# Patient Record
Sex: Male | Born: 1935 | Race: Black or African American | Hispanic: No | Smoking: Former smoker
Health system: Southern US, Community
[De-identification: ages and names within clinical notes are randomized; demographics above are authoritative.]

## PROBLEM LIST (undated history)

## (undated) DIAGNOSIS — S065X9A Traumatic subdural hemorrhage with loss of consciousness of unspecified duration, initial encounter: Secondary | ICD-10-CM

## (undated) DIAGNOSIS — S065XAA Traumatic subdural hemorrhage with loss of consciousness status unknown, initial encounter: Secondary | ICD-10-CM

## (undated) DIAGNOSIS — F32A Depression, unspecified: Secondary | ICD-10-CM

## (undated) DIAGNOSIS — M1990 Unspecified osteoarthritis, unspecified site: Secondary | ICD-10-CM

## (undated) DIAGNOSIS — I4891 Unspecified atrial fibrillation: Secondary | ICD-10-CM

## (undated) DIAGNOSIS — J069 Acute upper respiratory infection, unspecified: Secondary | ICD-10-CM

## (undated) DIAGNOSIS — K5790 Diverticulosis of intestine, part unspecified, without perforation or abscess without bleeding: Secondary | ICD-10-CM

## (undated) DIAGNOSIS — I10 Essential (primary) hypertension: Secondary | ICD-10-CM

## (undated) DIAGNOSIS — H269 Unspecified cataract: Secondary | ICD-10-CM

## (undated) DIAGNOSIS — I714 Abdominal aortic aneurysm, without rupture, unspecified: Secondary | ICD-10-CM

## (undated) DIAGNOSIS — I428 Other cardiomyopathies: Secondary | ICD-10-CM

## (undated) DIAGNOSIS — F329 Major depressive disorder, single episode, unspecified: Secondary | ICD-10-CM

## (undated) DIAGNOSIS — K219 Gastro-esophageal reflux disease without esophagitis: Secondary | ICD-10-CM

## (undated) DIAGNOSIS — I509 Heart failure, unspecified: Secondary | ICD-10-CM

## (undated) DIAGNOSIS — K5900 Constipation, unspecified: Secondary | ICD-10-CM

## (undated) DIAGNOSIS — R001 Bradycardia, unspecified: Secondary | ICD-10-CM

## (undated) HISTORY — PX: TONSILLECTOMY: SUR1361

## (undated) HISTORY — PX: OTHER SURGICAL HISTORY: SHX169

## (undated) HISTORY — PX: BRAIN SURGERY: SHX531

## (undated) HISTORY — PX: EYE SURGERY: SHX253

## (undated) HISTORY — PX: HERNIA REPAIR: SHX51

---

## 2000-07-09 ENCOUNTER — Encounter: Admission: RE | Admit: 2000-07-09 | Discharge: 2000-07-09 | Payer: Self-pay | Admitting: Cardiology

## 2000-07-09 ENCOUNTER — Encounter: Payer: Self-pay | Admitting: Cardiology

## 2001-01-23 ENCOUNTER — Ambulatory Visit (HOSPITAL_COMMUNITY): Admission: RE | Admit: 2001-01-23 | Discharge: 2001-01-23 | Payer: Self-pay | Admitting: Cardiology

## 2001-01-23 ENCOUNTER — Encounter: Payer: Self-pay | Admitting: Cardiology

## 2001-04-07 ENCOUNTER — Encounter (INDEPENDENT_AMBULATORY_CARE_PROVIDER_SITE_OTHER): Payer: Self-pay | Admitting: Specialist

## 2001-04-07 ENCOUNTER — Other Ambulatory Visit: Admission: RE | Admit: 2001-04-07 | Discharge: 2001-04-07 | Payer: Self-pay | Admitting: Urology

## 2001-07-16 ENCOUNTER — Ambulatory Visit (HOSPITAL_COMMUNITY): Admission: RE | Admit: 2001-07-16 | Discharge: 2001-07-16 | Payer: Self-pay | Admitting: *Deleted

## 2001-07-21 ENCOUNTER — Encounter: Payer: Self-pay | Admitting: *Deleted

## 2001-07-21 ENCOUNTER — Ambulatory Visit (HOSPITAL_COMMUNITY): Admission: RE | Admit: 2001-07-21 | Discharge: 2001-07-21 | Payer: Self-pay | Admitting: *Deleted

## 2001-08-31 ENCOUNTER — Emergency Department: Admission: EM | Admit: 2001-08-31 | Discharge: 2001-08-31 | Payer: Self-pay | Admitting: *Deleted

## 2002-10-11 ENCOUNTER — Encounter: Payer: Self-pay | Admitting: Cardiology

## 2002-10-11 ENCOUNTER — Encounter: Admission: RE | Admit: 2002-10-11 | Discharge: 2002-10-11 | Payer: Self-pay | Admitting: Cardiology

## 2003-01-20 ENCOUNTER — Inpatient Hospital Stay (HOSPITAL_COMMUNITY): Admission: EM | Admit: 2003-01-20 | Discharge: 2003-01-26 | Payer: Self-pay | Admitting: *Deleted

## 2003-01-20 ENCOUNTER — Encounter: Payer: Self-pay | Admitting: Cardiology

## 2003-01-21 ENCOUNTER — Encounter: Payer: Self-pay | Admitting: Cardiology

## 2003-01-22 ENCOUNTER — Encounter: Payer: Self-pay | Admitting: Cardiology

## 2003-01-24 ENCOUNTER — Encounter: Payer: Self-pay | Admitting: Cardiology

## 2003-03-03 ENCOUNTER — Encounter: Payer: Self-pay | Admitting: Emergency Medicine

## 2003-03-03 ENCOUNTER — Emergency Department (HOSPITAL_COMMUNITY): Admission: EM | Admit: 2003-03-03 | Discharge: 2003-03-03 | Payer: Self-pay | Admitting: Emergency Medicine

## 2003-04-02 ENCOUNTER — Encounter: Payer: Self-pay | Admitting: Emergency Medicine

## 2003-04-02 ENCOUNTER — Emergency Department (HOSPITAL_COMMUNITY): Admission: EM | Admit: 2003-04-02 | Discharge: 2003-04-02 | Payer: Self-pay | Admitting: Emergency Medicine

## 2003-06-23 ENCOUNTER — Emergency Department (HOSPITAL_COMMUNITY): Admission: EM | Admit: 2003-06-23 | Discharge: 2003-06-23 | Payer: Self-pay | Admitting: Emergency Medicine

## 2003-06-23 ENCOUNTER — Encounter: Payer: Self-pay | Admitting: Emergency Medicine

## 2003-09-17 ENCOUNTER — Encounter: Payer: Self-pay | Admitting: Emergency Medicine

## 2003-09-17 ENCOUNTER — Encounter: Payer: Self-pay | Admitting: Cardiology

## 2003-09-17 ENCOUNTER — Inpatient Hospital Stay (HOSPITAL_COMMUNITY): Admission: EM | Admit: 2003-09-17 | Discharge: 2003-09-22 | Payer: Self-pay | Admitting: Emergency Medicine

## 2003-10-30 ENCOUNTER — Emergency Department (HOSPITAL_COMMUNITY): Admission: AD | Admit: 2003-10-30 | Discharge: 2003-10-30 | Payer: Self-pay | Admitting: Emergency Medicine

## 2003-12-06 ENCOUNTER — Encounter: Admission: RE | Admit: 2003-12-06 | Discharge: 2003-12-06 | Payer: Self-pay | Admitting: Cardiology

## 2004-01-04 ENCOUNTER — Encounter: Admission: RE | Admit: 2004-01-04 | Discharge: 2004-01-04 | Payer: Self-pay | Admitting: Cardiology

## 2004-01-18 ENCOUNTER — Ambulatory Visit (HOSPITAL_COMMUNITY): Admission: RE | Admit: 2004-01-18 | Discharge: 2004-01-18 | Payer: Self-pay | Admitting: Cardiology

## 2004-01-21 ENCOUNTER — Encounter: Admission: RE | Admit: 2004-01-21 | Discharge: 2004-01-21 | Payer: Self-pay | Admitting: Cardiology

## 2004-01-21 ENCOUNTER — Ambulatory Visit (HOSPITAL_COMMUNITY): Admission: RE | Admit: 2004-01-21 | Discharge: 2004-01-21 | Payer: Self-pay | Admitting: Internal Medicine

## 2004-02-07 ENCOUNTER — Encounter: Admission: RE | Admit: 2004-02-07 | Discharge: 2004-02-07 | Payer: Self-pay | Admitting: Cardiology

## 2004-02-07 ENCOUNTER — Ambulatory Visit (HOSPITAL_COMMUNITY): Admission: RE | Admit: 2004-02-07 | Discharge: 2004-02-07 | Payer: Self-pay | Admitting: Cardiology

## 2004-07-02 ENCOUNTER — Emergency Department (HOSPITAL_COMMUNITY): Admission: EM | Admit: 2004-07-02 | Discharge: 2004-07-02 | Payer: Self-pay | Admitting: Emergency Medicine

## 2004-10-03 ENCOUNTER — Encounter: Admission: RE | Admit: 2004-10-03 | Discharge: 2004-10-03 | Payer: Self-pay | Admitting: Cardiology

## 2004-10-17 ENCOUNTER — Encounter: Admission: RE | Admit: 2004-10-17 | Discharge: 2004-10-17 | Payer: Self-pay | Admitting: Cardiology

## 2005-01-31 ENCOUNTER — Emergency Department (HOSPITAL_COMMUNITY): Admission: EM | Admit: 2005-01-31 | Discharge: 2005-01-31 | Payer: Self-pay | Admitting: Emergency Medicine

## 2005-02-13 ENCOUNTER — Encounter: Admission: RE | Admit: 2005-02-13 | Discharge: 2005-02-13 | Payer: Self-pay | Admitting: Cardiology

## 2005-04-24 ENCOUNTER — Emergency Department (HOSPITAL_COMMUNITY): Admission: EM | Admit: 2005-04-24 | Discharge: 2005-04-24 | Payer: Self-pay | Admitting: Emergency Medicine

## 2005-05-08 ENCOUNTER — Encounter: Admission: RE | Admit: 2005-05-08 | Discharge: 2005-05-08 | Payer: Self-pay | Admitting: Cardiology

## 2005-05-24 ENCOUNTER — Encounter: Admission: RE | Admit: 2005-05-24 | Discharge: 2005-05-24 | Payer: Self-pay | Admitting: Cardiology

## 2005-07-13 ENCOUNTER — Inpatient Hospital Stay (HOSPITAL_COMMUNITY): Admission: EM | Admit: 2005-07-13 | Discharge: 2005-07-17 | Payer: Self-pay | Admitting: Emergency Medicine

## 2005-08-30 ENCOUNTER — Emergency Department (HOSPITAL_COMMUNITY): Admission: EM | Admit: 2005-08-30 | Discharge: 2005-08-31 | Payer: Self-pay | Admitting: Emergency Medicine

## 2005-09-01 ENCOUNTER — Emergency Department (HOSPITAL_COMMUNITY): Admission: EM | Admit: 2005-09-01 | Discharge: 2005-09-02 | Payer: Self-pay | Admitting: Emergency Medicine

## 2005-09-03 ENCOUNTER — Inpatient Hospital Stay (HOSPITAL_COMMUNITY): Admission: EM | Admit: 2005-09-03 | Discharge: 2005-09-17 | Payer: Self-pay | Admitting: Emergency Medicine

## 2005-09-03 ENCOUNTER — Ambulatory Visit: Payer: Self-pay | Admitting: Physical Medicine & Rehabilitation

## 2005-09-03 ENCOUNTER — Ambulatory Visit (HOSPITAL_COMMUNITY): Admission: RE | Admit: 2005-09-03 | Discharge: 2005-09-03 | Payer: Self-pay | Admitting: Emergency Medicine

## 2005-09-04 ENCOUNTER — Encounter (INDEPENDENT_AMBULATORY_CARE_PROVIDER_SITE_OTHER): Payer: Self-pay | Admitting: *Deleted

## 2005-09-17 ENCOUNTER — Inpatient Hospital Stay (HOSPITAL_COMMUNITY)
Admission: RE | Admit: 2005-09-17 | Discharge: 2005-09-28 | Payer: Self-pay | Admitting: Physical Medicine & Rehabilitation

## 2005-11-21 ENCOUNTER — Ambulatory Visit (HOSPITAL_COMMUNITY): Admission: RE | Admit: 2005-11-21 | Discharge: 2005-11-21 | Payer: Self-pay | Admitting: Neurosurgery

## 2006-02-22 ENCOUNTER — Ambulatory Visit (HOSPITAL_COMMUNITY): Admission: RE | Admit: 2006-02-22 | Discharge: 2006-02-22 | Payer: Self-pay | Admitting: Neurosurgery

## 2006-03-06 ENCOUNTER — Inpatient Hospital Stay (HOSPITAL_COMMUNITY): Admission: RE | Admit: 2006-03-06 | Discharge: 2006-03-08 | Payer: Self-pay | Admitting: Neurosurgery

## 2006-04-04 ENCOUNTER — Encounter: Admission: RE | Admit: 2006-04-04 | Discharge: 2006-04-04 | Payer: Self-pay | Admitting: Cardiology

## 2006-04-18 ENCOUNTER — Encounter: Admission: RE | Admit: 2006-04-18 | Discharge: 2006-04-18 | Payer: Self-pay | Admitting: Cardiology

## 2006-10-15 ENCOUNTER — Encounter: Admission: RE | Admit: 2006-10-15 | Discharge: 2006-10-15 | Payer: Self-pay | Admitting: Cardiology

## 2007-02-07 ENCOUNTER — Encounter: Admission: RE | Admit: 2007-02-07 | Discharge: 2007-02-07 | Payer: Self-pay | Admitting: Cardiology

## 2007-05-12 ENCOUNTER — Encounter: Admission: RE | Admit: 2007-05-12 | Discharge: 2007-05-12 | Payer: Self-pay | Admitting: Cardiology

## 2007-07-18 ENCOUNTER — Encounter: Admission: RE | Admit: 2007-07-18 | Discharge: 2007-07-18 | Payer: Self-pay | Admitting: Cardiology

## 2007-09-24 ENCOUNTER — Encounter: Admission: RE | Admit: 2007-09-24 | Discharge: 2007-09-24 | Payer: Self-pay | Admitting: Cardiology

## 2007-11-11 ENCOUNTER — Encounter: Admission: RE | Admit: 2007-11-11 | Discharge: 2007-11-11 | Payer: Self-pay | Admitting: Cardiology

## 2007-12-29 ENCOUNTER — Encounter: Admission: RE | Admit: 2007-12-29 | Discharge: 2007-12-29 | Payer: Self-pay | Admitting: Cardiology

## 2008-03-25 ENCOUNTER — Encounter: Admission: RE | Admit: 2008-03-25 | Discharge: 2008-03-25 | Payer: Self-pay | Admitting: Cardiology

## 2008-06-22 ENCOUNTER — Encounter: Admission: RE | Admit: 2008-06-22 | Discharge: 2008-06-22 | Payer: Self-pay | Admitting: Cardiology

## 2008-08-01 ENCOUNTER — Emergency Department (HOSPITAL_COMMUNITY): Admission: EM | Admit: 2008-08-01 | Discharge: 2008-08-01 | Payer: Self-pay | Admitting: Emergency Medicine

## 2008-08-08 ENCOUNTER — Emergency Department (HOSPITAL_COMMUNITY): Admission: EM | Admit: 2008-08-08 | Discharge: 2008-08-08 | Payer: Self-pay | Admitting: Emergency Medicine

## 2008-10-14 ENCOUNTER — Encounter: Admission: RE | Admit: 2008-10-14 | Discharge: 2008-10-14 | Payer: Self-pay | Admitting: Cardiology

## 2009-01-07 ENCOUNTER — Encounter: Admission: RE | Admit: 2009-01-07 | Discharge: 2009-01-07 | Payer: Self-pay | Admitting: Cardiology

## 2009-03-05 ENCOUNTER — Emergency Department (HOSPITAL_COMMUNITY): Admission: EM | Admit: 2009-03-05 | Discharge: 2009-03-05 | Payer: Self-pay | Admitting: Emergency Medicine

## 2009-04-14 ENCOUNTER — Encounter: Admission: RE | Admit: 2009-04-14 | Discharge: 2009-04-14 | Payer: Self-pay | Admitting: Cardiology

## 2009-06-30 ENCOUNTER — Encounter: Admission: RE | Admit: 2009-06-30 | Discharge: 2009-06-30 | Payer: Self-pay | Admitting: Cardiology

## 2009-07-08 ENCOUNTER — Emergency Department (HOSPITAL_COMMUNITY): Admission: EM | Admit: 2009-07-08 | Discharge: 2009-07-08 | Payer: Self-pay | Admitting: Emergency Medicine

## 2009-09-04 ENCOUNTER — Emergency Department (HOSPITAL_COMMUNITY): Admission: EM | Admit: 2009-09-04 | Discharge: 2009-09-04 | Payer: Self-pay | Admitting: Emergency Medicine

## 2009-09-29 ENCOUNTER — Encounter: Admission: RE | Admit: 2009-09-29 | Discharge: 2009-09-29 | Payer: Self-pay | Admitting: Cardiology

## 2009-10-15 ENCOUNTER — Emergency Department (HOSPITAL_COMMUNITY): Admission: EM | Admit: 2009-10-15 | Discharge: 2009-10-15 | Payer: Self-pay | Admitting: Emergency Medicine

## 2009-12-25 ENCOUNTER — Emergency Department (HOSPITAL_COMMUNITY): Admission: EM | Admit: 2009-12-25 | Discharge: 2009-12-25 | Payer: Self-pay | Admitting: Emergency Medicine

## 2009-12-29 ENCOUNTER — Encounter: Admission: RE | Admit: 2009-12-29 | Discharge: 2009-12-29 | Payer: Self-pay | Admitting: Cardiology

## 2010-03-09 ENCOUNTER — Encounter: Admission: RE | Admit: 2010-03-09 | Discharge: 2010-03-09 | Payer: Self-pay | Admitting: Cardiology

## 2010-06-22 ENCOUNTER — Encounter: Admission: RE | Admit: 2010-06-22 | Discharge: 2010-06-22 | Payer: Self-pay | Admitting: Cardiology

## 2010-08-14 ENCOUNTER — Emergency Department (HOSPITAL_COMMUNITY): Admission: EM | Admit: 2010-08-14 | Discharge: 2010-08-14 | Payer: Self-pay | Admitting: Emergency Medicine

## 2010-09-06 ENCOUNTER — Encounter: Admission: RE | Admit: 2010-09-06 | Discharge: 2010-09-06 | Payer: Self-pay | Admitting: Cardiology

## 2010-11-16 ENCOUNTER — Emergency Department (HOSPITAL_COMMUNITY): Admission: EM | Admit: 2010-11-16 | Discharge: 2010-11-16 | Payer: Self-pay | Admitting: Emergency Medicine

## 2010-12-14 ENCOUNTER — Encounter
Admission: RE | Admit: 2010-12-14 | Discharge: 2010-12-14 | Payer: Self-pay | Source: Home / Self Care | Attending: Cardiology | Admitting: Cardiology

## 2011-02-17 ENCOUNTER — Emergency Department (HOSPITAL_COMMUNITY)
Admission: EM | Admit: 2011-02-17 | Discharge: 2011-02-17 | Disposition: A | Discharge status: Home / Self Care | Payer: Medicare Other | Attending: Emergency Medicine | Admitting: Emergency Medicine

## 2011-02-17 ENCOUNTER — Emergency Department (HOSPITAL_COMMUNITY): Payer: Medicare Other

## 2011-02-17 DIAGNOSIS — Z7982 Long term (current) use of aspirin: Secondary | ICD-10-CM | POA: Insufficient documentation

## 2011-02-17 DIAGNOSIS — R109 Unspecified abdominal pain: Secondary | ICD-10-CM | POA: Insufficient documentation

## 2011-02-17 DIAGNOSIS — I509 Heart failure, unspecified: Secondary | ICD-10-CM | POA: Insufficient documentation

## 2011-02-17 DIAGNOSIS — I10 Essential (primary) hypertension: Secondary | ICD-10-CM | POA: Insufficient documentation

## 2011-02-17 DIAGNOSIS — R110 Nausea: Secondary | ICD-10-CM | POA: Insufficient documentation

## 2011-02-17 DIAGNOSIS — E785 Hyperlipidemia, unspecified: Secondary | ICD-10-CM | POA: Insufficient documentation

## 2011-02-17 DIAGNOSIS — I4891 Unspecified atrial fibrillation: Secondary | ICD-10-CM | POA: Insufficient documentation

## 2011-02-17 DIAGNOSIS — K219 Gastro-esophageal reflux disease without esophagitis: Secondary | ICD-10-CM | POA: Insufficient documentation

## 2011-02-17 DIAGNOSIS — K5900 Constipation, unspecified: Secondary | ICD-10-CM | POA: Insufficient documentation

## 2011-02-17 DIAGNOSIS — Z79899 Other long term (current) drug therapy: Secondary | ICD-10-CM | POA: Insufficient documentation

## 2011-02-17 DIAGNOSIS — M549 Dorsalgia, unspecified: Secondary | ICD-10-CM | POA: Insufficient documentation

## 2011-02-17 DIAGNOSIS — I2510 Atherosclerotic heart disease of native coronary artery without angina pectoris: Secondary | ICD-10-CM | POA: Insufficient documentation

## 2011-02-17 DIAGNOSIS — E7800 Pure hypercholesterolemia, unspecified: Secondary | ICD-10-CM | POA: Insufficient documentation

## 2011-02-17 DIAGNOSIS — G8929 Other chronic pain: Secondary | ICD-10-CM | POA: Insufficient documentation

## 2011-02-17 LAB — COMPREHENSIVE METABOLIC PANEL
AST: 36 U/L (ref 0–37)
Albumin: 3.4 g/dL — ABNORMAL LOW (ref 3.5–5.2)
Alkaline Phosphatase: 56 U/L (ref 39–117)
Calcium: 8.8 mg/dL (ref 8.4–10.5)
Chloride: 109 mEq/L (ref 96–112)
Creatinine, Ser: 1.05 mg/dL (ref 0.4–1.5)
Sodium: 141 mEq/L (ref 135–145)
Total Protein: 6.3 g/dL (ref 6.0–8.3)

## 2011-02-17 LAB — DIFFERENTIAL
Basophils Absolute: 0 10*3/uL (ref 0.0–0.1)
Basophils Relative: 1 % (ref 0–1)
Eosinophils Absolute: 0.2 10*3/uL (ref 0.0–0.7)
Lymphocytes Relative: 53 % — ABNORMAL HIGH (ref 12–46)
Lymphs Abs: 1.5 10*3/uL (ref 0.7–4.0)
Neutro Abs: 0.8 10*3/uL — ABNORMAL LOW (ref 1.7–7.7)

## 2011-02-17 LAB — CBC
HCT: 39 % (ref 39.0–52.0)
MCH: 25.6 pg — ABNORMAL LOW (ref 26.0–34.0)
RDW: 16.8 % — ABNORMAL HIGH (ref 11.5–15.5)
WBC: 3 10*3/uL — ABNORMAL LOW (ref 4.0–10.5)

## 2011-02-17 LAB — POCT CARDIAC MARKERS
CKMB, poc: <1 ng/mL — ABNORMAL LOW (ref 1.0–8.0)
Troponin i, poc: <0.05 ng/mL (ref 0.00–0.09)

## 2011-02-17 LAB — LIPASE, BLOOD: Lipase: 23 U/L (ref 11–59)

## 2011-02-19 LAB — PATHOLOGIST SMEAR REVIEW

## 2011-03-06 LAB — URINALYSIS, ROUTINE W REFLEX MICROSCOPIC
Ketones, ur: NEGATIVE mg/dL
Specific Gravity, Urine: 1.012 (ref 1.005–1.030)

## 2011-03-06 LAB — COMPREHENSIVE METABOLIC PANEL
CO2: 23 mEq/L (ref 19–32)
Calcium: 9 mg/dL (ref 8.4–10.5)
Glucose, Bld: 97 mg/dL (ref 70–99)
Total Bilirubin: 1.3 mg/dL — ABNORMAL HIGH (ref 0.3–1.2)
Total Protein: 7.3 g/dL (ref 6.0–8.3)

## 2011-03-06 LAB — CBC
HCT: 40.9 % (ref 39.0–52.0)
MCH: 25.7 pg — ABNORMAL LOW (ref 26.0–34.0)
MCHC: 34 g/dL (ref 30.0–36.0)
MCV: 75.6 fL — ABNORMAL LOW (ref 78.0–100.0)
RBC: 5.41 MIL/uL (ref 4.22–5.81)
RDW: 17 % — ABNORMAL HIGH (ref 11.5–15.5)
WBC: 5.1 10*3/uL (ref 4.0–10.5)

## 2011-03-06 LAB — POCT CARDIAC MARKERS: Troponin i, poc: <0.05 ng/mL (ref 0.00–0.09)

## 2011-03-06 LAB — LIPASE, BLOOD: Lipase: 31 U/L (ref 11–59)

## 2011-03-07 ENCOUNTER — Other Ambulatory Visit: Payer: Self-pay | Admitting: Cardiology

## 2011-03-07 DIAGNOSIS — M549 Dorsalgia, unspecified: Secondary | ICD-10-CM

## 2011-03-08 ENCOUNTER — Ambulatory Visit
Admission: RE | Admit: 2011-03-08 | Discharge: 2011-03-08 | Disposition: A | Discharge status: Home / Self Care | Payer: Medicare Other | Source: Ambulatory Visit | Attending: Cardiology | Admitting: Cardiology

## 2011-03-08 DIAGNOSIS — M549 Dorsalgia, unspecified: Secondary | ICD-10-CM

## 2011-03-08 LAB — CBC
HCT: 39.6 % (ref 39.0–52.0)
Hemoglobin: 13.8 g/dL (ref 13.0–17.0)
MCH: 25.8 pg — ABNORMAL LOW (ref 26.0–34.0)
MCHC: 34.8 g/dL (ref 30.0–36.0)
MCV: 74.2 fL — ABNORMAL LOW (ref 78.0–100.0)

## 2011-03-08 LAB — DIFFERENTIAL
Basophils Relative: 1 % (ref 0–1)
Eosinophils Absolute: 0.1 10*3/uL (ref 0.0–0.7)
Eosinophils Relative: 4 % (ref 0–5)
Lymphs Abs: 1.9 10*3/uL (ref 0.7–4.0)
Monocytes Absolute: 0.4 10*3/uL (ref 0.1–1.0)
Monocytes Relative: 12 % (ref 3–12)
Neutrophils Relative %: 28 % — ABNORMAL LOW (ref 43–77)

## 2011-03-08 LAB — POCT CARDIAC MARKERS
CKMB, poc: 1.5 ng/mL (ref 1.0–8.0)
Troponin i, poc: <0.05 ng/mL (ref 0.00–0.09)

## 2011-03-08 LAB — POCT I-STAT, CHEM 8
BUN: 8 mg/dL (ref 6–23)
Calcium, Ion: 1.07 mmol/L — ABNORMAL LOW (ref 1.12–1.32)
Chloride: 110 mEq/L (ref 96–112)
Creatinine, Ser: 1.2 mg/dL (ref 0.4–1.5)
Glucose, Bld: 90 mg/dL (ref 70–99)
Potassium: 3.1 mEq/L — ABNORMAL LOW (ref 3.5–5.1)

## 2011-03-29 LAB — CBC
Hemoglobin: 14 g/dL (ref 13.0–17.0)
MCHC: 32.7 g/dL (ref 30.0–36.0)
RBC: 5.22 MIL/uL (ref 4.22–5.81)
RDW: 18.4 % — ABNORMAL HIGH (ref 11.5–15.5)

## 2011-03-29 LAB — COMPREHENSIVE METABOLIC PANEL
ALT: 19 U/L (ref 0–53)
AST: 26 U/L (ref 0–37)
CO2: 24 mEq/L (ref 19–32)
Calcium: 8.3 mg/dL — ABNORMAL LOW (ref 8.4–10.5)
GFR calc Af Amer: >60 mL/min (ref >60)
GFR calc non Af Amer: >60 mL/min (ref >60)
Glucose, Bld: 93 mg/dL (ref 70–99)
Potassium: 3.5 mEq/L (ref 3.5–5.1)
Sodium: 139 mEq/L (ref 135–145)
Total Bilirubin: 1.1 mg/dL (ref 0.3–1.2)
Total Protein: 6.4 g/dL (ref 6.0–8.3)

## 2011-03-29 LAB — HEMOCCULT GUIAC POC 1CARD (OFFICE): Fecal Occult Bld: POSITIVE

## 2011-03-29 LAB — DIFFERENTIAL
Basophils Absolute: 0 10*3/uL (ref 0.0–0.1)
Lymphocytes Relative: 28 % (ref 12–46)
Monocytes Absolute: 0.4 10*3/uL (ref 0.1–1.0)
Neutro Abs: 2.4 10*3/uL (ref 1.7–7.7)

## 2011-03-30 LAB — POCT CARDIAC MARKERS
Myoglobin, poc: 209 ng/mL (ref 12–200)
Myoglobin, poc: 219 ng/mL (ref 12–200)
Troponin i, poc: <0.05 ng/mL (ref 0.00–0.09)
Troponin i, poc: <0.05 ng/mL (ref 0.00–0.09)

## 2011-03-30 LAB — DIFFERENTIAL
Basophils Absolute: 0 K/uL (ref 0.0–0.1)
Basophils Relative: 0 % (ref 0–1)
Eosinophils Absolute: 0.1 10*3/uL (ref 0.0–0.7)
Eosinophils Relative: 3 % (ref 0–5)
Lymphocytes Relative: 41 % (ref 12–46)
Lymphs Abs: 1.5 K/uL (ref 0.7–4.0)
Monocytes Absolute: 0.3 K/uL (ref 0.1–1.0)
Monocytes Relative: 8 % (ref 3–12)
Neutro Abs: 1.8 K/uL (ref 1.7–7.7)
Neutrophils Relative %: 48 % (ref 43–77)

## 2011-03-30 LAB — COMPREHENSIVE METABOLIC PANEL
ALT: 16 U/L (ref 0–53)
AST: 28 U/L (ref 0–37)
CO2: 23 mEq/L (ref 19–32)
Chloride: 109 mEq/L (ref 96–112)
GFR calc Af Amer: >60 mL/min (ref >60)
GFR calc non Af Amer: >60 mL/min (ref >60)
Potassium: 3.4 mEq/L — ABNORMAL LOW (ref 3.5–5.1)
Sodium: 141 mEq/L (ref 135–145)

## 2011-03-30 LAB — COMPREHENSIVE METABOLIC PANEL WITH GFR
Albumin: 3.8 g/dL (ref 3.5–5.2)
Alkaline Phosphatase: 53 U/L (ref 39–117)
BUN: 10 mg/dL (ref 6–23)
Calcium: 8.8 mg/dL (ref 8.4–10.5)
Creatinine, Ser: 1.08 mg/dL (ref 0.4–1.5)
Glucose, Bld: 91 mg/dL (ref 70–99)
Total Bilirubin: 1 mg/dL (ref 0.3–1.2)
Total Protein: 6.9 g/dL (ref 6.0–8.3)

## 2011-03-30 LAB — CBC
HCT: 44.3 % (ref 39.0–52.0)
Hemoglobin: 14.2 g/dL (ref 13.0–17.0)
MCHC: 32 g/dL (ref 30.0–36.0)
MCV: 81.3 fL (ref 78.0–100.0)
Platelets: 219 K/uL (ref 150–400)
RBC: 5.44 MIL/uL (ref 4.22–5.81)
RDW: 18.5 % — ABNORMAL HIGH (ref 11.5–15.5)
WBC: 3.8 K/uL — ABNORMAL LOW (ref 4.0–10.5)

## 2011-03-30 LAB — LIPASE, BLOOD: Lipase: 18 U/L (ref 11–59)

## 2011-03-30 LAB — BRAIN NATRIURETIC PEPTIDE: Pro B Natriuretic peptide (BNP): 192 pg/mL — ABNORMAL HIGH (ref 0.0–100.0)

## 2011-03-30 LAB — PROTIME-INR
INR: 1.2 (ref 0.00–1.49)
Prothrombin Time: 14.6 seconds (ref 11.6–15.2)

## 2011-03-30 LAB — APTT: aPTT: 31 s (ref 24–37)

## 2011-04-01 LAB — COMPREHENSIVE METABOLIC PANEL
AST: 26 U/L (ref 0–37)
Albumin: 3.5 g/dL (ref 3.5–5.2)
Calcium: 8.6 mg/dL (ref 8.4–10.5)
Chloride: 108 mEq/L (ref 96–112)
Creatinine, Ser: 0.98 mg/dL (ref 0.4–1.5)
GFR calc Af Amer: >60 mL/min (ref >60)
Total Bilirubin: 1 mg/dL (ref 0.3–1.2)
Total Protein: 6.4 g/dL (ref 6.0–8.3)

## 2011-04-01 LAB — DIFFERENTIAL
Basophils Relative: 2 % — ABNORMAL HIGH (ref 0–1)
Eosinophils Relative: 3 % (ref 0–5)
Monocytes Absolute: 0.3 10*3/uL (ref 0.1–1.0)
Monocytes Relative: 8 % (ref 3–12)
Neutro Abs: 1.7 10*3/uL (ref 1.7–7.7)

## 2011-04-01 LAB — CBC
MCV: 79.9 fL (ref 78.0–100.0)
Platelets: 220 10*3/uL (ref 150–400)
RDW: 18 % — ABNORMAL HIGH (ref 11.5–15.5)
WBC: 3.8 10*3/uL — ABNORMAL LOW (ref 4.0–10.5)

## 2011-04-01 LAB — POCT CARDIAC MARKERS: Myoglobin, poc: 123 ng/mL (ref 12–200)

## 2011-04-05 LAB — COMPREHENSIVE METABOLIC PANEL
AST: 27 U/L (ref 0–37)
Albumin: 3.3 g/dL — ABNORMAL LOW (ref 3.5–5.2)
Calcium: 8.8 mg/dL (ref 8.4–10.5)
Creatinine, Ser: 1.09 mg/dL (ref 0.4–1.5)
GFR calc Af Amer: >60 mL/min (ref >60)

## 2011-04-05 LAB — URINALYSIS, ROUTINE W REFLEX MICROSCOPIC
Hgb urine dipstick: NEGATIVE
Nitrite: NEGATIVE
Specific Gravity, Urine: 1.024 (ref 1.005–1.030)
pH: 6 (ref 5.0–8.0)

## 2011-04-05 LAB — DIFFERENTIAL
Eosinophils Relative: 8 % — ABNORMAL HIGH (ref 0–5)
Lymphocytes Relative: 46 % (ref 12–46)
Lymphs Abs: 1.4 10*3/uL (ref 0.7–4.0)
Monocytes Absolute: 0.3 10*3/uL (ref 0.1–1.0)
Monocytes Relative: 10 % (ref 3–12)

## 2011-04-05 LAB — CBC
HCT: 41.8 % (ref 39.0–52.0)
Platelets: 195 10*3/uL (ref 150–400)
WBC: 2.9 10*3/uL — ABNORMAL LOW (ref 4.0–10.5)

## 2011-04-05 LAB — POCT CARDIAC MARKERS

## 2011-04-05 LAB — CK TOTAL AND CKMB (NOT AT ARMC)
Relative Index: 2.3 (ref 0.0–2.5)
Total CK: 147 U/L (ref 7–232)

## 2011-04-05 LAB — URINE MICROSCOPIC-ADD ON

## 2011-04-05 LAB — BRAIN NATRIURETIC PEPTIDE: Pro B Natriuretic peptide (BNP): 268 pg/mL — ABNORMAL HIGH (ref 0.0–100.0)

## 2011-05-01 ENCOUNTER — Emergency Department (HOSPITAL_COMMUNITY)
Admission: EM | Admit: 2011-05-01 | Discharge: 2011-05-01 | Disposition: A | Discharge status: Home / Self Care | Payer: Medicare Other | Attending: Emergency Medicine | Admitting: Emergency Medicine

## 2011-05-01 ENCOUNTER — Other Ambulatory Visit: Payer: Self-pay | Admitting: Cardiology

## 2011-05-01 ENCOUNTER — Emergency Department (HOSPITAL_COMMUNITY): Payer: Medicare Other

## 2011-05-01 DIAGNOSIS — I509 Heart failure, unspecified: Secondary | ICD-10-CM | POA: Insufficient documentation

## 2011-05-01 DIAGNOSIS — M549 Dorsalgia, unspecified: Secondary | ICD-10-CM

## 2011-05-01 DIAGNOSIS — K3189 Other diseases of stomach and duodenum: Secondary | ICD-10-CM | POA: Insufficient documentation

## 2011-05-01 DIAGNOSIS — R079 Chest pain, unspecified: Secondary | ICD-10-CM | POA: Insufficient documentation

## 2011-05-01 DIAGNOSIS — I4891 Unspecified atrial fibrillation: Secondary | ICD-10-CM | POA: Insufficient documentation

## 2011-05-01 DIAGNOSIS — G8929 Other chronic pain: Secondary | ICD-10-CM | POA: Insufficient documentation

## 2011-05-01 DIAGNOSIS — I2510 Atherosclerotic heart disease of native coronary artery without angina pectoris: Secondary | ICD-10-CM | POA: Insufficient documentation

## 2011-05-01 DIAGNOSIS — E785 Hyperlipidemia, unspecified: Secondary | ICD-10-CM | POA: Insufficient documentation

## 2011-05-01 DIAGNOSIS — I10 Essential (primary) hypertension: Secondary | ICD-10-CM | POA: Insufficient documentation

## 2011-05-01 DIAGNOSIS — E7800 Pure hypercholesterolemia, unspecified: Secondary | ICD-10-CM | POA: Insufficient documentation

## 2011-05-01 DIAGNOSIS — K219 Gastro-esophageal reflux disease without esophagitis: Secondary | ICD-10-CM | POA: Insufficient documentation

## 2011-05-01 DIAGNOSIS — Z79899 Other long term (current) drug therapy: Secondary | ICD-10-CM | POA: Insufficient documentation

## 2011-05-01 DIAGNOSIS — Z7982 Long term (current) use of aspirin: Secondary | ICD-10-CM | POA: Insufficient documentation

## 2011-05-01 LAB — URINALYSIS, ROUTINE W REFLEX MICROSCOPIC
Glucose, UA: NEGATIVE mg/dL
Nitrite: NEGATIVE
Specific Gravity, Urine: 1.009 (ref 1.005–1.030)
pH: 7.5 (ref 5.0–8.0)

## 2011-05-01 LAB — CBC
HCT: 37 % — ABNORMAL LOW (ref 39.0–52.0)
Hemoglobin: 12.6 g/dL — ABNORMAL LOW (ref 13.0–17.0)
MCH: 25.1 pg — ABNORMAL LOW (ref 26.0–34.0)
MCHC: 34.1 g/dL (ref 30.0–36.0)
MCV: 73.9 fL — ABNORMAL LOW (ref 78.0–100.0)

## 2011-05-01 LAB — DIFFERENTIAL
Basophils Relative: 1 % (ref 0–1)
Lymphs Abs: 1.2 10*3/uL (ref 0.7–4.0)
Monocytes Absolute: 0.4 10*3/uL (ref 0.1–1.0)
Monocytes Relative: 16 % — ABNORMAL HIGH (ref 3–12)
Neutro Abs: 1.1 10*3/uL — ABNORMAL LOW (ref 1.7–7.7)

## 2011-05-01 LAB — POCT I-STAT, CHEM 8
BUN: 5 mg/dL — ABNORMAL LOW (ref 6–23)
Creatinine, Ser: 1.1 mg/dL (ref 0.4–1.5)
Sodium: 144 mEq/L (ref 135–145)
TCO2: 25 mmol/L (ref 0–100)

## 2011-05-01 LAB — POCT CARDIAC MARKERS
CKMB, poc: 1.3 ng/mL (ref 1.0–8.0)
Troponin i, poc: <0.05 ng/mL (ref 0.00–0.09)

## 2011-05-02 LAB — URINE CULTURE

## 2011-05-04 ENCOUNTER — Ambulatory Visit
Admission: RE | Admit: 2011-05-04 | Discharge: 2011-05-04 | Disposition: A | Discharge status: Home / Self Care | Payer: Medicare Other | Source: Ambulatory Visit | Attending: Cardiology | Admitting: Cardiology

## 2011-05-04 DIAGNOSIS — M549 Dorsalgia, unspecified: Secondary | ICD-10-CM

## 2011-05-11 NOTE — Discharge Summary (Signed)
[name] [name], [name] [name] NO.:  [id number]   MEDICAL RECORD NO.:  [id number]          PATIENT TYPE:  IPS   LOCATION:  4034                         FACILITY:  [facility]   PHYSICIAN:  Ranelle Oyster, M.D.DATE OF BIRTH:  [birth year]   DATE OF ADMISSION:  09/17/2005  DATE OF DISCHARGE:  09/28/2005                                 DISCHARGE SUMMARY   DATE OF TRANSFER:  Discharged September 28, 2005, to extended care facility.   DISCHARGE DIAGNOSES:  1.  Right subdural hematoma.  2.  Pain management.  3.  Seizure prophylaxis.  4.  Hypertension.  5.  Atrial fibrillation.  6.  Hyperlipidemia.  7.  Congestive heart failure.  8.  Depression.   PROCEDURES:  Status post craniotomy evacuation of subdural hematoma  September 03, 2005, for Dr. Newell Coral.   HISTORY OF PRESENT ILLNESS:  An 77 year old right handed black male, recent  fall 5 days prior.  Presented September 11 with left sided weakness.  MRI  with large right hemispheric subdural hematoma with right to left shift.  Underwent right frontal temporal parietal craniotomy with evacuation of  subdural hematoma September 11 with Dr. Newell Coral.  Noted patient to be on  chronic Coumadin prior to admission for atrial fibrillation which was held  secondary to subdural hematoma.  Placed on Dilantin for seizure prophylaxis.  Follow up cranial CT scan September 18 with no signs of hydrocephalus.  Patient with right shoulder pain.  X-rays negative.  No chest pain.  No  nausea or vomiting.  He was admitted for comprehensive rehab program.   PAST MEDICAL HISTORY:  See discharge diagnoses.  No alcohol.  Remote smoker.   ALLERGIES:  None.   SOCIAL HISTORY:  Lives alone.  Daughter in area works.  No other local  family.  An 75 year old sister from [place] to provide supervision x2 weeks  after discharge.  They live in a one level home with two steps to entry.   MEDICATIONS PRIOR TO ADMISSION:  1.  Vicodin as needed.  2.  Lasix 20  mg daily.  3.  Zelnorm 6 mg b.i.d.  4.  Coumadin 5 mg daily.  5.  Protonix 40 mg daily.  6.  Imdur 40 mg t.i.d.  7.  Diovan 80 mg daily.  8.  Potassium chloride 20 mEq daily.  9.  Lipitor 10 mg daily.  10. BiDil 20/37.5 mg t.i.d.   HOSPITAL COURSE:  Patient with progressive gains while on rehab services  with therapies initiated on a b.i.d. basis.  The following issues are  followed during the patient's rehab course.  Pertaining to Mr. Alvira' right  subdural hematoma with craniotomy evacuation of subdural hematoma September 03, 2005, surgical site healing nicely.  Staples have been removed.  No  signs of infection.  Follow up cranial CT scan October 3 showed still with  some mild mass effect from the right frontal parietal subacute chronic  subdural hematoma.  Overall, improved.  He remained on Dilantin for seizure  prophylaxis, but no seizure activity noted.  Latest dilantin level of 11 on  September 10, 2005.  He was moving all extremities except some mild left  sided weakness.  He was attending therapies.  Strength and endurance had  greatly improved.  He was supervision for his activities of daily living,  ambulating household distances with some cues.  Memory still remained poor.  He was 75% accurate for time, place.  He needed some cues to answer complex  yes or no questions.  He was able to follow simple two step commands.  He was placed on trials of Lexapro on September 24, 2005, and monitored.  His  blood pressures remained stable with diastolic's 59-69 as he continued on  his Avapro, BiDil, Coreg and Lasix.  He did have a small skin abrasion to  his lower abdominal region that was receiving Bacitracin ointment daily as  needed.  Routine toileting schedules were provided.  Noted history of atrial  fibrillation.  Coumadin had been on hold secondary to subdural hematoma.  His cardiac rate remained monitored and controlled.  He would follow with  Dr. Newell Coral on anticipation on  when and if Coumadin could be resumed.  The  patient was discharged in stable condition.   LABORATORY DATA:  Latest labs showed a hemoglobin of 11.5, hematocrit 35.4,  platelets 364,000.  Sodium 134, potassium 3.9, BUN 7, creatinine 0.9.   DISCHARGE MEDICATIONS:  1.  Zelnorm 6 mg p.o. b.i.d.  2.  Protonix 40 mg p.o. daily.  3.  BiDil 20/37.5 mg one tablet t.i.d.  4.  Diovan 80 mg p.o. daily.  5.  Potassium chloride 20 mEq p.o. daily.  6.  Lipitor 10 mg daily.  7.  Coreg 3.125 mg p.o. b.i.d.  8.  Dilantin 200 mg p.o. b.i.d.  9.  Lasix 20 mg p.o. daily.  10. Desyrel 50 mg q.h.s. for sleep which patient may refuse.  11. Lexapro 5 mg p.o. q.h.s.  12. Tylenol 650 mg p.o. q.4 h p.r.n. pain.  13. Vicodin 5/500 1-2 tablets q.4 h p.r.n. pain.   DISCHARGE INSTRUCTIONS:  1.  Activity:  As tolerated with supervision for safety.  2.  Diet:  Regular.   SPECIAL INSTRUCTIONS:  Continue therapies to promote overall mobility and  well being.  The patient should follow up with Dr. Newell Coral, Neurosurgery,  [phone], call for appointment; Dr. Faith Rogue, [phone], as advised.      Mariam Dollar, P.A.      Ranelle Oyster, M.D.  Electronically Signed    DA/MEDQ  D:  09/27/2005  T:  09/27/2005  Job:  [id number]   cc:   Ranelle Oyster, M.D.  Fax: [phone]   Osvaldo Shipper. Spruill, M.D.  Fax: [phone]   Hewitt Shorts, M.D.  Fax: [phone]

## 2011-05-11 NOTE — Cardiovascular Report (Signed)
NAME:  [name] [name], [name] [name] NO.:  [id number]   MEDICAL RECORD NO.:  [id number]                   PATIENT TYPE:  INP   LOCATION:  3707                                 FACILITY:  [facility]   PHYSICIAN:  Mohan N. Sharyn Lull, M.D.              DATE OF BIRTH:  25-Mar-1923   DATE OF PROCEDURE:  01/25/2003  DATE OF DISCHARGE:                              CARDIAC CATHETERIZATION   PROCEDURE:  Left cardiac catheterization with selective left and right  coronary angiography, left ventricular angiography via right groin using  Judkins technique.   INDICATIONS FOR PROCEDURE:  The patient is a 77 year old black male with a  past medical history significant for hypertension.  Was admitted on January 20, 2003, because of burning, mid epigastric chest pain without any  associated symptoms.  The patient was seen in the emergency room and was  subsequently noted to have elevated troponin-I.  The patient subsequently  underwent Persantine Cardiolite on January 29 which showed no evidence of  vessel ischemia with normal hypokinesia with EF of 34%.  The patient also  had an episode of atrial flutter with variable block and sinus bradycardia  with 2.8-second pause.  Due to chest pain and slightly elevated troponin-I  and markedly reduced LV systolic function and multiple risk factors, the  patient was discussed regarding left catheterization, possible PCI, its  risks, i.e., death, MI, stroke, need for emergency CABG, risk of restenosis,  local vascular complications, etc., and consented for PCI.   DESCRIPTION OF PROCEDURE:  After obtaining the informed consent, the patient  was brought to the catheterization lab and was placed on the fluoroscopy  table.  The right groin was prepped and draped in the usual fashion.  Xylocaine, 2%, was used for local anesthesia in the right groin.  With the  help of a thin-walled needle, a 6-French arterial sheath was placed.  The  sheath was  aspirated and flushed.  Next, a 6-French left Judkins catheter  was advanced over the wire under fluoroscopic guidance up to the ascending  aorta.  The wire was pulled out.  The catheter was aspirated and connected  to the manifold.  The catheter was further advanced and engaged into the  left coronary ostium.  Multiple views of the left system were taken.  Next,  the catheter was disengaged and was pulled out over the wire and was  replaced with a 6-French right Judkins catheter which was advanced over the  wire under fluoroscopic guidance up to the ascending aorta.  The wire was  pulled out.  The catheter was aspirated and connected to the manifold.  The  catheter was further advanced and engaged into the right coronary ostium.  Multiple views of the right system were taken.  Next, the catheter was  disengaged and was pulled out over the wire, and was replaced with a 6-  French pigtail catheter which was  advanced over the wire under fluoroscopic  guidance up to the ascending aorta.  The catheter was further advanced above  the aortic valve into the LV.  LV pressures were recorded.  Next, left  ventriculography was done in 30-degree RAO position.  Post angiographic  pressures were recorded from LV, and then pullback pressures were recorded  from the aorta.  There was no gradient across the aortic valve.  Next, the  pigtail catheter was pulled out over the wire.  Sheaths were aspirated and  flushed.   FINDINGS:  1. Left ventricle was moderately enlarged.  There was global hypokinesia     with akinesia of the inferior wall.  Ejection fraction of 25-30%.  2. The left main was patent.  3. The left anterior descending artery was patent although the flow in the     left anterior descending artery, as compared to left circumflex, was     diminished, barely enough to make the 2+ range.  Diagonal #1 and diagonal     #2 are patent.  4. The left circumflex has 20-30% mid stenosis.  The obtuse  marginal #1 was     patent.  The obtuse marginal #2 was very, very small which was diffusely     diseased.  The obtuse marginal #3 was patent.  5. The right coronary artery was patent proximally and in the mid portion.     The posterior descending artery had 80% focal distal stenosis supplying     only a small area of myocardium.  The patient has codominant coronary     system.   Arteriotomy was closed with Perclose without any complications.  The patient  tolerated the procedure well.  There were no complications.  The patient was  transferred to the recovery room in stable condition.   PLAN:  Restart Lovenox and Coumadin tonight if stable.  The patient will be  treated medically at this point and may need EP evaluation of this patient  if he remains in atrial flutter for possible flutter ablation in the future.                                               Eduardo Osier. Sharyn Lull, M.D.    MNH/MEDQ  D:  01/25/2003  T:  01/25/2003  Job:  [id number]   cc:   Cardiac Catheterization Lab   Osvaldo Shipper. Spruill, M.D.  [address] [address]  [place]  [place] [address]  Fax: [phone]

## 2011-05-11 NOTE — H&P (Signed)
NAME:  SID, GREENER NO.:  [id number]   MEDICAL RECORD NO.:  [id number]          PATIENT TYPE:  INP   LOCATION:  3109                         FACILITY:  [facility]   PHYSICIAN:  Hewitt Shorts, M.D.DATE OF BIRTH:  [birth year]   DATE OF ADMISSION:  09/03/2005  DATE OF DISCHARGE:                                HISTORY & PHYSICAL   HISTORY OF PRESENT ILLNESS:  Patient is an 77 year old right-handed black  male who fell five days ago and subsequently developed weakness on his left  side.   Patient and his daughter, Steward Drone, who accompanied him explained that he  initially had soreness on his right knee and in his back.  He went to the  emergency room the day following his fall and was evaluated with EKG and  laboratories.  They were in the emergency room for 12 hours and he was told  that he had arthritis in his knee and back.   The patient and his daughter returned three days following his fall because  he was sluggishly moving the left side, he was weak on the left side.  He  had to come into the emergency room in a wheelchair.  He had disorientation,  incontinence, and imbalance.  Patient was evaluated by the emergency room  physician and spent six hours in the emergency room and they arranged for an  MRI of the brain to be done today as an outpatient.   Patient went for the MRI scan here at [facility] earlier today and  because of findings on the study was transferred to the emergency room.  The  study showed a large right hemispheric subdural hematoma with significant  right-to-left shift.   Patient and his daughter denied any headaches, diplopia, blurred vision,  nausea, vomiting, or seizures.  The primary difficulty is of significant  weakness on the left side.  His daughter feels that he may have impaired  vision on the left side since he does not seem to see her when she sits on  the left side of the bed and does seem to see her when she sits on  the right  side of the bed.  He does complain of some aching in his right shoulder.   Patient was evaluated by Dr. Deretha Emory, the emergency room physician, and  neurosurgery consultation was requested.   PAST MEDICAL HISTORY:  Notable for history of hypertension, congestive heart  failure, hypercholesterolemia, gastroesophageal reflux disease.  His primary  physician is Dr. Donia Guiles.   MEDICATIONS:  Apparently, at the time of admission include Lipitor, Lasix,  Coumadin, Protonix, potassium chloride, Dyazide, and a pain pill.   There is apparently no history of myocardial infarction, cancer, stroke,  diabetes, or lung disease.  No previous surgeries.  No known allergies.  Medications are as listed above.   FAMILY HISTORY:  His mother died at age 42.  His father died age 62.  He is  really unsure of any of their significant medical conditions.   SOCIAL HISTORY:  Patient has never married.  He quit smoking about 10 years  ago.  He does not drink now and he never drank heavily.  He lives alone.  He  has two daughters, Steward Drone who is here today with him is one of them.   REVIEW OF SYSTEMS:  Notable for those described in his History of Present  Illness, Past Medical History, but is otherwise unremarkable.   PHYSICAL EXAMINATION:  GENERAL:  Well-developed, well-nourished black male  in no acute distress.  VITAL SIGNS:  Temperature 97.1, pulse 54, blood pressure 111/65, respiratory  rate 16.  LUNGS:  Clear to auscultation.  He has symmetrical respiratory excursion.  HEART:  Regular rate and rhythm with S1, S2.  There is no murmur.  ABDOMEN:  Soft, nondistended.  Bowel sounds are present.  EXTREMITIES:  No clubbing, cyanosis, edema.  NEUROLOGIC:  Mental status:  Patient is awake, alert.  He is oriented to his  name, [facility], it is the ninth month, and with prompting to  2006.  He follows commands.  His speech is fluent and he has good  comprehension.  Cranial nerves  show pupils are equal, round, and reactive to  light.  Extraocular movements are intact.  He has significant left facial  weakness.  Hearing is present.  Palatal movement is symmetrical.  Shoulder  shrug is symmetrical and tongue is midline.  Motor examination shows a  significant left hemiparesis in both the upper and lower extremities.  Sensation is grossly intact to pin prick throughout.  Reflexes are  diminished and symmetric.  Toes are downgoing.  Gait and stance were not  tested.   DIAGNOSTIC STUDIES:  MRI of the brain shows a large right hemispheric  subdural hematoma with significant right-to-left shift.   ADMISSION LABORATORIES:  Unremarkable CBC and chemistries, but prothrombin  time is 16.3 with an INR of 1.3.  PTT is 29.   IMPRESSION:  Right hemispheric subdural hematoma following a fall five days  ago in a patient on Coumadin.  The hematoma was associated with significant  left hemiparesis which has been present for the past three days in which he  presented to the emergency room two days ago.   PLAN:  The patient will be admitted for further care.  His anticoagulation  will be reversed with vitamin K and fresh frozen plasma.  Patient will be  taken to surgery for craniotomy for evacuation of the hematoma.  I had an  opportunity to show the patient's MRI scan to the daughter.  I discussed the  nature of his condition and recommendation for surgery with the patient and  his daughter.  We discussed the nature of the surgical procedure, typical  length of surgery, hospital stay for recuperation, limitations  postoperatively, and risks of surgery including risks of infection,  bleeding, possible need for transfusion, the risk of brain dysfunction with  paralysis, coma, and  death, the risks requiring further surgery, and anesthetic risks of  myocardial infarction, stroke, pneumonia, and death.  Understanding all of this and understanding the alternatives to surgery and  associated risks,  they wish for [place] to proceed with surgery and patient will be taken for such.      Hewitt Shorts, M.D.  Electronically Signed     RWN/MEDQ  D:  09/03/2005  T:  09/04/2005  Job:  [id number]   cc:   Osvaldo Shipper. Spruill, M.D.  [address] [address]  [place]  [place] [address]  Fax: [phone]

## 2011-05-11 NOTE — Discharge Summary (Signed)
[name] [name], BANET NO.:  [id number]   MEDICAL RECORD NO.:  [id number]          PATIENT TYPE:  IPS   LOCATION:  4034                         FACILITY:  [facility]   PHYSICIAN:  Hewitt Shorts, M.D.DATE OF BIRTH:  03-30-1923   DATE OF ADMISSION:  09/17/2005  DATE OF DISCHARGE:  09/17/2005                                 DISCHARGE SUMMARY   HISTORY OF PRESENT ILLNESS:  The patient is an 77 year old man who had  fallen five days prior to admission.  He had had a number of emergency room  visits subsequently each time being sent out of the emergency room by the  emergency room physician.  Eventually, an MRI was obtained on the day of  admission which revealed a large right hemispheric right subdural hematoma  with significant right-to-left shift and the patient was admitted for  treatment.  He denied any headaches, diplopia, blurred vision, nausea or  vomiting or seizures.  He had weakness on the left side and possibly some  impaired vision on the left side.  General examination was unremarkable.  Neurologic examination showed he was awake, alert and oriented.  He had  significant left hemiparesis.   HOSPITAL COURSE:  The admission laboratories were notable for a mildly  prolonged prothrombin time although his INR was normal.  The patient was  given fresh frozen plasma and subsequently taken to surgery for a right  frontotemporal parietal craniectomy and evacuation of subdural hematoma.  The hematoma was found to be a chronic hematoma.  The patient was seen in  medical consultation by his primary care physician Dr. Donia [name] who  has assisted with his medication management.  At the time of surgery [name] drains were placed in the subdural space.  There continued to be  moderate drainage from them.  He remained awake and alert, oriented, left  hemiparetic.  Physical therapy and occupational therapy were consulted.  Eventually the frontal drain came out  and subsequently the parietal drain  was removed.  Followup CT scan showed some postoperative acute subdural  hematoma but overall the mass effect was more markedly reduced and the shift  was markedly reduced and the patient remained neurologically stable and in  fact improving with decreasing hemiparesis.  The patient has been able to  continue to make good neurologic progress and the physical therapy and  occupational therapy consultants recommended inpatient rehabilitation and  Dr. Lamar Benes from the rehabilitation service was consulted and saw the  patient and recommended inpatient rehabilitation.  The patient is being  transferred today to the rehabilitation service.  The patient's wound has  healed well.  The staples were removed. There remained some sutures in the  parietal drain site.  The patient may have had a seizure during the  postoperative period and was therefore started on Dilantin, he is currently  taking 200 mg q.12h.   The patient will need to followup once he has been discharged from the  rehabilitation center not only with me but also with his primary physician  Dr. Shana [name] for ongoing medical management.  Followup CT  scan will be  obtained to monitor the subdural hematoma.   DISCHARGE DIAGNOSES:  1.  Chronic subdural hematoma.  2.  Left hemiparesis.      Hewitt Shorts, M.D.  Electronically Signed     RWN/MEDQ  D:  09/17/2005  T:  09/18/2005  Job:  [id number]   cc:   Osvaldo Shipper. Spruill, M.D.  Fax: [phone]   [facility]

## 2011-05-11 NOTE — Discharge Summary (Signed)
NAME:  [name] [name], [name] [name] NO.:  [id number]   MEDICAL RECORD NO.:  [id number]                   PATIENT TYPE:  INP   LOCATION:  4710                                 FACILITY:  [facility]   PHYSICIAN:  Osvaldo Shipper. Spruill, M.D.             DATE OF BIRTH:  [birth year]   DATE OF ADMISSION:  09/17/2003  DATE OF DISCHARGE:  09/22/2003                                 DISCHARGE SUMMARY   DISCHARGE DIAGNOSES:  1. Subendocardial infarction.  2. Hypertension.  3. Cardiomyopathy.  4. Atrial fibrillation.  5. Coronary atherosclerosis of native coronary vessels.  6. Cardiac dysrhythmia.   HISTORY OF PRESENT ILLNESS:  This is a 77 year old male who was seen  initially in the emergency department with complaint of burning in the  chest.  The patient gave a three to four day history of this burning.  It  was primarily substernal in nature.  It did seem to get worse when he was  eating certain foods or when laying in a flat position.  The patient was  noted on the cardiac monitor to be in atrial fibrillation which is not new  for him.  There was noted some ST wave changes, but these were unchanged  from January 2004.  The patient was given antacids in the emergency  department.  These did not change his burning sensation significantly, and  it was the opinion that the patient could possibly be experiencing acute  coronary syndrome.  He was also noted to have an elevated CK of 262, a MB of  7.7, with an index elevated at 2.9.  The patient subsequently admitted for  evaluation of this burning-type sensation.   HOSPITAL COURSE:  The patient was admitted to the medical services, placed  on telemetry, was seen by pharmacy for heparin protocol.  The enzymes  continued to be elevated, and the patient was diagnosed with a small non-Q-  wave MI.  On September 20, 2003, the patient was transferred to telemetry  from the acute care unit area.  He was placed on Coumadin.  His  medications  were adjusted and he was treated successfully for this, and plans were made  on September 22, 2003, to follow the patient as an outpatient.  He is to  have weekly INR's to evaluate his Coumadin.  He is to notify the physician  immediately if any changes, problems, or concerns.   DISCHARGE MEDICATIONS:  1. Nitroglycerin patch 0.4, apply to the chest daily.  2. Lasix 20 mg by mouth each morning.  3. Coumadin 5 mg daily.  4. Lipitor 10 mg daily.  5. Diovan 80 mg by mouth daily.  6. Protonix 40 mg daily.  7. Potassium 20 mEq one daily.   The patient is to have weekly blood draws for INR, and is to be seen in the  office in one week, sooner if any changes, problems, or concerns.  Ivery Quale, P.A.                       Osvaldo Shipper. Spruill, M.D.    HB/MEDQ  D:  11/04/2003  T:  11/05/2003  Job:  [id number]

## 2011-05-11 NOTE — H&P (Signed)
[name] [name], [name] [name] NO.:  [id number]   MEDICAL RECORD NO.:  [id number]          PATIENT TYPE:  IPS   LOCATION:  4034                         FACILITY:  [facility]   PHYSICIAN:  Erick Colace, M.D.DATE OF BIRTH:  [birth year]   DATE OF ADMISSION:  09/17/2005  DATE OF DISCHARGE:                                HISTORY & PHYSICAL   An 77 year old male fell five days prior to admission, presented on  September 03, 2005, with left sided weakness.  MRI with large right  hemispheric STH with right to left shift, underwent right frontotemporal  parietal craniotomy with evacuation of STH, on September 03, 2005, by Dr.  Newell Coral.  The patient on chronic Coumadin prior to admission for Afib, held  secondary to subdural.  Placed on Dilantin for seizure prophylaxis.  Followup cranial CT, September 10, 2005, showed no hydrocephalus.  Shoulder pain following the fall.  X-ray negative but noted to have  supraspinatus tenderness disease.   REVIEW OF SYSTEMS:  Negative for chest pain, negative for nausea or  vomiting.  Positive for reflux, positive for joint swelling as well as back  pain which the patient states is chronic.   PAST HISTORY:  1.  Afib.  2.  Hypertension.  3.  CHF.  4.  DJD.  5.  GERD.  6.  Hyperlipidemia.   FAMILY HISTORY:  Noncontributory.   SOCIAL HISTORY:  He lives alone.  Daughter in the area works.  No other  local family.  He has an 60 year old sister from [place] to provide  supervision x2 weeks after discharge.  One level home, two steps to enter.   FUNCTIONAL HISTORY:  Independent and driving.   CURRENT FUNCTIONAL STATUS:  Min assist transfers __________  min assist gait  200 feet rolling-walker.   HOME MEDICATIONS:  Vicodin, Lasix, Zelnorm, Coumadin, Protonix, Isosorbide  dinitrate, BiDil, tramadol, Diovan, KCl, Lipitor.   CURRENT MEDICATIONS:  Remains on Vicodin, Lasix, off Coumadin secondary to  above, remains on Zelnorm, Protonix.   Zocor has been substituted for  Lipitor.  Avapro has been substituted for Diovan and the patient has been  started on Coreg in addition to above as well as Dilantin.   PHYSICAL EXAMINATION:  VITAL SIGNS:  Blood pressure 123/86, pulse 78,  respirations 18, temp 98.  GENERAL:  An elderly male who has a right craniotomy scar which is nontender  to palpation, no drainage or erythema.  HEENT:  His eyes are anicteric, not injected.  ENT normal.  NECK:  Supple without adenopathy.  LUNGS:  Respiratory effort is good.  Lungs are clear.  HEART:  Irregularly irregular with bradycardia.  ABDOMEN:  Positive bowel sounds.  Soft, nontender.  EXTREMITIES:  No clubbing, cyanosis, or edema.  His strength is graded as 4-  in the bilateral deltoid, 4 bilateral biceps triceps, grip, hip flexor,  quad, PA, and gastroc.  NEUROLOGIC:  Cranial nerves II-XII are normal.  His mood and affect a bit  delayed in terms of his responses.  Orientation is x3 but he uses a  calendar.   IMPRESSION:  1.  Right  subdural hematoma with craniotomy evacuation subdural, September 03, 2005, with decreased self care and mobility skills, decreased      balance.  2.  Seizure prophylaxis - Dilantin 200 every 12 hours.  We will check a      level.  3.  Hypertension.  Continue current medications.  4.  Atrial fibrillation.  Hold Coumadin likely for about six weeks until      cleared by neurosurgery.  5.  Hyperlipidemia.  Continue Zocor.  6.  Congestive heart failure.  Strict input and output.  7.  Constipation.  Colace 100 twice a day.  We will give him a Dulcolax      suppository today, given that he has not had a bowel movement for      several days.      Erick Colace, M.D.  Electronically Signed     AEK/MEDQ  D:  09/17/2005  T:  09/18/2005  Job:  [id number]   cc:   Hewitt Shorts, M.D.  Fax: [phone]   Osvaldo Shipper. Spruill, M.D.  Fax: [phone]

## 2011-05-11 NOTE — Op Note (Signed)
NAME:  [name] [name], [name] [name] NO.:  [id number]   MEDICAL RECORD NO.:  [id number]          PATIENT TYPE:  INP   LOCATION:                               FACILITY:  [facility]   PHYSICIAN:  Hewitt Shorts, M.D.DATE OF BIRTH:  01-Mar-1923   DATE OF PROCEDURE:  09/03/2005  DATE OF DISCHARGE:                                 OPERATIVE REPORT   PREOP DIAGNOSIS:  Right hemispheric subdural hematoma.   POSTOP DIAGNOSIS:  Chronic right hemispheric subdural hematoma.   PROCEDURE:  Right frontal temporal parietal craniectomy and evacuation of  subdural hematoma.   SURGEON:  Hewitt Shorts, M.D.   ASSISTANT:  Stefani Dama, M.D.   ANESTHESIA:  General endotracheal.   INDICATIONS:  The patient is an 77 year old man to presented with left-sided  weakness who was found on MRI scan to have a large right hemispheric  subdural hematoma.  Decision made for him to receive craniotomy and  evacuation of hematoma.   PROCEDURE:  The patient brought to the operating room and placed under  general endotracheal anesthesia.  The patient is placed in a 3-point  Mayfield head holder; and the patient was turned to a left-side-down lateral  decubitus position. The scalp was shaved and prepped with DuraPrep and  draped in sterile fashion. A horizontal incision made over the frontal  parietal boss.  The line of incision was infiltrated with local anesthetic.  Weighted clips were applied to the scalp edges to maintain hemostasis. The  temporalis fascia and muscle were incised and elevated from the skull and  self-retaining retractors were placed.  Four bur holes were made with the X-  Max drill.   The dura was dissected from the overlying skull and then using the  craniotome attachment the bone flap was turned and elevated.  The dura had a  somewhat darkened color.  It was then opened and a thick parietal membrane  was encountered.  The parietal membrane was then incised and tacked up to  the  edges of the dural opening with hemoclips.  The subdural hematoma was a  dark brownish color, consistent with a chronic subdural hematoma.  No acute  component was found. We thoroughly irrigated the subdural space until the  irrigant ran clear; and then the visceral membrane was elevated off of the  brain surface by making a small nick in the visceral membrane and then  placing a micro irrigator and gently elevating the visceral membrane. This  was then removed. Specimens of the parietal and visceral membrane were sent  to pathology for permanent examination.  The brain pulsated well, but did  not fill the hematoma cavity.  Again the intracranial cavity was irrigated  until clear with saline solution. The dura was tacked up around the margins  of the craniectomy with 4-0 Nurolon sutures; and then we placed two, flat,  Jackson-Pratt drains; one placed anteriorly, and the other placed  posteriorly. Both were brought out through their separate stab incisions.  Then the dura was reapproximated to the edge of the dural opening with  interrupted 4-0 Nurolon sutures.  Strips of Surgicel were placed along the  edges of the craniectomy.  The subdural space was filled with saline; and  then the temporalis fascia muscles were approximated with interrupted undyed  2-0 Vicryl sutures. The galea was then closed with interrupted inverted 2-0  Vicryl sutures; and the skin closed surgical staples. The drains were  secured in place with 3-0 nylon sutures.   The wound was dressed with Adaptic and sterile gauze and wrapped with a  Kerlix and a Kling.  The procedure was tolerated well.  The estimated blood  loss was 50 cc. Sponge and instrument counts were correct. Following surgery  the patient was brought back to supine position, removed from the 3-point  Mayfield head holder to be reversed of anesthetic and extubated; and  transferred to the recovery room for further care.      Hewitt Shorts,  M.D.  Electronically Signed     RWN/MEDQ  D:  09/03/2005  T:  09/04/2005  Job:  [id number]

## 2011-05-11 NOTE — Op Note (Signed)
[name] [name], PABST NO.:  [id number]   MEDICAL RECORD NO.:  [id number]          PATIENT TYPE:  INP   LOCATION:  3037                         FACILITY:  [facility]   PHYSICIAN:  Hewitt Shorts, M.D.DATE OF BIRTH:  [birth year]   DATE OF PROCEDURE:  03/06/2006  DATE OF DISCHARGE:                                 OPERATIVE REPORT   PREOPERATIVE DIAGNOSIS:  Right frontoparietal cranial defect.   POSTOPERATIVE DIAGNOSIS:  Right frontoparietal cranial defect.   PROCEDURE:  Right frontoparietal cranioplasty with titanium mesh.   SURGEON:  Hewitt Shorts, M.D.   ASSISTANT:  Payton Doughty, M.D., and Nell Range, A.N.P.S.   ANESTHESIA:  General endotracheal.   INDICATION FOR PROCEDURE:  The patient is an 77 year old man who presented  with a chronic subdural hematoma six months or so ago.  He underwent a  craniectomy and evacuation of the hematoma and returns now for cranioplasty.   PROCEDURE:  The patient was brought to the operating room and placed under  general endotracheal anesthesia.  The patient had a roll placed beneath the  right shoulder and a horseshoe head rest was placed to support the head.  The scalp was shaved and then prepped with Betadine soap and solution and  draped in a sterile fashion.  The previous horizontal right frontoparietal  incision was reopened.  Dissection was carried down to the skull margin and  then the scalp was carefully dissected from the underlying dura and the  margins of the craniectomy were carefully dissected using loupe  magnification.  Self-retaining retractors were placed and we clearly defined  the margins of the cranial defect.  We then used a bigger template to  approximate the cranial defect to plan a titanium mesh plate.  We used a  Lorenz controllable mesh and it was cut to size and then secured to the  margins of the cranial defect with 1.5 x 4 mm self-drilling screws around  the margins.  The dura was then tacked  up to the mesh with about 10 4-0  Nurolon sutures.  We then placed a medium-sized subgaleal Hemovac drain that  was brought out through a separate stab incision and secured to the scalp  with a 3-0 nylon suture and then the scalp flap was closed with interrupted,  inverted 2-0 undyed Vicryl sutures closing the galea and surgical clips  closing the skin edges.  The wound was dressed with Adaptic and sterile  gauze.  The head was wrapped in two Kerlix and two Kling.  The patient  tolerated the procedure well.  The estimated blood loss was 100 mL.  Sponge  and needle count were correct.  Following surgery the patient was to be  reversed from the anesthetic, extubated and transferred to the for further  care.      Hewitt Shorts, M.D.  Electronically Signed    RWN/MEDQ  D:  03/06/2006  T:  03/07/2006  Job:  [id number]

## 2011-05-11 NOTE — Discharge Summary (Signed)
NAMEISIAHA, GREENUP NO.:  [id number]   MEDICAL RECORD NO.:  [id number]          PATIENT TYPE:  INP   LOCATION:  3736                         FACILITY:  [facility]   PHYSICIAN:  Osvaldo Shipper. Spruill, M.D.DATE OF BIRTH:  Jan 30, 1923   DATE OF ADMISSION:  07/13/2005  DATE OF DISCHARGE:  07/17/2005                                 DISCHARGE SUMMARY   DISCHARGE DIAGNOSES:  1.  Coronary atherosclerosis of native coronary vessels.  2.  Intermediate coronary syndrome.  3.  Primary cardiomyopathy.  4.  Atrial fibrillation.  5.  Congestive heart failure.  6.  Hypertension.  7.  Hyperlipidemia.   Mr. Kollman is an 77 year old patient who presented initially to the emergency  department of the [facility] with complaint of chest pain.  He  stated that this pain presented as a burning sensation on the evening of  admission.  He described it as being off and on.  There was no nausea,  vomiting or diaphoresis associated with this particular problem.   The patient was evaluated in the emergency department.  He was given baby  aspirin and started on IV nitroglycerin with some relief of this burning  sensation.  He was subsequently admitted to rule out myocardial infarction,  to evaluate his coronary artery disease and dilated cardiomyopathy.  The  patient has a history of atrial fibrillation; however, on July 22 he  converted to sinus bradycardia of 40 __________ to 50 beats per minute heart  rate.   On July 22 the patient had a Cardiolite stress study.   The Cardiolite study showed an ejection fraction of 38%, no stress-induced  ischemia.  The patient complained of some problems with constipation.  This  was addressed.  The chest pain had improved on July 23 and the IV  nitroglycerin was discontinued.  The serial electrocardiograms revealed  nonspecific T-wave abnormality and PACs.   The patient was seen by pharmacy for warfarin protocol for the atrial  fibrillation.   This was managed.   On July 24, the patient was improved.  He was ambulatory in the hall and on  July 25, it was the opinion that he had received maximum benefit from this  hospitalization and could be discharged home.   Medications at discharge include:  1.  Diovan 80 mg daily.  2.  Lipitor 10 mg daily.  3.  Protonix 40 mg daily.  4.  Coumadin 5 mg daily.  5.  Potassium 20 mEq daily.  6.  Lasix 20 mg daily.  7.  Bidil one three times a day.   The patient is to have home health evaluation of his congestive heart  failure.  He is also set up for evaluation of his PT and INR.      Ivery Quale, P.A.      Osvaldo Shipper. Spruill, M.D.  Electronically Signed    HB/MEDQ  D:  09/11/2005  T:  09/11/2005  Job:  [id number]

## 2011-05-11 NOTE — Discharge Summary (Signed)
NAME:  [name] [name], [name] [name] NO.:  [id number]   MEDICAL RECORD NO.:  [id number]                   PATIENT TYPE:  INP   LOCATION:  3707                                 FACILITY:  [facility]   PHYSICIAN:  Osvaldo Shipper. Spruill, M.D.             DATE OF BIRTH:  [birth year]   DATE OF ADMISSION:  01/20/2003  DATE OF DISCHARGE:  01/26/2003                                 DISCHARGE SUMMARY   DISCHARGE DIAGNOSES:  1. Subendocardial infarction.  2. Atrial flutter.  3. Cardiomyopathy.  4. Hypertension.  5. Hyperpotassemia.  6. Sinoatrial node dysfunction.  7. Atherosclerosis of coronary arteries.   CONSULTANT:  Mohan N. Sharyn Lull, M.D.   PROCEDURE:  Cardiac catheterization on 01/25/2003.   HISTORY OF PRESENT ILLNESS:  This patient is a 77 year old patient who  presented initially to the emergency department with complaint of abdominal  pain.  He reported that the pain had started on the evening prior to his  admission. He felt that it was related to something that he had eaten.  Upon  evaluation of his electrocardiogram during the workup there were  abnormalities noted.  There was noted left anterior fascicular block and  premature atrial complexes appreciated.  The patient was further evaluated  and the total CK was elevated at 493 with an MB of 7.5 and the troponin at  0.05.  The patient was subsequently admitted for evaluation of the same.   HOSPITAL COURSE:  The patient was admitted to the medical service.  He was  placed on telemetry.  IV fluids were started.  The patient was seen on  pharmacy protocol for Lovenox.  The patient had serial EKGs and eventually  had a Cardiolite study which revealed decreased myocardial activity in the  distal anterior and apical walls.  There was noted some hypokinesis  involving the anterior apical and lateral walls.  The ejection fraction was  noted to be 34%.  The patient was noted on 01/22/03 to have a 2.75 second  pause.  The  patient was asymptomatic and asleep during this time.   The patient underwent an esophagogram which showed no obstruction and no  hiatal hernia,  The patient was noted on serial EKG to have atrial flutter  and patient was treated appropriately for this and plans were made for  catheterization with possible PTCA and stenting, at this point.   The cardiac catheterization revealed a moderately left ventricle with global  hypokinesis of the atrial and inferior wall.  The left main was patent.  The  LAD was patent.  The circumflex showed a 20-30% stenosis.  The OM2 was very  very small and showed diffuse disease.  The OM3 was patent.  The right  coronary artery was patent.  The PDA had an 80% focal distal stenosis  supplying only a small area of myocardium.   The patient continued to show a steady uphill course and  on 01/27/2003 it  was the opinion of the patient that he had received maximum benefit from  this hospitalization and he could be discharged home.   MEDICATIONS AT DISCHARGE:  1. K-Dur 20 mg 1 p.o. daily.  2. Cordarone 200 mg every 12 hours.  3. Diovan 160 mg b.i.d.  4. Coumadin 5 mg daily.  5. Aspirin 81 mg daily.  6. Lasix 20 mg daily.   DISCHARGE INSTRUCTIONS:  1. The patient is set up for outpatient follow up of his Coumadin levels.  2. He is to be seen in the office in 1 week for follow up.  3. He is to notify the physician immediately for any changes, problems or     complications.     Ivery Quale, P.A.                       Osvaldo Shipper. Spruill, M.D.    Suella Grove  D:  02/16/2003  T:  02/17/2003  Job:  [id number]

## 2011-07-30 ENCOUNTER — Other Ambulatory Visit: Payer: Self-pay | Admitting: Cardiology

## 2011-07-30 DIAGNOSIS — M549 Dorsalgia, unspecified: Secondary | ICD-10-CM

## 2011-08-08 ENCOUNTER — Ambulatory Visit
Admission: RE | Admit: 2011-08-08 | Discharge: 2011-08-08 | Disposition: A | Discharge status: Home / Self Care | Payer: Medicare Other | Source: Ambulatory Visit | Attending: Cardiology | Admitting: Cardiology

## 2011-08-08 DIAGNOSIS — M549 Dorsalgia, unspecified: Secondary | ICD-10-CM

## 2011-08-08 MED ORDER — METHYLPREDNISOLONE ACETATE 40 MG/ML INJ SUSP (RADIOLOG
120.0000 mg | Freq: Once | INTRAMUSCULAR | Status: AC
  Administered 2011-08-08: 120 mg via EPIDURAL

## 2011-08-08 MED ORDER — IOHEXOL 180 MG/ML  SOLN
1.0000 mL | Freq: Once | INTRAMUSCULAR | Status: AC | PRN
  Administered 2011-08-08: 1 mL via EPIDURAL

## 2011-09-21 LAB — DIFFERENTIAL
Basophils Absolute: 0.1
Basophils Relative: 2 — ABNORMAL HIGH
Eosinophils Absolute: 0.2
Eosinophils Relative: 6 — ABNORMAL HIGH
Lymphocytes Relative: 50 — ABNORMAL HIGH
Lymphs Abs: 1.3
Monocytes Absolute: 0.3
Monocytes Relative: 11
Neutro Abs: 0.9 — ABNORMAL LOW
Neutrophils Relative %: 31 — ABNORMAL LOW

## 2011-09-21 LAB — BASIC METABOLIC PANEL WITH GFR
Calcium: 8.9
GFR calc Af Amer: >60
GFR calc non Af Amer: >60
Potassium: 3.4 — ABNORMAL LOW
Sodium: 142

## 2011-09-21 LAB — CBC
HCT: 40.3
Hemoglobin: 12.9 — ABNORMAL LOW
MCHC: 32
MCV: 81.5
Platelets: 151
RBC: 4.94
RDW: 17.7 — ABNORMAL HIGH
WBC: 2.8 — ABNORMAL LOW

## 2011-09-21 LAB — BASIC METABOLIC PANEL
BUN: 10
CO2: 24
Chloride: 110
Creatinine, Ser: 0.9
Glucose, Bld: 82

## 2011-09-21 LAB — TROPONIN I: Troponin I: 0.04

## 2011-09-21 LAB — B-NATRIURETIC PEPTIDE (CONVERTED LAB): Pro B Natriuretic peptide (BNP): 179 — ABNORMAL HIGH

## 2011-10-05 ENCOUNTER — Emergency Department (HOSPITAL_COMMUNITY): Payer: Medicare Other

## 2011-10-05 ENCOUNTER — Emergency Department (HOSPITAL_COMMUNITY)
Admission: EM | Admit: 2011-10-05 | Discharge: 2011-10-05 | Disposition: A | Discharge status: Home / Self Care | Payer: Medicare Other | Attending: Emergency Medicine | Admitting: Emergency Medicine

## 2011-10-05 DIAGNOSIS — I2510 Atherosclerotic heart disease of native coronary artery without angina pectoris: Secondary | ICD-10-CM | POA: Insufficient documentation

## 2011-10-05 DIAGNOSIS — E7800 Pure hypercholesterolemia, unspecified: Secondary | ICD-10-CM | POA: Insufficient documentation

## 2011-10-05 DIAGNOSIS — I4891 Unspecified atrial fibrillation: Secondary | ICD-10-CM | POA: Insufficient documentation

## 2011-10-05 DIAGNOSIS — K219 Gastro-esophageal reflux disease without esophagitis: Secondary | ICD-10-CM | POA: Insufficient documentation

## 2011-10-05 DIAGNOSIS — I10 Essential (primary) hypertension: Secondary | ICD-10-CM | POA: Insufficient documentation

## 2011-10-05 DIAGNOSIS — I509 Heart failure, unspecified: Secondary | ICD-10-CM | POA: Insufficient documentation

## 2011-10-05 DIAGNOSIS — R1013 Epigastric pain: Secondary | ICD-10-CM | POA: Insufficient documentation

## 2011-10-05 DIAGNOSIS — R072 Precordial pain: Secondary | ICD-10-CM | POA: Insufficient documentation

## 2011-10-05 DIAGNOSIS — E785 Hyperlipidemia, unspecified: Secondary | ICD-10-CM | POA: Insufficient documentation

## 2011-10-05 DIAGNOSIS — R509 Fever, unspecified: Secondary | ICD-10-CM | POA: Insufficient documentation

## 2011-10-05 LAB — CBC
MCH: 25.6 pg — ABNORMAL LOW (ref 26.0–34.0)
MCV: 73.4 fL — ABNORMAL LOW (ref 78.0–100.0)
Platelets: 244 10*3/uL (ref 150–400)
RDW: 17.1 % — ABNORMAL HIGH (ref 11.5–15.5)
WBC: 5 10*3/uL (ref 4.0–10.5)

## 2011-10-05 LAB — BASIC METABOLIC PANEL
BUN: 6 mg/dL (ref 6–23)
Calcium: 9.6 mg/dL (ref 8.4–10.5)
Creatinine, Ser: 0.89 mg/dL (ref 0.50–1.35)
GFR calc non Af Amer: 75 mL/min — ABNORMAL LOW (ref >90)
Glucose, Bld: 101 mg/dL — ABNORMAL HIGH (ref 70–99)

## 2011-10-05 LAB — DIFFERENTIAL
Basophils Relative: 1 % (ref 0–1)
Eosinophils Absolute: 0.2 10*3/uL (ref 0.0–0.7)
Monocytes Absolute: 0.6 10*3/uL (ref 0.1–1.0)
Neutrophils Relative %: 28 % — ABNORMAL LOW (ref 43–77)

## 2011-10-08 ENCOUNTER — Other Ambulatory Visit: Payer: Self-pay | Admitting: Cardiology

## 2011-10-08 DIAGNOSIS — M545 Low back pain, unspecified: Secondary | ICD-10-CM

## 2011-10-09 ENCOUNTER — Ambulatory Visit
Admission: RE | Admit: 2011-10-09 | Discharge: 2011-10-09 | Disposition: A | Discharge status: Home / Self Care | Payer: Medicare Other | Source: Ambulatory Visit | Attending: Cardiology | Admitting: Cardiology

## 2011-10-09 DIAGNOSIS — M545 Low back pain: Secondary | ICD-10-CM

## 2011-10-09 MED ORDER — IOHEXOL 180 MG/ML  SOLN
1.0000 mL | Freq: Once | INTRAMUSCULAR | Status: AC | PRN
  Administered 2011-10-09: 1 mL via EPIDURAL

## 2011-10-09 MED ORDER — METHYLPREDNISOLONE ACETATE 40 MG/ML INJ SUSP (RADIOLOG
120.0000 mg | Freq: Once | INTRAMUSCULAR | Status: AC
  Administered 2011-10-09: 120 mg via EPIDURAL

## 2011-11-19 ENCOUNTER — Emergency Department (HOSPITAL_COMMUNITY)
Admission: EM | Admit: 2011-11-19 | Discharge: 2011-11-19 | Disposition: A | Discharge status: Home / Self Care | Payer: Medicare Other | Attending: Emergency Medicine | Admitting: Emergency Medicine

## 2011-11-19 ENCOUNTER — Encounter: Payer: Self-pay | Admitting: Emergency Medicine

## 2011-11-19 ENCOUNTER — Emergency Department (HOSPITAL_COMMUNITY): Payer: Medicare Other

## 2011-11-19 ENCOUNTER — Other Ambulatory Visit: Payer: Self-pay

## 2011-11-19 DIAGNOSIS — R079 Chest pain, unspecified: Secondary | ICD-10-CM | POA: Insufficient documentation

## 2011-11-19 DIAGNOSIS — Z79899 Other long term (current) drug therapy: Secondary | ICD-10-CM | POA: Insufficient documentation

## 2011-11-19 DIAGNOSIS — I4891 Unspecified atrial fibrillation: Secondary | ICD-10-CM | POA: Insufficient documentation

## 2011-11-19 DIAGNOSIS — K219 Gastro-esophageal reflux disease without esophagitis: Secondary | ICD-10-CM | POA: Insufficient documentation

## 2011-11-19 HISTORY — DX: Heart failure, unspecified: I50.9

## 2011-11-19 HISTORY — DX: Unspecified osteoarthritis, unspecified site: M19.90

## 2011-11-19 HISTORY — DX: Essential (primary) hypertension: I10

## 2011-11-19 LAB — POCT I-STAT TROPONIN I: Troponin i, poc: 0.01 ng/mL (ref 0.00–0.08)

## 2011-11-19 LAB — CBC
MCH: 25.7 pg — ABNORMAL LOW (ref 26.0–34.0)
MCHC: 34.2 g/dL (ref 30.0–36.0)
Platelets: 212 10*3/uL (ref 150–400)

## 2011-11-19 LAB — COMPREHENSIVE METABOLIC PANEL
ALT: 9 U/L (ref 0–53)
AST: 21 U/L (ref 0–37)
Calcium: 8.7 mg/dL (ref 8.4–10.5)
GFR calc Af Amer: 88 mL/min — ABNORMAL LOW (ref >90)
Sodium: 143 mEq/L (ref 135–145)
Total Protein: 6.8 g/dL (ref 6.0–8.3)

## 2011-11-19 LAB — PROTIME-INR: INR: 1.18 (ref 0.00–1.49)

## 2011-11-19 MED ORDER — GI COCKTAIL ~~LOC~~
30.0000 mL | Freq: Once | ORAL | Status: AC
  Administered 2011-11-19: 30 mL via ORAL
  Filled 2011-11-19: qty 30

## 2011-11-19 MED ORDER — OMEPRAZOLE 20 MG PO CPDR: 20.0000 mg | DELAYED_RELEASE_CAPSULE | Freq: Every day | ORAL | Status: DC

## 2011-11-19 MED ORDER — NITROGLYCERIN 2 % TD OINT
1.0000 [in_us] | TOPICAL_OINTMENT | Freq: Four times a day (QID) | TRANSDERMAL | Status: DC
  Administered 2011-11-19: 1 [in_us] via TOPICAL
  Filled 2011-11-19: qty 1

## 2011-11-19 MED ORDER — ASPIRIN 81 MG PO CHEW
324.0000 mg | CHEWABLE_TABLET | Freq: Once | ORAL | Status: AC
  Administered 2011-11-19: 324 mg via ORAL
  Filled 2011-11-19: qty 4

## 2011-11-19 MED ORDER — SODIUM CHLORIDE 0.9 % IV SOLN
20.0000 mL | INTRAVENOUS | Status: DC
  Administered 2011-11-19: 1000 mL via INTRAVENOUS

## 2011-11-19 MED ORDER — SUCRALFATE 1 G PO TABS: 1.0000 g | ORAL_TABLET | Freq: Four times a day (QID) | ORAL | Status: DC

## 2011-11-19 NOTE — ED Provider Notes (Signed)
History     CSN: [id number] Arrival date & time: 11/19/2011  7:27 AM   First MD Initiated Contact with Patient 11/19/11 [phone]      Chief Complaint  Patient presents with  . Chest Pain    (Consider location/radiation/quality/duration/timing/severity/associated sxs/prior treatment) Patient is a 77 y.o. male presenting with chest pain. The history is provided by the patient.  Chest Pain Chest pain occurs intermittently. The chest pain is resolved. The severity of the pain is moderate. The quality of the pain is described as burning. The pain does not radiate. Chest pain is worsened by eating. Pertinent negatives for primary symptoms include no fever, no shortness of breath, no cough, no wheezing, no nausea and no vomiting.  Pertinent negatives for past medical history include no CAD.   Pt has been having chest discomfort for 2 or 3 days.  Pt states he has history of acid reflux and he has been taking his medications without relief.  It has been getting a little worse.  It has been coming and going at times.  It lasts for a few hours.  When he eats he gets a bad taste in his mouth.       No past medical history on file.  No past surgical history on file.  No family history on file.  History  Substance Use Topics  . Smoking status: Not on file  . Smokeless tobacco: Not on file  . Alcohol Use: Not on file      Review of Systems  Constitutional: Negative for fever.  Respiratory: Negative for cough, shortness of breath and wheezing.   Cardiovascular: Positive for chest pain.  Gastrointestinal: Negative for nausea and vomiting.  All other systems reviewed and are negative.    Allergies  Review of patient's allergies indicates no known allergies.  Home Medications   Current Outpatient Rx  Name Route Sig Dispense Refill  . ATORVASTATIN CALCIUM 10 MG PO TABS Oral Take 10 mg by mouth daily.      Marland Kitchen FAMOTIDINE 20 MG PO TABS Oral Take 20 mg by mouth daily.      . FUROSEMIDE  40 MG PO TABS Oral Take 40 mg by mouth daily.      Marland Kitchen HYDRALAZINE HCL 25 MG PO TABS Oral Take 25 mg by mouth 2 (two) times daily.      Marland Kitchen HYDROCODONE-ACETAMINOPHEN 5-500 MG PO TABS Oral Take 1 tablet by mouth every 4 (four) hours as needed. For back pain     . ISOSORBIDE DINITRATE 20 MG PO TABS Oral Take 20 mg by mouth 2 (two) times daily.      Marland Kitchen POTASSIUM CHLORIDE CRYS CR 20 MEQ PO TBCR Oral Take 20 mEq by mouth daily.      Marland Kitchen RAMIPRIL 2.5 MG PO CAPS Oral Take 2.5 mg by mouth daily.      Marland Kitchen TAMSULOSIN HCL 0.4 MG PO CAPS Oral Take 0.4 mg by mouth daily.        BP 120/58  Pulse 49  Temp(Src) 98.6 F (37 C) (Oral)  Resp 14  Ht 6\' 1"  (1.854 m)  Wt 172 lb (78.019 kg)  BMI 22.69 kg/m2  SpO2 98%  Physical Exam  Nursing note and vitals reviewed. Constitutional: He appears well-developed and well-nourished. No distress.  HENT:  Head: Normocephalic and atraumatic.  Right Ear: External ear normal.  Left Ear: External ear normal.  Eyes: Conjunctivae are normal. Right eye exhibits no discharge. Left eye exhibits no discharge. No scleral icterus.  Neck: Neck supple. No tracheal deviation present.  Cardiovascular: Normal rate and intact distal pulses.        Irregular  rhythm  Pulmonary/Chest: Effort normal and breath sounds normal. No stridor. No respiratory distress. He has no wheezes. He has no rales.  Abdominal: Soft. Bowel sounds are normal. He exhibits no distension. There is no tenderness. There is no rebound and no guarding.  Musculoskeletal: He exhibits no edema and no tenderness.  Neurological: He is alert. He has normal strength. No sensory deficit. Cranial nerve deficit:  no gross defecits noted. He exhibits normal muscle tone. He displays no seizure activity. Coordination normal.  Skin: Skin is warm and dry. No rash noted.  Psychiatric: He has a normal mood and affect.    ED Course  Procedures (including critical care time)  Date: 11/19/2011  Rate: 78  Rhythm: atrial  fibrillation  QRS Axis: left  Intervals: Atrial fibrillation  ST/T Wave abnormalities: normal  Conduction Disutrbances:Atrial fibrillation  Narrative Interpretation: Left axis deviation is new compared to the  last  Old EKG Reviewed: changes noted   Labs Reviewed  CBC - Abnormal; Notable for the following:    WBC 3.2 (*)    Hemoglobin 12.8 (*)    HCT 37.4 (*)    MCV 74.9 (*)    MCH 25.7 (*)    RDW 17.4 (*)    All other components within normal limits  COMPREHENSIVE METABOLIC PANEL - Abnormal; Notable for the following:    Potassium 3.2 (*)    Albumin 3.2 (*)    GFR calc non Af Amer 76 (*)    GFR calc Af Amer 88 (*)    All other components within normal limits  PROTIME-INR  APTT  POCT I-STAT TROPONIN I  I-STAT TROPONIN I   Dg Chest Portable 1 View  11/19/2011  *RADIOLOGY REPORT*  Clinical Data: Chest pain.  PORTABLE CHEST - 1 VIEW  Comparison: 10/05/2011  Findings: Single view of the chest demonstrates mild enlargement of the cardiac silhouette.  There are atherosclerotic calcifications involving the aortic arch.  There is no focal airspace disease or edema.  Heart and mediastinum are likely accentuated by the portable technique.  IMPRESSION: No acute chest findings.  Original Report Authenticated By: Richarda Overlie, M.D.    Diagnosis: GERD   MDM  Patient's symptoms are suggestive of GERD. His cardiac enzymes are normal and his EKG does not appear changed. Patient is on oral medications for his GERD.  I will change him from the H2-blocker 2 Prilosec. I will also add on Carafate and have him followup with his doctor this week.        Celene Kras, MD 11/19/11 [phone]

## 2011-11-19 NOTE — ED Notes (Signed)
Pt states he has had burning in epigastric area X 3 days intermittently, worsening last pm to 2/10, "not too bad, just a little bit." denies pain now. Denies associated n/v/SOB, diaphoresis/radiation. States he has been here for similar pain several; times, treated with PO" cup of medication which looked like ice cream and numbed my throat and it helped a lot."

## 2011-11-20 ENCOUNTER — Other Ambulatory Visit: Payer: Self-pay | Admitting: Internal Medicine

## 2011-11-20 DIAGNOSIS — M545 Low back pain, unspecified: Secondary | ICD-10-CM

## 2011-11-21 ENCOUNTER — Ambulatory Visit
Admission: RE | Admit: 2011-11-21 | Discharge: 2011-11-21 | Disposition: A | Discharge status: Home / Self Care | Payer: Medicare Other | Source: Ambulatory Visit | Attending: Internal Medicine | Admitting: Internal Medicine

## 2011-11-21 DIAGNOSIS — M545 Low back pain: Secondary | ICD-10-CM

## 2011-11-21 MED ORDER — IOHEXOL 180 MG/ML  SOLN
1.0000 mL | Freq: Once | INTRAMUSCULAR | Status: AC | PRN
  Administered 2011-11-21: 1 mL via EPIDURAL

## 2011-11-21 MED ORDER — METHYLPREDNISOLONE ACETATE 40 MG/ML INJ SUSP (RADIOLOG
120.0000 mg | Freq: Once | INTRAMUSCULAR | Status: AC
  Administered 2011-11-21: 120 mg via EPIDURAL

## 2011-11-25 ENCOUNTER — Other Ambulatory Visit: Payer: Self-pay | Admitting: Cardiology

## 2011-11-25 ENCOUNTER — Encounter (HOSPITAL_COMMUNITY): Payer: Self-pay | Admitting: Emergency Medicine

## 2012-01-14 ENCOUNTER — Other Ambulatory Visit: Payer: Self-pay

## 2012-01-14 ENCOUNTER — Other Ambulatory Visit: Payer: Self-pay | Admitting: Cardiology

## 2012-01-14 ENCOUNTER — Encounter (HOSPITAL_COMMUNITY): Payer: Self-pay

## 2012-01-14 ENCOUNTER — Emergency Department (HOSPITAL_COMMUNITY): Payer: Medicare Other

## 2012-01-14 ENCOUNTER — Emergency Department (HOSPITAL_COMMUNITY)
Admission: EM | Admit: 2012-01-14 | Discharge: 2012-01-14 | Disposition: A | Discharge status: Home / Self Care | Payer: Medicare Other | Attending: Emergency Medicine | Admitting: Emergency Medicine

## 2012-01-14 DIAGNOSIS — I509 Heart failure, unspecified: Secondary | ICD-10-CM | POA: Insufficient documentation

## 2012-01-14 DIAGNOSIS — R072 Precordial pain: Secondary | ICD-10-CM | POA: Insufficient documentation

## 2012-01-14 DIAGNOSIS — Z79899 Other long term (current) drug therapy: Secondary | ICD-10-CM | POA: Insufficient documentation

## 2012-01-14 DIAGNOSIS — K219 Gastro-esophageal reflux disease without esophagitis: Secondary | ICD-10-CM | POA: Insufficient documentation

## 2012-01-14 DIAGNOSIS — R1013 Epigastric pain: Secondary | ICD-10-CM | POA: Insufficient documentation

## 2012-01-14 DIAGNOSIS — I10 Essential (primary) hypertension: Secondary | ICD-10-CM | POA: Insufficient documentation

## 2012-01-14 DIAGNOSIS — M129 Arthropathy, unspecified: Secondary | ICD-10-CM | POA: Insufficient documentation

## 2012-01-14 DIAGNOSIS — R12 Heartburn: Secondary | ICD-10-CM | POA: Insufficient documentation

## 2012-01-14 HISTORY — DX: Gastro-esophageal reflux disease without esophagitis: K21.9

## 2012-01-14 LAB — CBC
HCT: 39.5 % (ref 39.0–52.0)
Hemoglobin: 13.6 g/dL (ref 13.0–17.0)
MCHC: 34.4 g/dL (ref 30.0–36.0)
RDW: 16.4 % — ABNORMAL HIGH (ref 11.5–15.5)
WBC: 3.3 10*3/uL — ABNORMAL LOW (ref 4.0–10.5)

## 2012-01-14 LAB — URINALYSIS, ROUTINE W REFLEX MICROSCOPIC
Bilirubin Urine: NEGATIVE
Glucose, UA: NEGATIVE mg/dL
Hgb urine dipstick: NEGATIVE
Ketones, ur: NEGATIVE mg/dL
Protein, ur: NEGATIVE mg/dL

## 2012-01-14 LAB — URINE MICROSCOPIC-ADD ON

## 2012-01-14 LAB — COMPREHENSIVE METABOLIC PANEL
BUN: 8 mg/dL (ref 6–23)
CO2: 25 mEq/L (ref 19–32)
Chloride: 106 mEq/L (ref 96–112)
Creatinine, Ser: 0.77 mg/dL (ref 0.50–1.35)
GFR calc non Af Amer: 80 mL/min — ABNORMAL LOW (ref >90)
Glucose, Bld: 78 mg/dL (ref 70–99)
Total Bilirubin: 0.5 mg/dL (ref 0.3–1.2)

## 2012-01-14 LAB — DIFFERENTIAL
Lymphocytes Relative: 42 % (ref 12–46)
Monocytes Absolute: 0.3 10*3/uL (ref 0.1–1.0)
Neutro Abs: 1.3 10*3/uL — ABNORMAL LOW (ref 1.7–7.7)
Neutrophils Relative %: 40 % — ABNORMAL LOW (ref 43–77)

## 2012-01-14 MED ORDER — ALUM & MAG HYDROXIDE-SIMETH 200-200-20 MG/5ML PO SUSP: ORAL | Status: DC

## 2012-01-14 MED ORDER — GI COCKTAIL ~~LOC~~
30.0000 mL | Freq: Once | ORAL | Status: AC
  Administered 2012-01-14: 30 mL via ORAL
  Filled 2012-01-14: qty 30

## 2012-01-14 NOTE — ED Provider Notes (Signed)
History     CSN: [id number]  Arrival date & time 01/14/12  6578   First MD Initiated Contact with Patient 01/14/12 [phone]      Chief Complaint  Patient presents with  . Abdominal Pain    (Consider location/radiation/quality/duration/timing/severity/associated sxs/prior treatment) HPI Comments: 78 year old man who complains of left upper quadrant and substernal burning pain. He has known gastroesophageal reflux, for which he takes omeprazole. He also has a history of coronary artery disease and congestive heart failure.  Patient is a 78 y.o. male presenting with abdominal pain. The history is provided by the patient and medical records. No language interpreter was used.  Abdominal Pain The primary symptoms of the illness include abdominal pain. The primary symptoms of the illness do not include fever. Episode onset: Onset of this pain several days ago. The onset of the illness was gradual. The problem has not changed since onset. Associated with: He has a  prior history of GERD. The patient has not had a change in bowel habit. Additional symptoms associated with the illness include heartburn. Symptoms associated with the illness do not include chills or constipation. Significant associated medical issues include GERD.    Past Medical History  Diagnosis Date  . Hypertension   . Arthritis   . CHF (congestive heart failure)     History reviewed. No pertinent past surgical history.  History reviewed. No pertinent family history.  History  Substance Use Topics  . Smoking status: Former Smoker -- 0.2 packs/day for 15 years    Quit date: 12/24/1994  . Smokeless tobacco: Not on file  . Alcohol Use: No      Review of Systems  Constitutional: Negative for fever and chills.  HENT: Negative.   Eyes: Negative.   Respiratory: Negative.   Cardiovascular: Negative.   Gastrointestinal: Positive for heartburn and abdominal pain. Negative for constipation.  Genitourinary: Negative.     Musculoskeletal: Negative.   Neurological: Negative.   Psychiatric/Behavioral: Negative.     Allergies  Review of patient's allergies indicates no known allergies.  Home Medications   Current Outpatient Rx  Name Route Sig Dispense Refill  . GAVISCON PO Oral Take 15 mLs by mouth daily.    . ATORVASTATIN CALCIUM 10 MG PO TABS Oral Take 10 mg by mouth daily.      . FUROSEMIDE 40 MG PO TABS Oral Take 40 mg by mouth daily.      Marland Kitchen HYDRALAZINE HCL 25 MG PO TABS Oral Take 25 mg by mouth 2 (two) times daily.     Marland Kitchen HYDROCODONE-ACETAMINOPHEN 5-500 MG PO TABS Oral Take 1 tablet by mouth every 4 (four) hours as needed. For back pain     . IBUPROFEN 200 MG PO TABS Oral Take 200 mg by mouth daily.    . ISOSORBIDE DINITRATE 20 MG PO TABS Oral Take 20 mg by mouth 2 (two) times daily.      Marland Kitchen NAPROXEN SODIUM 220 MG PO TABS Oral Take 220 mg by mouth daily.    Marland Kitchen OMEPRAZOLE 20 MG PO CPDR Oral Take 1 capsule (20 mg total) by mouth daily. 30 capsule 0  . POTASSIUM CHLORIDE CRYS ER 20 MEQ PO TBCR Oral Take 20 mEq by mouth daily.      Marland Kitchen RAMIPRIL 2.5 MG PO CAPS Oral Take 2.5 mg by mouth daily.      Marland Kitchen TAMSULOSIN HCL 0.4 MG PO CAPS Oral Take 0.4 mg by mouth daily.        BP 122/72  Temp(Src)  98 F (36.7 C) (Oral)  Resp 16  SpO2 99%  Physical Exam  Constitutional: He is oriented to person, place, and time. He appears well-developed and well-nourished. No distress.  HENT:  Head: Normocephalic and atraumatic.  Right Ear: External ear normal.  Left Ear: External ear normal.  Mouth/Throat: Oropharynx is clear and moist.  Eyes: Conjunctivae and EOM are normal. Pupils are equal, round, and reactive to light.  Neck: Normal range of motion. Neck supple. No JVD present.  Cardiovascular: Normal rate, regular rhythm and normal heart sounds.   Pulmonary/Chest: Effort normal and breath sounds normal. No respiratory distress.  Abdominal: Soft. Bowel sounds are normal.       He localizes pain to the epigastric and  left upper quadrant region of his abdomen.  Musculoskeletal: Normal range of motion.  Neurological: He is alert and oriented to person, place, and time. No cranial nerve deficit.       No gross sensory or motor deficit   Skin: Skin is warm and dry.  Psychiatric: He has a normal mood and affect. His behavior is normal.    ED Course  Procedures (including critical care time)      7:44 AM  Date: 01/14/2012  Rate: 90  Rhythm: atrial fibrillation  QRS Axis: left  Intervals: normal QRS:  Poor R. wave progression in the precordial leads suggests old anterior myocardial infarction.  ST/T Wave abnormalities: normal  Conduction Disutrbances:nonspecific intraventricular conduction delay  Narrative Interpretation: Abnormal EKG  Old EKG Reviewed: unchanged  8:10 AM Pt seen --> physical exam performed.  Lab workup ordered.  GI cocktail ordered.  11:08 AM Results for orders placed during the hospital encounter of 01/14/12  CBC      Component Value Range   WBC 3.3 (*) 4.0 - 10.5 (K/uL)   RBC 5.26  4.22 - 5.81 (MIL/uL)   Hemoglobin 13.6  13.0 - 17.0 (g/dL)   HCT 14.7  82.9 - 56.2 (%)   MCV 75.1 (*) 78.0 - 100.0 (fL)   MCH 25.9 (*) 26.0 - 34.0 (pg)   MCHC 34.4  30.0 - 36.0 (g/dL)   RDW 13.0 (*) 86.5 - 15.5 (%)   Platelets 267  150 - 400 (K/uL)  DIFFERENTIAL      Component Value Range   Neutrophils Relative 40 (*) 43 - 77 (%)   Neutro Abs 1.3 (*) 1.7 - 7.7 (K/uL)   Lymphocytes Relative 42  12 - 46 (%)   Lymphs Abs 1.4  0.7 - 4.0 (K/uL)   Monocytes Relative 10  3 - 12 (%)   Monocytes Absolute 0.3  0.1 - 1.0 (K/uL)   Eosinophils Relative 7 (*) 0 - 5 (%)   Eosinophils Absolute 0.2  0.0 - 0.7 (K/uL)   Basophils Relative 2 (*) 0 - 1 (%)   Basophils Absolute 0.1  0.0 - 0.1 (K/uL)  URINALYSIS, ROUTINE W REFLEX MICROSCOPIC      Component Value Range   Color, Urine YELLOW  YELLOW    APPearance CLOUDY (*) CLEAR    Specific Gravity, Urine 1.015  1.005 - 1.030    pH 5.0  5.0 - 8.0     Glucose, UA NEGATIVE  NEGATIVE (mg/dL)   Hgb urine dipstick NEGATIVE  NEGATIVE    Bilirubin Urine NEGATIVE  NEGATIVE    Ketones, ur NEGATIVE  NEGATIVE (mg/dL)   Protein, ur NEGATIVE  NEGATIVE (mg/dL)   Urobilinogen, UA 0.2  0.0 - 1.0 (mg/dL)   Nitrite NEGATIVE  NEGATIVE  Leukocytes, UA SMALL (*) NEGATIVE   LIPASE, BLOOD      Component Value Range   Lipase 23  11 - 59 (U/L)  COMPREHENSIVE METABOLIC PANEL      Component Value Range   Sodium 139  135 - 145 (mEq/L)   Potassium 3.2 (*) 3.5 - 5.1 (mEq/L)   Chloride 106  96 - 112 (mEq/L)   CO2 25  19 - 32 (mEq/L)   Glucose, Bld 78  70 - 99 (mg/dL)   BUN 8  6 - 23 (mg/dL)   Creatinine, Ser 0.10  0.50 - 1.35 (mg/dL)   Calcium 9.0  8.4 - 27.2 (mg/dL)   Total Protein 6.9  6.0 - 8.3 (g/dL)   Albumin 3.0 (*) 3.5 - 5.2 (g/dL)   AST 22  0 - 37 (U/L)   ALT 10  0 - 53 (U/L)   Alkaline Phosphatase 63  39 - 117 (U/L)   Total Bilirubin 0.5  0.3 - 1.2 (mg/dL)   GFR calc non Af Amer 80 (*) >90 (mL/min)   GFR calc Af Amer >90  >90 (mL/min)  URINE MICROSCOPIC-ADD ON      Component Value Range   Squamous Epithelial / LPF RARE  RARE    WBC, UA 0-2  <3 (WBC/hpf)   RBC / HPF 0-2  <3 (RBC/hpf)   Bacteria, UA RARE  RARE    Dg Abd Acute W/chest  01/14/2012  *RADIOLOGY REPORT*  Clinical Data: Epigastric and substernal pain.  Left upper abdominal pain.  ACUTE ABDOMEN SERIES (ABDOMEN 2 VIEW & CHEST 1 VIEW)  Comparison: 02/17/2011  Findings: The lungs are clear without focal infiltrate, edema, pneumothorax or pleural effusion. The cardiopericardial silhouette is enlarged. Imaged bony structures of the thorax are intact.  Upright film shows no evidence for intraperitoneal free air. There is no evidence for gaseous bowel dilation to suggest obstruction. Degenerative changes are noted in the lumbar spine and both hips.  IMPRESSION: Stable chest x-ray without acute cardiopulmonary findings.  No evidence for intraperitoneal free air or bowel obstruction.  Original  Report Authenticated By: ERIC A. MANSELL, M.D.    Lab workup is negative.  Symptoms seem like his prior episodes of GERD.  Will have pt have orthostatic vital signs. 1:34 PM Pt not particularly orthostatic.  Will release, to continue to take omeprazole, add antacids pc and hs.  F/U with Dr. Sharyn Lull in the office.   1. GERD (gastroesophageal reflux disease)             Carleene Cooper III, MD 01/14/12 [phone]

## 2012-01-14 NOTE — ED Notes (Signed)
Attempted to give pt medication, but pt did not have armband. Called registration and they stated that they would bring armband for pt.

## 2012-01-14 NOTE — ED Notes (Signed)
Pt states he has been having acid reflux on the left side of his abdomen and he has been feeling weak in legs.

## 2012-01-21 ENCOUNTER — Other Ambulatory Visit: Payer: Self-pay | Admitting: Ophthalmology

## 2012-01-21 NOTE — H&P (Signed)
  Pre-operative History and Physical for Ophthalmic Surgery  [name] [name] 01/21/2012                  Chief Complaint: Decreased Vision  Diagnosis: Cataract  No Known Allergies   Prior to Admission medications   Medication Sig Start Date End Date Taking? Authorizing Provider  alum & mag hydroxide-simeth (MYLANTA) 200-200-20 MG/5ML suspension Give pt two teaspoons of Mylanta after meals and at bedtime. 01/14/12   Alan Davidson III, MD  Alum Hydroxide-Mag Carbonate (GAVISCON PO) Take 15 mLs by mouth daily.    Historical Provider, MD  atorvastatin (LIPITOR) 10 MG tablet Take 10 mg by mouth daily.      Historical Provider, MD  furosemide (LASIX) 40 MG tablet Take 40 mg by mouth daily.      Historical Provider, MD  hydrALAZINE (APRESOLINE) 25 MG tablet Take 25 mg by mouth 2 (two) times daily.     Historical Provider, MD  HYDROcodone-acetaminophen (VICODIN) 5-500 MG per tablet Take 1 tablet by mouth every 4 (four) hours as needed. For back pain     Historical Provider, MD  ibuprofen (ADVIL,MOTRIN) 200 MG tablet Take 200 mg by mouth daily.    Historical Provider, MD  isosorbide dinitrate (ISORDIL) 20 MG tablet Take 20 mg by mouth 2 (two) times daily.      Historical Provider, MD  naproxen sodium (ANAPROX) 220 MG tablet Take 220 mg by mouth daily.    Historical Provider, MD  omeprazole (PRILOSEC) 20 MG capsule Take 1 capsule (20 mg total) by mouth daily. 11/19/11 11/18/12  Jon R Knapp, MD  potassium chloride SA (K-DUR,KLOR-CON) 20 MEQ tablet Take 20 mEq by mouth daily.      Historical Provider, MD  ramipril (ALTACE) 2.5 MG capsule Take 2.5 mg by mouth daily.      Historical Provider, MD  Tamsulosin HCl (FLOMAX) 0.4 MG CAPS Take 0.4 mg by mouth daily.      Historical Provider, MD    Planned Procedure:    Phacoemulsification, Posterior Chamber Intra-ocular Lens OD  There were no vitals filed for this visit.  Pulse: 70 irreg, irreg         Temp: NE       Resp:  18      ROS:  non-contributory   Past Medical History  Diagnosis Date  . Hypertension   . Arthritis   . CHF (congestive heart failure)   . GERD (gastroesophageal reflux disease)     Past Surgical History  Procedure Date  . Evacuation of subdural hematoma      History   Social History  . Marital Status: Widowed    Spouse Name: N/A    Number of Children: N/A  . Years of Education: N/A   Occupational History  . Not on file.   Social History Main Topics  . Smoking status: Former Smoker -- 0.2 packs/day for 15 years    Quit date: 12/24/1994  . Smokeless tobacco: Not on file  . Alcohol Use: No  . Drug Use: No  . Sexually Active:    Other Topics Concern  . Not on file   Social History Narrative  . No narrative on file     The following examination is for anesthesia clearance for minimally invasive Ophthalmic surgery. It is primarily to document heart and lung findings and is not intended to elucidate unknown general medical conditions inclusive of abdominal masses, lung lesions, etc.   General Constitution:  within normal limits     Alertness/Orientation:  Person, time place     yes   HEENT:  Eye Findings: Cataract OD                   right eye  Neck: supple without masses  Chest/Lungs: clear to auscultation  Cardiac: Normal S1 and S2 without Murmur, S3 or S4  Neuro: non-focal   Impression: Visually Significant Cataract OD  Planned Procedure:  Phacoemulsification, Posterior Chamber Intraocular Lens OD     [name] Macmillan, MD        

## 2012-01-23 ENCOUNTER — Encounter (HOSPITAL_COMMUNITY): Payer: Self-pay | Admitting: Pharmacy Technician

## 2012-01-28 ENCOUNTER — Encounter (HOSPITAL_COMMUNITY): Payer: Self-pay

## 2012-01-28 ENCOUNTER — Encounter (HOSPITAL_COMMUNITY)
Admission: RE | Admit: 2012-01-28 | Discharge: 2012-01-28 | Disposition: A | Discharge status: Home / Self Care | Payer: Medicare Other | Source: Ambulatory Visit | Attending: Ophthalmology | Admitting: Ophthalmology

## 2012-01-28 HISTORY — DX: Acute upper respiratory infection, unspecified: J06.9

## 2012-01-28 LAB — CBC
HCT: 40.2 % (ref 39.0–52.0)
Hemoglobin: 13.2 g/dL (ref 13.0–17.0)
RDW: 16.7 % — ABNORMAL HIGH (ref 11.5–15.5)
WBC: 4 10*3/uL (ref 4.0–10.5)

## 2012-01-28 LAB — BASIC METABOLIC PANEL
Chloride: 108 mEq/L (ref 96–112)
Creatinine, Ser: 0.86 mg/dL (ref 0.50–1.35)
GFR calc Af Amer: 88 mL/min — ABNORMAL LOW (ref >90)
GFR calc non Af Amer: 76 mL/min — ABNORMAL LOW (ref >90)
Potassium: 3.3 mEq/L — ABNORMAL LOW (ref 3.5–5.1)

## 2012-01-28 NOTE — Progress Notes (Signed)
Call AGAIN to Dr. Annitta Jersey office, requested last visit note & ekg & stress test.   Faxed request to their ofice as well.

## 2012-01-28 NOTE — Pre-Procedure Instructions (Signed)
20 Ladd Catino  01/28/2012   Your procedure is scheduled on:  01/30/2012  Report to [facility] at 7:50 AM.  Call this number if you have problems the morning of surgery: [phone]   Remember:   Do not eat food:After Midnight.  May have clear liquids: up to 4 Hours before arrival.  Clear liquids include soda, tea, black coffee, apple or grape juice, broth.  Take these medicines the morning of surgery with A SIP OF WATER:                                                                                     apresoline,isordil, prilosec   Do not wear jewelry, make-up or nail polish.  Do not wear lotions, powders, or perfumes. You may wear deodorant.  Do not shave 48 hours prior to surgery.  Do not bring valuables to the hospital.  Contacts, dentures or bridgework may not be worn into surgery.  Leave suitcase in the car. After surgery it may be brought to your room.  For patients admitted to the hospital, checkout time is 11:00 AM the day of discharge.   Patients discharged the day of surgery will not be allowed to drive home.  Name and phone number of your driver:  With daughter   Special Instructions: CHG Shower Use Special Wash: 1/2 bottle night before surgery and 1/2 bottle morning of surgery.   Please read over the following fact sheets that you were given: Pain Booklet, Coughing and Deep Breathing and Surgical Site Infection Prevention

## 2012-01-28 NOTE — Progress Notes (Signed)
Call to Dr. Lady Saucier, requested last OV note, ekg, & stress test.

## 2012-01-30 ENCOUNTER — Ambulatory Visit (HOSPITAL_COMMUNITY): Payer: Medicare Other | Admitting: Anesthesiology

## 2012-01-30 ENCOUNTER — Encounter (HOSPITAL_COMMUNITY)
Admission: RE | Disposition: A | Discharge status: Home / Self Care | Payer: Self-pay | Source: Ambulatory Visit | Attending: Ophthalmology

## 2012-01-30 ENCOUNTER — Encounter (HOSPITAL_COMMUNITY): Payer: Self-pay | Admitting: *Deleted

## 2012-01-30 ENCOUNTER — Encounter (HOSPITAL_COMMUNITY): Payer: Self-pay | Admitting: Anesthesiology

## 2012-01-30 ENCOUNTER — Ambulatory Visit (HOSPITAL_COMMUNITY)
Admission: RE | Admit: 2012-01-30 | Discharge: 2012-01-30 | Disposition: A | Discharge status: Home / Self Care | Payer: Medicare Other | Source: Ambulatory Visit | Attending: Ophthalmology | Admitting: Ophthalmology

## 2012-01-30 DIAGNOSIS — Z01812 Encounter for preprocedural laboratory examination: Secondary | ICD-10-CM | POA: Insufficient documentation

## 2012-01-30 DIAGNOSIS — K219 Gastro-esophageal reflux disease without esophagitis: Secondary | ICD-10-CM | POA: Insufficient documentation

## 2012-01-30 DIAGNOSIS — I10 Essential (primary) hypertension: Secondary | ICD-10-CM | POA: Insufficient documentation

## 2012-01-30 DIAGNOSIS — Z87891 Personal history of nicotine dependence: Secondary | ICD-10-CM | POA: Insufficient documentation

## 2012-01-30 DIAGNOSIS — K08109 Complete loss of teeth, unspecified cause, unspecified class: Secondary | ICD-10-CM | POA: Insufficient documentation

## 2012-01-30 DIAGNOSIS — H269 Unspecified cataract: Secondary | ICD-10-CM | POA: Insufficient documentation

## 2012-01-30 DIAGNOSIS — I509 Heart failure, unspecified: Secondary | ICD-10-CM | POA: Insufficient documentation

## 2012-01-30 HISTORY — PX: CATARACT EXTRACTION W/PHACO: SHX586

## 2012-01-30 SURGERY — PHACOEMULSIFICATION, CATARACT, WITH IOL INSERTION
Anesthesia: Monitor Anesthesia Care | Laterality: Right | Wound class: Clean

## 2012-01-30 MED ORDER — GATIFLOXACIN 0.5 % OP SOLN
OPHTHALMIC | Status: AC
  Filled 2012-01-30: qty 2.5

## 2012-01-30 MED ORDER — DROPERIDOL 2.5 MG/ML IJ SOLN: 0.6250 mg | INTRAMUSCULAR | Status: DC | PRN

## 2012-01-30 MED ORDER — HYDROMORPHONE HCL PF 1 MG/ML IJ SOLN: 0.2500 mg | INTRAMUSCULAR | Status: DC | PRN

## 2012-01-30 MED ORDER — PREDNISOLONE ACETATE 1 % OP SUSP
OPHTHALMIC | Status: AC
  Administered 2012-01-30: 1 [drp] via OPHTHALMIC
  Filled 2012-01-30: qty 5

## 2012-01-30 MED ORDER — PROPOFOL 10 MG/ML IV EMUL
INTRAVENOUS | Status: DC | PRN
  Administered 2012-01-30: 10 mg via INTRAVENOUS
  Administered 2012-01-30: 40 mg via INTRAVENOUS

## 2012-01-30 MED ORDER — PROPOFOL 10 MG/ML IV EMUL
INTRAVENOUS | Status: DC | PRN
  Administered 2012-01-30: 75 ug/kg/min via INTRAVENOUS

## 2012-01-30 MED ORDER — EPINEPHRINE HCL 1 MG/ML IJ SOLN
INTRAMUSCULAR | Status: DC | PRN
  Administered 2012-01-30: .3 mL

## 2012-01-30 MED ORDER — BACITRACIN-POLYMYXIN B 500-10000 UNIT/GM OP OINT
TOPICAL_OINTMENT | OPHTHALMIC | Status: DC | PRN
  Administered 2012-01-30: 1 via OPHTHALMIC

## 2012-01-30 MED ORDER — DEXAMETHASONE SODIUM PHOSPHATE 10 MG/ML IJ SOLN
INTRAMUSCULAR | Status: DC | PRN
  Administered 2012-01-30: 10 mg

## 2012-01-30 MED ORDER — TETRACAINE HCL 0.5 % OP SOLN
2.0000 [drp] | OPHTHALMIC | Status: AC
  Administered 2012-01-30: 2 [drp] via OPHTHALMIC

## 2012-01-30 MED ORDER — TETRACAINE HCL 0.5 % OP SOLN
OPHTHALMIC | Status: AC
  Administered 2012-01-30: 2 [drp] via OPHTHALMIC
  Filled 2012-01-30: qty 2

## 2012-01-30 MED ORDER — NA CHONDROIT SULF-NA HYALURON 40-30 MG/ML IO SOLN
INTRAOCULAR | Status: DC | PRN
  Administered 2012-01-30: 0.5 mL via INTRAOCULAR

## 2012-01-30 MED ORDER — PHENYLEPHRINE HCL 2.5 % OP SOLN
1.0000 [drp] | OPHTHALMIC | Status: AC | PRN
  Administered 2012-01-30 (×3): 1 [drp] via OPHTHALMIC

## 2012-01-30 MED ORDER — PREDNISOLONE ACETATE 1 % OP SUSP
OPHTHALMIC | Status: AC
  Filled 2012-01-30: qty 5

## 2012-01-30 MED ORDER — BUPIVACAINE HCL 0.75 % IJ SOLN
INTRAMUSCULAR | Status: DC | PRN
  Administered 2012-01-30: 5 mL

## 2012-01-30 MED ORDER — LIDOCAINE HCL (PF) 2 % IJ SOLN
INTRAMUSCULAR | Status: DC | PRN
  Administered 2012-01-30: 5 mL

## 2012-01-30 MED ORDER — SODIUM CHLORIDE 0.9 % IV SOLN: INTRAVENOUS | Status: DC

## 2012-01-30 MED ORDER — GATIFLOXACIN 0.5 % OP SOLN
OPHTHALMIC | Status: AC
  Administered 2012-01-30: 1 [drp] via OPHTHALMIC
  Filled 2012-01-30: qty 2.5

## 2012-01-30 MED ORDER — ONDANSETRON HCL 4 MG/2ML IJ SOLN
INTRAMUSCULAR | Status: DC | PRN
  Administered 2012-01-30: 4 mg via INTRAVENOUS

## 2012-01-30 MED ORDER — PROMETHAZINE HCL 25 MG/ML IJ SOLN: 12.5000 mg | INTRAMUSCULAR | Status: DC | PRN

## 2012-01-30 MED ORDER — PHENYLEPHRINE HCL 2.5 % OP SOLN
OPHTHALMIC | Status: AC
  Administered 2012-01-30: 1 [drp] via OPHTHALMIC
  Filled 2012-01-30: qty 3

## 2012-01-30 MED ORDER — TRIAMCINOLONE ACETONIDE 40 MG/ML IJ SUSP
INTRAMUSCULAR | Status: DC | PRN
  Administered 2012-01-30: 40 mg

## 2012-01-30 MED ORDER — GATIFLOXACIN 0.5 % OP SOLN
1.0000 [drp] | OPHTHALMIC | Status: AC | PRN
  Administered 2012-01-30 (×3): 1 [drp] via OPHTHALMIC

## 2012-01-30 MED ORDER — SODIUM CHLORIDE 0.9 % IV SOLN
10.0000 mg | INTRAVENOUS | Status: DC | PRN
  Administered 2012-01-30: 1 ug/min via INTRAVENOUS

## 2012-01-30 MED ORDER — BALANCED SALT IO SOLN
INTRAOCULAR | Status: DC | PRN
  Administered 2012-01-30: 15 mL via INTRAOCULAR

## 2012-01-30 MED ORDER — PHENYLEPHRINE HCL 10 MG/ML IJ SOLN
INTRAMUSCULAR | Status: DC | PRN
  Administered 2012-01-30 (×3): 80 ug via INTRAVENOUS

## 2012-01-30 MED ORDER — CEFAZOLIN SUBCONJUNCTIVAL INJECTION 100 MG/0.5 ML
200.0000 mg | INJECTION | SUBCONJUNCTIVAL | Status: AC
  Administered 2012-01-30: 200 mg via SUBCONJUNCTIVAL
  Filled 2012-01-30: qty 1

## 2012-01-30 MED ORDER — SODIUM CHLORIDE 0.9 % IV SOLN
INTRAVENOUS | Status: DC
  Administered 2012-01-30: 10:00:00 via INTRAVENOUS

## 2012-01-30 MED ORDER — TRAMADOL HCL 50 MG PO TABS: 50.0000 mg | ORAL_TABLET | ORAL | Status: DC | PRN

## 2012-01-30 MED ORDER — SODIUM CHLORIDE 0.9 % IV SOLN
INTRAVENOUS | Status: DC | PRN
  Administered 2012-01-30 (×2): via INTRAVENOUS

## 2012-01-30 MED ORDER — HYPROMELLOSE (GONIOSCOPIC) 2.5 % OP SOLN
OPHTHALMIC | Status: DC | PRN
  Administered 2012-01-30: 1 [drp] via OPHTHALMIC

## 2012-01-30 MED ORDER — PREDNISOLONE ACETATE 1 % OP SUSP
1.0000 [drp] | OPHTHALMIC | Status: AC
  Administered 2012-01-30: 1 [drp] via OPHTHALMIC

## 2012-01-30 MED ORDER — PROVISC 10 MG/ML IO SOLN
INTRAOCULAR | Status: DC | PRN
  Administered 2012-01-30: .85 mL via INTRAOCULAR

## 2012-01-30 MED ORDER — TETRACAINE HCL 0.5 % OP SOLN
OPHTHALMIC | Status: DC | PRN
  Administered 2012-01-30: 2 [drp] via OPHTHALMIC

## 2012-01-30 SURGICAL SUPPLY — 61 items
APL SRG 3 HI ABS STRL LF PLS (MISCELLANEOUS) ×1
APPLICATOR COTTON TIP 6IN STRL (MISCELLANEOUS) ×2 IMPLANT
APPLICATOR DR MATTHEWS STRL (MISCELLANEOUS) ×2 IMPLANT
BLADE EYE MINI 60D BEAVER (BLADE) IMPLANT
BLADE KERATOME 2.75 (BLADE) ×2 IMPLANT
BLADE STAB KNIFE 15DEG (BLADE) IMPLANT
CANNULA ANTERIOR CHAMBER 27GA (MISCELLANEOUS) ×1 IMPLANT
CLOTH BEACON ORANGE TIMEOUT ST (SAFETY) ×2 IMPLANT
DRAPE OPHTHALMIC 77X100 STRL (CUSTOM PROCEDURE TRAY) ×2 IMPLANT
DRAPE POUCH INSTRU U-SHP 10X18 (DRAPES) ×2 IMPLANT
DRSG TEGADERM 4X4.75 (GAUZE/BANDAGES/DRESSINGS) ×2 IMPLANT
EYE SHIELD UNIVERSAL CLEAR (GAUZE/BANDAGES/DRESSINGS) ×1 IMPLANT
FILTER BLUE MILLIPORE (MISCELLANEOUS) IMPLANT
GLOVE SS BIOGEL STRL SZ 6.5 (GLOVE) ×1 IMPLANT
GLOVE SUPERSENSE BIOGEL SZ 6.5 (GLOVE) ×1
GLOVE SURG SS PI 6.5 STRL IVOR (GLOVE) ×1 IMPLANT
GOWN PREVENTION PLUS XLARGE (GOWN DISPOSABLE) ×2 IMPLANT
GOWN STRL NON-REIN LRG LVL3 (GOWN DISPOSABLE) ×2 IMPLANT
KIT ROOM TURNOVER OR (KITS) ×1 IMPLANT
KNIFE GRIESHABER SHARP 2.5MM (MISCELLANEOUS) ×2 IMPLANT
LENS IOL ACRYSOF MP POST 20.5 (Intraocular Lens) ×1 IMPLANT
MARKER SKIN DUAL TIP RULER LAB (MISCELLANEOUS) ×2 IMPLANT
NDL 18GX1X1/2 (RX/OR ONLY) (NEEDLE) IMPLANT
NDL 25GX 5/8IN NON SAFETY (NEEDLE) ×1 IMPLANT
NDL FILTER BLUNT 18X1 1/2 (NEEDLE) IMPLANT
NDL HYPO 30X.5 LL (NEEDLE) ×2 IMPLANT
NEEDLE 18GX1X1/2 (RX/OR ONLY) (NEEDLE) IMPLANT
NEEDLE 22X1 1/2 (OR ONLY) (NEEDLE) ×2 IMPLANT
NEEDLE 25GX 5/8IN NON SAFETY (NEEDLE) ×2 IMPLANT
NEEDLE FILTER BLUNT 18X 1/2SAF (NEEDLE)
NEEDLE FILTER BLUNT 18X1 1/2 (NEEDLE) IMPLANT
NEEDLE HYPO 30X.5 LL (NEEDLE) ×4 IMPLANT
NS IRRIG 1000ML POUR BTL (IV SOLUTION) ×2 IMPLANT
PACK CATARACT CUSTOM (CUSTOM PROCEDURE TRAY) ×2 IMPLANT
PACK CATARACT MCHSCP (PACKS) ×1 IMPLANT
PACK COMBINED CATERACT/VIT 23G (OPHTHALMIC RELATED) IMPLANT
PAD ARMBOARD 7.5X6 YLW CONV (MISCELLANEOUS) ×4 IMPLANT
PAD EYE OVAL STERILE LF (GAUZE/BANDAGES/DRESSINGS) ×2 IMPLANT
PHACO TIP KELMAN 45DEG (TIP) ×2 IMPLANT
PROBE ANTERIOR 20G W/INFUS NDL (MISCELLANEOUS) IMPLANT
ROLLS DENTAL (MISCELLANEOUS) ×2 IMPLANT
SHUTTLE MONARCH TYPE A (NEEDLE) ×2 IMPLANT
SOLUTION ANTI FOG 6CC (MISCELLANEOUS) ×1 IMPLANT
SPEAR EYE SURG WECK-CEL (MISCELLANEOUS) ×4 IMPLANT
SUT ETHILON 10-0 CS-B-6CS-B-6 (SUTURE)
SUT ETHILON 5 0 P 3 18 (SUTURE)
SUT ETHILON 9 0 TG140 8 (SUTURE) IMPLANT
SUT NYLON ETHILON 5-0 P-3 1X18 (SUTURE) IMPLANT
SUT PLAIN 6 0 TG1408 (SUTURE) IMPLANT
SUT POLY NON ABSORB 10-0 8 STR (SUTURE) IMPLANT
SUT VICRYL 6 0 S 29 12 (SUTURE) IMPLANT
SUTURE EHLN 10-0 CS-B-6CS-B-6 (SUTURE) IMPLANT
SYR 20CC LL (SYRINGE) IMPLANT
SYR 5ML LL (SYRINGE) IMPLANT
SYR TB 1ML LUER SLIP (SYRINGE) IMPLANT
SYRINGE 10CC LL (SYRINGE) IMPLANT
TAPE SURG TRANSPORE 1 IN (GAUZE/BANDAGES/DRESSINGS) IMPLANT
TAPE SURGICAL TRANSPORE 1 IN (GAUZE/BANDAGES/DRESSINGS) ×1
TOWEL OR 17X24 6PK STRL BLUE (TOWEL DISPOSABLE) ×4 IMPLANT
WATER STERILE IRR 1000ML POUR (IV SOLUTION) ×2 IMPLANT
WIPE INSTRUMENT VISIWIPE 73X73 (MISCELLANEOUS) ×2 IMPLANT

## 2012-01-30 NOTE — H&P (View-Only) (Signed)
Pre-operative History and Physical for Ophthalmic Surgery  [name] [name] 01/21/2012                  Chief Complaint: Decreased Vision  Diagnosis: Cataract  No Known Allergies   Prior to Admission medications   Medication Sig Start Date End Date Taking? Authorizing Provider  alum & mag hydroxide-simeth (MYLANTA) 200-200-20 MG/5ML suspension Give pt two teaspoons of Mylanta after meals and at bedtime. 01/14/12   Carleene Cooper III, MD  Alum Hydroxide-Mag Carbonate (GAVISCON PO) Take 15 mLs by mouth daily.    Historical Provider, MD  atorvastatin (LIPITOR) 10 MG tablet Take 10 mg by mouth daily.      Historical Provider, MD  furosemide (LASIX) 40 MG tablet Take 40 mg by mouth daily.      Historical Provider, MD  hydrALAZINE (APRESOLINE) 25 MG tablet Take 25 mg by mouth 2 (two) times daily.     Historical Provider, MD  HYDROcodone-acetaminophen (VICODIN) 5-500 MG per tablet Take 1 tablet by mouth every 4 (four) hours as needed. For back pain     Historical Provider, MD  ibuprofen (ADVIL,MOTRIN) 200 MG tablet Take 200 mg by mouth daily.    Historical Provider, MD  isosorbide dinitrate (ISORDIL) 20 MG tablet Take 20 mg by mouth 2 (two) times daily.      Historical Provider, MD  naproxen sodium (ANAPROX) 220 MG tablet Take 220 mg by mouth daily.    Historical Provider, MD  omeprazole (PRILOSEC) 20 MG capsule Take 1 capsule (20 mg total) by mouth daily. 11/19/11 11/18/12  Celene Kras, MD  potassium chloride SA (K-DUR,KLOR-CON) 20 MEQ tablet Take 20 mEq by mouth daily.      Historical Provider, MD  ramipril (ALTACE) 2.5 MG capsule Take 2.5 mg by mouth daily.      Historical Provider, MD  Tamsulosin HCl (FLOMAX) 0.4 MG CAPS Take 0.4 mg by mouth daily.      Historical Provider, MD    Planned Procedure:    Phacoemulsification, Posterior Chamber Intra-ocular Lens OD  There were no vitals filed for this visit.  Pulse: 70 irreg, irreg         Temp: NE       Resp:  18      ROS:  non-contributory   Past Medical History  Diagnosis Date  . Hypertension   . Arthritis   . CHF (congestive heart failure)   . GERD (gastroesophageal reflux disease)     Past Surgical History  Procedure Date  . Evacuation of subdural hematoma      History   Social History  . Marital Status: Widowed    Spouse Name: N/A    Number of Children: N/A  . Years of Education: N/A   Occupational History  . Not on file.   Social History Main Topics  . Smoking status: Former Smoker -- 0.2 packs/day for 15 years    Quit date: 12/24/1994  . Smokeless tobacco: Not on file  . Alcohol Use: No  . Drug Use: No  . Sexually Active:    Other Topics Concern  . Not on file   Social History Narrative  . No narrative on file     The following examination is for anesthesia clearance for minimally invasive Ophthalmic surgery. It is primarily to document heart and lung findings and is not intended to elucidate unknown general medical conditions inclusive of abdominal masses, lung lesions, etc.   General Constitution:  within normal limits  Alertness/Orientation:  Person, time place     yes   HEENT:  Eye Findings: Cataract OD                   right eye  Neck: supple without masses  Chest/Lungs: clear to auscultation  Cardiac: Normal S1 and S2 without Murmur, S3 or S4  Neuro: non-focal   Impression: Visually Significant Cataract OD  Planned Procedure:  Phacoemulsification, Posterior Chamber Intraocular Lens OD     Shade Flood, MD

## 2012-01-30 NOTE — Interval H&P Note (Signed)
History and Physical Interval Note:  01/30/2012 8:39 AM  [name] [name]  has presented today for surgery, with the diagnosis of Combined Cataract  The various methods of treatment have been discussed with the patient and family. After consideration of risks, benefits and other options for treatment, the patient has consented to  Procedure(s): CATARACT EXTRACTION PHACO AND INTRAOCULAR LENS PLACEMENT (IOC) as a surgical intervention .  The patients' history has been reviewed, patient examined, no change in status, stable for surgery.  I have reviewed the patients' chart and labs.  Questions were answered to the patient's satisfaction.     Shade Flood, MD

## 2012-01-30 NOTE — Anesthesia Procedure Notes (Signed)
Procedure Name: LMA Insertion Date/Time: 01/30/2012 10:38 AM Performed by: Caryn Bee Pre-anesthesia Checklist: Patient identified, Emergency Drugs available, Suction available, Patient being monitored and Timeout performed Patient Re-evaluated:Patient Re-evaluated prior to inductionOxygen Delivery Method: Circle System Utilized Preoxygenation: Pre-oxygenation with 100% oxygen Intubation Type: IV induction LMA: LMA inserted LMA Size: 4.0 Number of attempts: 1 Placement Confirmation: positive ETCO2 and breath sounds checked- equal and bilateral Tube secured with: Tape Dental Injury: Teeth and Oropharynx as per pre-operative assessment

## 2012-01-30 NOTE — Op Note (Signed)
[name] [name] 01/30/2012 Cataract  Procedure: Phacoemulsification, Posterior Chamber Intra-ocular Lens Operative Eye:  right eye  Surgeon: Shade Flood Estimated Blood Loss: minimal Specimens for Pathology:  None Complications: capsule tear after sudden movement by patient  The patient was prepared and draped in the usual manner for ocular surgery on the right eye. A Cook lid speculum was placed. A peripheral clear corneal incision was made at the surgical limbus centered at the 11:00 meridian. A separate clear corneal stab incision was made with a 15 degree blade at the 2:00 meridian to permit bi-manual technique. Provisc was instilled into the anterior chamber through that incision.  A keratome was used to create a self sealing incision entering the anterior chamber at the 11:00 meridian. A capsulorhexis was performed using a bent 25g needle. The lens was hydrodissected and the nucleus was hydrodilineated using a Nichammin cannula. The Chang chopper was inserted and used to rotate the lens to insure adequate lens mobility. The phacoemulsification handpiece was inserted and a combined phaco-chop technique was employed, fracturing the lens into separate sections with subsequent removal with the phaco handpiece. As I was removing the final nuclear fragment, the patient suddenly moved his head to the right. The was an apparent inferior posterior capsule tear. I removed the remaining cortex with the  I/A cannula.  Provisc was instilled and used to deepen the anterior chamber and to elevate the iris in front of the anterior capsule.  The Monarch injector was used to place a folded Acrysof [id number] PC IOL, + 20.50  diopters, into the sulcus. A Sinskey lens hook was used to dial in the trailing haptic.  There was not vitreous noted in the anterior chamber.   The I/A cannula was used to remove the viscoelastic from the anterior chamber. BSS was used to bring IOP to the desired range and the wound was checked to  insure it was watertight. The corneal incisions were hydrated with BSS and there was not wound leakage when checked with a Weck cell sponge. Subconjunctival injections of Ancef 100mg /0.28ml and Dexamethasone 4mg /9ml were placed without complication. A posterior sub-tenons injection of Kenalog was placed in the supero-temporal quadrant without complication. The lid speculum and drapes were removed and the patient's eye was patched with Polymixin/Bacitracin ophthalmic ointment. An eye shield was placed and the patient was transferred alert and conversant from the operating room to the post-operative recovery area.   Shade Flood, MD

## 2012-01-30 NOTE — OR Nursing (Signed)
Pt. Converted from MAC to LMA anesthesia at 1038 per Dr. Anne Hahn request due to pt.'s inability to stay still.

## 2012-01-30 NOTE — Anesthesia Postprocedure Evaluation (Signed)
Anesthesia Post Note  Patient: [name] [name]  Procedure(s) Performed:  CATARACT EXTRACTION PHACO AND INTRAOCULAR LENS PLACEMENT (IOC)  Anesthesia type: general  Patient location: PACU  Post pain: Pain level controlled  Post assessment: Patient's Cardiovascular Status Stable  Last Vitals:  Filed Vitals:   01/30/12 1245  BP: 134/55  Pulse: 50  Temp:   Resp: 14    Post vital signs: Reviewed and stable  Level of consciousness: sedated  Complications: No apparent anesthesia complications

## 2012-01-30 NOTE — Transfer of Care (Signed)
Immediate Anesthesia Transfer of Care Note  Patient: [name] [name]  Procedure(s) Performed:  CATARACT EXTRACTION PHACO AND INTRAOCULAR LENS PLACEMENT (IOC)  Patient Location: PACU  Anesthesia Type: General  Level of Consciousness: awake and sedated  Airway & Oxygen Therapy: Patient Spontanous Breathing and Patient connected to nasal cannula oxygen  Post-op Assessment: Report given to PACU RN and Post -op Vital signs reviewed and stable  Post vital signs: Reviewed and stable  Complications: No apparent anesthesia complications

## 2012-01-30 NOTE — Anesthesia Preprocedure Evaluation (Signed)
Anesthesia Evaluation  Patient identified by MRN, date of birth, ID band Patient awake    Reviewed: Allergy & Precautions, H&P , NPO status , Patient's Chart, lab work & pertinent test results  History of Anesthesia Complications Negative for: history of anesthetic complications  Airway Mallampati: I TM Distance: >3 FB     Dental  (+) Edentulous Upper, Edentulous Lower and Dental Advisory Given   Pulmonary Recent URI , Resolved, former smoker clear to auscultation  Pulmonary exam normal       Cardiovascular hypertension, Pt. on medications +CHF Regular Normal    Neuro/Psych    GI/Hepatic Neg liver ROS, GERD-  Medicated,  Endo/Other  Negative Endocrine ROS  Renal/GU negative Renal ROS     Musculoskeletal   Abdominal   Peds  Hematology   Anesthesia Other Findings   Reproductive/Obstetrics                           Anesthesia Physical Anesthesia Plan  ASA: III  Anesthesia Plan: MAC and General   Post-op Pain Management:    Induction:   Airway Management Planned: Mask and Oral ETT  Additional Equipment:   Intra-op Plan:   Post-operative Plan: Extubation in OR  Informed Consent: I have reviewed the patients History and Physical, chart, labs and discussed the procedure including the risks, benefits and alternatives for the proposed anesthesia with the patient or authorized representative who has indicated his/her understanding and acceptance.   Dental advisory given  Plan Discussed with: CRNA, Anesthesiologist and Surgeon  Anesthesia Plan Comments:         Anesthesia Quick Evaluation

## 2012-02-01 ENCOUNTER — Encounter (HOSPITAL_COMMUNITY): Payer: Self-pay | Admitting: Ophthalmology

## 2012-02-04 ENCOUNTER — Other Ambulatory Visit: Payer: Self-pay | Admitting: Cardiology

## 2012-02-04 DIAGNOSIS — M545 Low back pain: Secondary | ICD-10-CM

## 2012-02-08 ENCOUNTER — Ambulatory Visit
Admission: RE | Admit: 2012-02-08 | Discharge: 2012-02-08 | Disposition: A | Discharge status: Home / Self Care | Payer: Medicare Other | Source: Ambulatory Visit | Attending: Cardiology | Admitting: Cardiology

## 2012-02-08 DIAGNOSIS — M545 Low back pain: Secondary | ICD-10-CM

## 2012-04-05 ENCOUNTER — Emergency Department (HOSPITAL_COMMUNITY)
Admission: EM | Admit: 2012-04-05 | Discharge: 2012-04-05 | Disposition: A | Discharge status: Home / Self Care | Payer: Medicare Other | Attending: Emergency Medicine | Admitting: Emergency Medicine

## 2012-04-05 ENCOUNTER — Encounter (HOSPITAL_COMMUNITY): Payer: Self-pay

## 2012-04-05 ENCOUNTER — Emergency Department (HOSPITAL_COMMUNITY): Payer: Medicare Other

## 2012-04-05 DIAGNOSIS — M545 Low back pain, unspecified: Secondary | ICD-10-CM | POA: Insufficient documentation

## 2012-04-05 DIAGNOSIS — M549 Dorsalgia, unspecified: Secondary | ICD-10-CM

## 2012-04-05 DIAGNOSIS — I509 Heart failure, unspecified: Secondary | ICD-10-CM | POA: Insufficient documentation

## 2012-04-05 DIAGNOSIS — I10 Essential (primary) hypertension: Secondary | ICD-10-CM | POA: Insufficient documentation

## 2012-04-05 DIAGNOSIS — W1789XA Other fall from one level to another, initial encounter: Secondary | ICD-10-CM | POA: Insufficient documentation

## 2012-04-05 DIAGNOSIS — M25559 Pain in unspecified hip: Secondary | ICD-10-CM | POA: Insufficient documentation

## 2012-04-05 DIAGNOSIS — Z79899 Other long term (current) drug therapy: Secondary | ICD-10-CM | POA: Insufficient documentation

## 2012-04-05 DIAGNOSIS — I359 Nonrheumatic aortic valve disorder, unspecified: Secondary | ICD-10-CM | POA: Insufficient documentation

## 2012-04-05 DIAGNOSIS — Y92009 Unspecified place in unspecified non-institutional (private) residence as the place of occurrence of the external cause: Secondary | ICD-10-CM | POA: Insufficient documentation

## 2012-04-05 DIAGNOSIS — M129 Arthropathy, unspecified: Secondary | ICD-10-CM | POA: Insufficient documentation

## 2012-04-05 DIAGNOSIS — S3992XA Unspecified injury of lower back, initial encounter: Secondary | ICD-10-CM

## 2012-04-05 DIAGNOSIS — K219 Gastro-esophageal reflux disease without esophagitis: Secondary | ICD-10-CM | POA: Insufficient documentation

## 2012-04-05 DIAGNOSIS — IMO0002 Reserved for concepts with insufficient information to code with codable children: Secondary | ICD-10-CM | POA: Insufficient documentation

## 2012-04-05 LAB — CBC
MCHC: 33.5 g/dL (ref 30.0–36.0)
MCV: 76.7 fL — ABNORMAL LOW (ref 78.0–100.0)
Platelets: 207 10*3/uL (ref 150–400)
RDW: 17.5 % — ABNORMAL HIGH (ref 11.5–15.5)
WBC: 3.7 10*3/uL — ABNORMAL LOW (ref 4.0–10.5)

## 2012-04-05 LAB — POCT I-STAT, CHEM 8
BUN: 6 mg/dL (ref 6–23)
Calcium, Ion: 1.17 mmol/L (ref 1.12–1.32)
HCT: 42 % (ref 39.0–52.0)
Hemoglobin: 14.3 g/dL (ref 13.0–17.0)
Sodium: 142 mEq/L (ref 135–145)
TCO2: 24 mmol/L (ref 0–100)

## 2012-04-05 LAB — DIFFERENTIAL
Basophils Absolute: 0 10*3/uL (ref 0.0–0.1)
Basophils Relative: 1 % (ref 0–1)
Eosinophils Relative: 6 % — ABNORMAL HIGH (ref 0–5)
Lymphocytes Relative: 51 % — ABNORMAL HIGH (ref 12–46)
Monocytes Absolute: 0.4 10*3/uL (ref 0.1–1.0)

## 2012-04-05 MED ORDER — SODIUM CHLORIDE 0.9 % IV SOLN
Freq: Once | INTRAVENOUS | Status: AC
  Administered 2012-04-05: 12:00:00 via INTRAVENOUS

## 2012-04-05 MED ORDER — IOHEXOL 350 MG/ML SOLN
80.0000 mL | Freq: Once | INTRAVENOUS | Status: AC | PRN
  Administered 2012-04-05: 80 mL via INTRAVENOUS

## 2012-04-05 MED ORDER — TRAMADOL HCL 50 MG PO TABS
50.0000 mg | ORAL_TABLET | Freq: Once | ORAL | Status: AC
  Administered 2012-04-05: 50 mg via ORAL
  Filled 2012-04-05: qty 1

## 2012-04-05 MED ORDER — TRAMADOL HCL 50 MG PO TABS: 50.0000 mg | ORAL_TABLET | Freq: Four times a day (QID) | ORAL | Status: AC | PRN

## 2012-04-05 NOTE — ED Provider Notes (Cosign Needed)
[name] [name] is a 78 y.o. male who is in the ED being evaluated for back pain after a fall. CT imaging was done and showed abnormalities of the aorta. Dr. Alto Denver consulted with Dr. Zenaida Niece. Tright. The CVTS surgeon has evaluated the patient and informed ED providers that the patient has apparent atherosclerotic disease, but no acute abnormality of the aorta. The patient's evaluation has been done today is otherwise normal. He can therefore, be treated symptomatically and expectantly.   Flint Melter, MD 04/06/12 [phone]

## 2012-04-05 NOTE — Consult Note (Signed)
NAMEMarland [name]  [name], [name] NO.:  [id number]  MEDICAL RECORD NO.:  [id number]  LOCATION:  [facility]                        FACILITY:  [facility]  PHYSICIAN:  Kerin Perna, M.D.  DATE OF BIRTH:  1923/04/16  DATE OF CONSULTATION: DATE OF DISCHARGE:  04/05/2012                                CONSULTATION   PHYSICIAN REQUESTING CONSULTATION:  Cyndra Numbers, MD, [place]  REASON FOR CONSULTATION:  Questionable abnormality on CT scan of the chest.  CHIEF COMPLAINT:  Low back pain, left hip pain.  HISTORY OF PRESENT ILLNESS:  This is an 78 year old African American male with history of hypertension who presented to the emergency department several days after a fall complaining of left hip and low back pain.  Radiographic images were taken, which showed no fracture and the patient subsequently had a CTA of the thoracic and abdominal aorta. I was asked to evaluate the patient for questionable abnormality on the thoracic aorta.  There is a small irregularity at the lesser curvature of the proximal arch, 2-3 mm, which was questioned as a possible ulcer. The ascending aorta measures approximately 3.8 cm and is not aneurysmal. There is no intramural hematoma.  There is no false lumen or dissection. The descending thoracic aorta has some thickening and atherosclerotic changes diffusely, but is not enlarged.  The patient's lung windows show no pulmonary mass or pleural effusion.  There is no pericardial effusion on the CT scan.  The patient denies chest pain.  He states he has had a previous stress test or echo by Dr. Shana Chute, but he is not sure of the results as this was several years ago.  The patient does take Lipitor and he takes in any coagulant for atrial fibrillation and he takes blood pressure medication.  HOME MEDICATIONS: 1. Hydralazine 25 mg b.i.d. 2. Ramipril 2.5 mg daily. 3. Flomax 0.4 mg daily. 4. Tramadol p.r.n. pain. 5. Lipitor 10 mg daily.  PAST MEDICAL  HISTORY: 1. Hypertension. 2. CHF, chronic atrial fibrillation. 3. GERD. 4. COPD. 5. Arthritis.  PHYSICAL EXAMINATION:  GENERA:  The patient is an elderly black male, in the ED nonacute side accompanied by family, in no acute distress. VITAL SIGNS:  Pulse 75-80, atrial fibrillation.  Blood pressure stable, afebrile. NECK:  No JVD, mass, or bruit. THORAX:  No tenderness or deformity.  Breath sounds clear and equal. CARDIAC:  Irregular rhythm without gallop or murmur. ABDOMEN:  Soft.  LABORATORY DATA:  CT angiogram of the chest reviewed.  Results are as noted above.  The patient has some mild atherosclerotic changes.  RECOMMENDATION:  Continued treatment with Lipitor, blood pressure control.  The thoracic aorta is not enlarged, and I would not recommend repeat CTA based on the findings today due to the patient's underlying probable renal disease.  Further followup of an abdominal aneurysm would be best performed through the [facility].     Kerin Perna, M.D.     PV/MEDQ  D:  04/05/2012  T:  04/05/2012  Job:  [id number]  cc:   Osvaldo Shipper. Spruill, M.D.

## 2012-04-05 NOTE — ED Notes (Signed)
Pt returned to exam room from CT scan, placed back on cardiac monitor.

## 2012-04-05 NOTE — ED Notes (Signed)
Pt. Stated, i fell going up the steps 2 weeks ago and my lower back started to hurt in about week and half .

## 2012-04-05 NOTE — ED Notes (Signed)
Patient transported to X-ray 

## 2012-04-05 NOTE — Discharge Instructions (Signed)
Use heat on the sore area 3-4 times a day. See yout doctor for problems.    Back Pain, Adult Low back pain is very common. About 1 in 5 people have back pain.The cause of low back pain is rarely dangerous. The pain often gets better over time.About half of people with a sudden onset of back pain feel better in just 2 weeks. About 8 in 10 people feel better by 6 weeks.  CAUSES Some common causes of back pain include:  Strain of the muscles or ligaments supporting the spine.   Wear and tear (degeneration) of the spinal discs.   Arthritis.   Direct injury to the back.  DIAGNOSIS Most of the time, the direct cause of low back pain is not known.However, back pain can be treated effectively even when the exact cause of the pain is unknown.Answering your caregiver's questions about your overall health and symptoms is one of the most accurate ways to make sure the cause of your pain is not dangerous. If your caregiver needs more information, he or she may order lab work or imaging tests (X-rays or MRIs).However, even if imaging tests show changes in your back, this usually does not require surgery. HOME CARE INSTRUCTIONS For many people, back pain returns.Since low back pain is rarely dangerous, it is often a condition that people can learn to [facility] their own.   Remain active. It is stressful on the back to sit or stand in one place. Do not sit, drive, or stand in one place for more than 30 minutes at a time. Take short walks on level surfaces as soon as pain allows.Try to increase the length of time you walk each day.   Do not stay in bed.Resting more than 1 or 2 days can delay your recovery.   Do not avoid exercise or work.Your body is made to move.It is not dangerous to be active, even though your back may hurt.Your back will likely heal faster if you return to being active before your pain is gone.   Pay attention to your body when you bend and lift. Many people have less  discomfortwhen lifting if they bend their knees, keep the load close to their bodies,and avoid twisting. Often, the most comfortable positions are those that put less stress on your recovering back.   Find a comfortable position to sleep. Use a firm mattress and lie on your side with your knees slightly bent. If you lie on your back, put a pillow under your knees.   Only take over-the-counter or prescription medicines as directed by your caregiver. Over-the-counter medicines to reduce pain and inflammation are often the most helpful.Your caregiver may prescribe muscle relaxant drugs.These medicines help dull your pain so you can more quickly return to your normal activities and healthy exercise.   Put ice on the injured area.   Put ice in a plastic bag.   Place a towel between your skin and the bag.   Leave the ice on for 15 to 20 minutes, 3 to 4 times a day for the first 2 to 3 days. After that, ice and heat may be alternated to reduce pain and spasms.   Ask your caregiver about trying back exercises and gentle massage. This may be of some benefit.   Avoid feeling anxious or stressed.Stress increases muscle tension and can worsen back pain.It is important to recognize when you are anxious or stressed and learn ways to manage it.Exercise is a great option.  SEEK MEDICAL  CARE IF:  You have pain that is not relieved with rest or medicine.   You have pain that does not improve in 1 week.   You have new symptoms.   You are generally not feeling well.  SEEK IMMEDIATE MEDICAL CARE IF:   You have pain that radiates from your back into your legs.   You develop new bowel or bladder control problems.   You have unusual weakness or numbness in your arms or legs.   You develop nausea or vomiting.   You develop abdominal pain.   You feel faint.  Document Released: 12/10/2005 Document Revised: 11/29/2011 Document Reviewed: 04/30/2011 [facility] Patient Information 2012 [place],  [place].Back Exercises Back exercises help treat and prevent back injuries. The goal of back exercises is to increase the strength of your abdominal and back muscles and the flexibility of your back. These exercises should be started when you no longer have back pain. Back exercises include:  Pelvic Tilt. Lie on your back with your knees bent. Tilt your pelvis until the lower part of your back is against the floor. Hold this position 5 to 10 sec and repeat 5 to 10 times.   Knee to Chest. Pull first 1 knee up against your chest and hold for 20 to 30 seconds, repeat this with the other knee, and then both knees. This may be done with the other leg straight or bent, whichever feels better.   Sit-Ups or Curl-Ups. Bend your knees 90 degrees. Start with tilting your pelvis, and do a partial, slow sit-up, lifting your trunk only 30 to 45 degrees off the floor. Take at least 2 to 3 seconds for each sit-up. Do not do sit-ups with your knees out straight. If partial sit-ups are difficult, simply do the above but with only tightening your abdominal muscles and holding it as directed.   Hip-Lift. Lie on your back with your knees flexed 90 degrees. Push down with your feet and shoulders as you raise your hips a couple inches off the floor; hold for 10 seconds, repeat 5 to 10 times.   Back arches. Lie on your stomach, propping yourself up on bent elbows. Slowly press on your hands, causing an arch in your low back. Repeat 3 to 5 times. Any initial stiffness and discomfort should lessen with repetition over time.   Shoulder-Lifts. Lie face down with arms beside your body. Keep hips and torso pressed to floor as you slowly lift your head and shoulders off the floor.  Do not overdo your exercises, especially in the beginning. Exercises may cause you some mild back discomfort which lasts for a few minutes; however, if the pain is more severe, or lasts for more than 15 minutes, do not continue exercises until you see your  caregiver. Improvement with exercise therapy for back problems is slow.  See your caregivers for assistance with developing a proper back exercise program. Document Released: 01/17/2005 Document Revised: 11/29/2011 Document Reviewed: 12/10/2005 [facility] Patient Information 2012 [place], [place].

## 2012-04-05 NOTE — ED Notes (Signed)
Pt discharged home. Had no further questions. 

## 2012-04-05 NOTE — ED Notes (Signed)
Pt transported to CT scan.

## 2012-04-05 NOTE — ED Provider Notes (Signed)
History     CSN: [id number]  Arrival date & time 04/05/12  0744   First MD Initiated Contact with Patient 04/05/12 0805      Chief Complaint  Patient presents with  . Fall    (Consider location/radiation/quality/duration/timing/severity/associated sxs/prior treatment) HPI Patient is an 78 year old male seen by myself in stretcher triaged for lower left back pain. Patient reports he fell off his porch onto his left side 2 weeks ago. One week ago he began having pain in about the same place. Patient has slight difficulty with ambulation. He denies any chest pain or shortness of breath. Patient has notable blood pressure of 120/104. On repeat this is improved. Patient denies any other symptoms whatsoever. There are no other associated or modifying factors.  Past Medical History  Diagnosis Date  . Hypertension   . CHF (congestive heart failure)   . GERD (gastroesophageal reflux disease)   . Recurrent upper respiratory infection (URI)     recent cold- treating /w OTC  . Arthritis     "legs are stiff" , had spinal injections for pain relieve 3-4 yrs. ago    Past Surgical History  Procedure Date  . Evacuation of subdural hematoma   . Eye surgery     cataract extracted in L eye  . Hernia repair     R side inguinal repair   . Brain surgery     2002  . Tonsillectomy     1957  . Cataract extraction w/phaco 01/30/2012    Procedure: CATARACT EXTRACTION PHACO AND INTRAOCULAR LENS PLACEMENT (IOC);  Surgeon: Shade Flood, MD;  Location: [facility] OR;  Service: Ophthalmology;  Laterality: Right;    Family History  Problem Relation Age of Onset  . Anesthesia problems Neg Hx   . Hypotension Neg Hx   . Malignant hyperthermia Neg Hx   . Pseudochol deficiency Neg Hx     History  Substance Use Topics  . Smoking status: Former Smoker -- 0.2 packs/day for 15 years    Quit date: 12/24/1994  . Smokeless tobacco: Not on file  . Alcohol Use: No      Review of Systems  Constitutional:  Negative.   HENT: Negative.   Eyes: Negative.   Respiratory: Negative.   Cardiovascular: Negative.   Gastrointestinal: Negative.   Genitourinary: Negative.   Musculoskeletal: Positive for back pain.       Left hip pain  Skin: Negative.   Neurological: Negative.   Hematological: Negative.   Psychiatric/Behavioral: Negative.   All other systems reviewed and are negative.    Allergies  Review of patient's allergies indicates no known allergies.  Home Medications   Current Outpatient Rx  Name Route Sig Dispense Refill  . ATORVASTATIN CALCIUM 10 MG PO TABS Oral Take 10 mg by mouth daily.     Marland Kitchen HYDRALAZINE HCL 25 MG PO TABS Oral Take 25 mg by mouth 2 (two) times daily.     Marland Kitchen POTASSIUM CHLORIDE CRYS ER 20 MEQ PO TBCR Oral Take 20 mEq by mouth daily.     Marland Kitchen RAMIPRIL 2.5 MG PO CAPS Oral Take 2.5 mg by mouth daily.     Marland Kitchen TAMSULOSIN HCL 0.4 MG PO CAPS Oral Take 0.4 mg by mouth daily.       BP 145/93  Pulse 62  Temp(Src) 98.5 F (36.9 C) (Oral)  Resp 16  SpO2 96%  Physical Exam  Nursing note and vitals reviewed. GEN: Well-developed, well-nourished male in no distress HEENT: Atraumatic, normocephalic. Oropharynx clear without erythema  EYES: PERRLA BL, no scleral icterus. NECK: Trachea midline, no meningismus CV: regular rate and rhythm. No murmurs, rubs, or gallops PULM: No respiratory distress.  No crackles, wheezes, or rales. GI: soft, non-tender. No guarding, rebound, or tenderness. + bowel sounds  GU: deferred Neuro: cranial nerves 2-12 intact, no abnormalities of strength or sensation, A and O x 3 MSK: Patient moves all 4 extremities symmetrically, tender to palpation over the left lower flank extending into the left hip. No gross deformities appreciated.  Skin: No rashes petechiae, purpura, or jaundice Psych: no abnormality of mood   ED Course  Procedures (including critical care time)   Labs Reviewed  CBC - Abnormal; Notable for the following:    WBC 3.7 (*)     HCT 38.8 (*)    MCV 76.7 (*)    MCH 25.7 (*)    RDW 17.5 (*)    All other components within normal limits  DIFFERENTIAL - Abnormal; Notable for the following:    Neutrophils Relative 31 (*)    Neutro Abs 1.1 (*)    Lymphocytes Relative 51 (*)    Eosinophils Relative 6 (*)    All other components within normal limits  POCT I-STAT, CHEM 8 - Abnormal; Notable for the following:    Potassium 3.2 (*)    All other components within normal limits   Dg Lumbar Spine Complete  04/05/2012  *RADIOLOGY REPORT*  Clinical Data: Fall with left hip pain.  LUMBAR SPINE - COMPLETE 4+ VIEW  Comparison: 01/14/2012 abdominal radiograph  Findings: There is no evidence of acute fracture, subluxation or dislocation. Degenerative changes within both hips are again noted. No suspicious focal bony lesions are present.  IMPRESSION: No evidence of acute abnormality.  Degenerative changes.  Original Report Authenticated By: Rosendo Gros, M.D.   Dg Hip Complete Left  04/05/2012  *RADIOLOGY REPORT*  Clinical Data: Fall with low back pain.  LEFT HIP - COMPLETE 2+ VIEW  Comparison: 01/14/2012 abdominal radiographs  Findings: Five non-rib bearing lumbar type vertebra are again identified. There is no evidence of acute fracture or subluxation. Moderate to severe degenerative disc disease, facet arthropathy and extensive anterior osteophytosis noted.  No definite focal bony lesions or spondylolysis noted. On the lateral view, there is a suggestion of 3.5 cm abdominal aortic aneurysm.  IMPRESSION: No evidence of acute bony abnormality.  Question 3.5 cm abdominal aortic aneurysm.  Moderate severe degenerative changes.  Original Report Authenticated By: Rosendo Gros, M.D.   Ct Angio Chest W/cm &/or Wo Cm  04/05/2012  *RADIOLOGY REPORT*  Clinical Data:  78 year old with fall and lower back pain. Evaluate aorta.  CT ANGIOGRAPHY CHEST, ABDOMEN AND PELVIS  Technique:  Multidetector CT imaging through the chest, abdomen and pelvis was  performed using the standard protocol during bolus administration of intravenous contrast.  Multiplanar reconstructed images including MIPs were obtained and reviewed to evaluate the vascular anatomy.  Contrast: 80mL OMNIPAQUE IOHEXOL 350 MG/ML SOLN  Comparison:  Chest radiograph 10/05/2011  CTA CHEST  Findings:  The thoracic aorta is patent without dissection or intramural hematoma formation.  There is flow within the main left and right coronary artery along with some coronary artery calcifications.  The ascending thoracic aorta roughly measures 3.7 cm.  There may be a tiny penetrating ulcer along the medial aspect of the upper ascending aorta on sequence 5, image 77.  This measures 3 mm in depth.  The patient has a common trunk for the right innominate artery and left common  carotid artery.  The great vessels are patent.  There is ectasia of the distal right innominate artery.  There is a focal dilatation of the aortic arch at the level of the left subclavian artery measuring up to 3.8 cm. There is atherosclerotic disease and some mural irregularity in the descending thoracic aorta.  Descending thoracic aorta measures 3.7 cm.  Main pulmonary artery is enlarged measuring up to 4.8 cm. Pulmonary arteries are not opacified with contrast.   No significant pericardial or pleural fluid.  There is fluid in the pericardial recess.  There is a pretracheal lymph node on sequence five, image 53 that measures 1.3 cm in short axis.  Trachea and mainstem bronchi are patent.  There is volume loss in the lower lobes bilaterally.  No focal airspace disease.  There is mild bronchiectasis in the left lower lobe.  There is a 5 mm nodule in the right lower lung, best seen on sequence 6, image 113.  The thoracic vertebral bodies are intact.  No gross abnormality to the sternum.  Review of the MIP images confirms the above findings.  IMPRESSION:  No evidence for aortic dissection or intramural hematoma.  There is enlargement and  ectasia of the thoracic aorta and chest thoracic structures.  There may be a tiny penetrating ulcer in the ascending thoracic aorta and mild mural thrombus in the descending thoracic aorta.  Coronary artery calcifications.  Volume loss in the lower lobes.  Indeterminate 5 mm nodule in the right lower lung. If the patient is at high risk for bronchogenic carcinoma, follow-up chest CT at 6-12 months is recommended.  If the patient is at low risk for bronchogenic carcinoma, follow-up chest CT at 12 months is recommended.  This recommendation follows the consensus statement: Guidelines for Management of Small Pulmonary Nodules Detected on CT Scans: A Statement from the Fleischner Society as published in Radiology 2005; 237:395-400.  Prominent lymph node in the pretracheal region.  CTA ABDOMEN AND PELVIS  Findings:  The abdominal aorta is patent.  There is infrarenal abdominal aneurysm measuring 4.5 x 4.7 cm.  Enlargement of the left common iliac artery measuring up to 2.2 cm.  Right common iliac artery measures 1.7 cm.  There is incomplete opacification of the aortic aneurysm and suspect there is mural thrombus along the anterior aspect of the aortic aneurysm.  Poor opacification of the iliac arteries and femoral arteries.  The visceral arteries are patent.  Incidentally, there is a separate origin for the common hepatic artery and splenic artery.  Evaluation of the intra-abdominal structures is limited due to the arterial phase of contrast.  No gross abnormality to the liver or gallbladder.  The right colon is anterior to the liver. Heterogeneous appearance of the spleen due to the arterial phase of contrast.  No gross abnormality of the pancreas or adrenal tissue. Evidence for bilateral renal cysts.  Prostate is markedly enlarged measuring 7.7 cm in the transverse diameter.  There is fluid in the urinary bladder.  There is also nodularity of the posterior prostate gland and seminal vesicles. No significant free fluid.   There are a few prominent periaortic lymph nodes.  Largest is along the posterior right aspect of the abdominal aorta on sequence 5, image 200.  This lymph node measures 2.1 x 1.9 cm.  Etiology of this lymph node is indeterminate.  Multilevel degenerative changes.  Both hips are located.  Disc space loss at L3-L4.   Review of the MIP images confirms the above findings.  IMPRESSION: No acute abdominal or pelvic findings.  Abdominal aortic aneurysm measuring up to 4.7 cm.  No evidence for an aortic dissection.  Enlargement of the prostate gland.  Enlarged periaortic lymph nodes of unknown etiology.  Largest lymph node measures up to 2.1 cm as described.  Original Report Authenticated By: Richarda Overlie, M.D.   Ct Angio Abd/pel W/ And/or W/o  04/05/2012  *RADIOLOGY REPORT*  Clinical Data:  78 year old with fall and lower back pain. Evaluate aorta.  CT ANGIOGRAPHY CHEST, ABDOMEN AND PELVIS  Technique:  Multidetector CT imaging through the chest, abdomen and pelvis was performed using the standard protocol during bolus administration of intravenous contrast.  Multiplanar reconstructed images including MIPs were obtained and reviewed to evaluate the vascular anatomy.  Contrast: 80mL OMNIPAQUE IOHEXOL 350 MG/ML SOLN  Comparison:  Chest radiograph 10/05/2011  CTA CHEST  Findings:  The thoracic aorta is patent without dissection or intramural hematoma formation.  There is flow within the main left and right coronary artery along with some coronary artery calcifications.  The ascending thoracic aorta roughly measures 3.7 cm.  There may be a tiny penetrating ulcer along the medial aspect of the upper ascending aorta on sequence 5, image 77.  This measures 3 mm in depth.  The patient has a common trunk for the right innominate artery and left common carotid artery.  The great vessels are patent.  There is ectasia of the distal right innominate artery.  There is a focal dilatation of the aortic arch at the level of the left  subclavian artery measuring up to 3.8 cm. There is atherosclerotic disease and some mural irregularity in the descending thoracic aorta.  Descending thoracic aorta measures 3.7 cm.  Main pulmonary artery is enlarged measuring up to 4.8 cm. Pulmonary arteries are not opacified with contrast.   No significant pericardial or pleural fluid.  There is fluid in the pericardial recess.  There is a pretracheal lymph node on sequence five, image 53 that measures 1.3 cm in short axis.  Trachea and mainstem bronchi are patent.  There is volume loss in the lower lobes bilaterally.  No focal airspace disease.  There is mild bronchiectasis in the left lower lobe.  There is a 5 mm nodule in the right lower lung, best seen on sequence 6, image 113.  The thoracic vertebral bodies are intact.  No gross abnormality to the sternum.  Review of the MIP images confirms the above findings.  IMPRESSION:  No evidence for aortic dissection or intramural hematoma.  There is enlargement and ectasia of the thoracic aorta and chest thoracic structures.  There may be a tiny penetrating ulcer in the ascending thoracic aorta and mild mural thrombus in the descending thoracic aorta.  Coronary artery calcifications.  Volume loss in the lower lobes.  Indeterminate 5 mm nodule in the right lower lung. If the patient is at high risk for bronchogenic carcinoma, follow-up chest CT at 6-12 months is recommended.  If the patient is at low risk for bronchogenic carcinoma, follow-up chest CT at 12 months is recommended.  This recommendation follows the consensus statement: Guidelines for Management of Small Pulmonary Nodules Detected on CT Scans: A Statement from the Fleischner Society as published in Radiology 2005; 237:395-400.  Prominent lymph node in the pretracheal region.  CTA ABDOMEN AND PELVIS  Findings:  The abdominal aorta is patent.  There is infrarenal abdominal aneurysm measuring 4.5 x 4.7 cm.  Enlargement of the left common iliac artery measuring  up to 2.2  cm.  Right common iliac artery measures 1.7 cm.  There is incomplete opacification of the aortic aneurysm and suspect there is mural thrombus along the anterior aspect of the aortic aneurysm.  Poor opacification of the iliac arteries and femoral arteries.  The visceral arteries are patent.  Incidentally, there is a separate origin for the common hepatic artery and splenic artery.  Evaluation of the intra-abdominal structures is limited due to the arterial phase of contrast.  No gross abnormality to the liver or gallbladder.  The right colon is anterior to the liver. Heterogeneous appearance of the spleen due to the arterial phase of contrast.  No gross abnormality of the pancreas or adrenal tissue. Evidence for bilateral renal cysts.  Prostate is markedly enlarged measuring 7.7 cm in the transverse diameter.  There is fluid in the urinary bladder.  There is also nodularity of the posterior prostate gland and seminal vesicles. No significant free fluid.  There are a few prominent periaortic lymph nodes.  Largest is along the posterior right aspect of the abdominal aorta on sequence 5, image 200.  This lymph node measures 2.1 x 1.9 cm.  Etiology of this lymph node is indeterminate.  Multilevel degenerative changes.  Both hips are located.  Disc space loss at L3-L4.   Review of the MIP images confirms the above findings.  IMPRESSION: No acute abdominal or pelvic findings.  Abdominal aortic aneurysm measuring up to 4.7 cm.  No evidence for an aortic dissection.  Enlargement of the prostate gland.  Enlarged periaortic lymph nodes of unknown etiology.  Largest lymph node measures up to 2.1 cm as described.  Original Report Authenticated By: Richarda Overlie, M.D.     No diagnosis found.    MDM  Patient was evaluated by myself. Based on his chief complaint patient did have plain films performed of the lumbar spine and left hip. Symptoms occurred 1 week following the actual fall. Patient had no concerning  neurologic symptoms including incontinence or focal weakness or sensory deficits. The lateral view on the patient's lumbar film did suggest an abdominal aortic aneurysm. Given the abnormal timeline for the patient's symptoms based on her fall patient did have basic labs performed in a CT angiogram the chest abdomen and pelvis was performed. Thoracic aorta had concerning area with a possible penetrating ulcer and mild mural thrombus.  I spoke with Dr. Zenaida Niece trite from cardiothoracic surgery about this. The patient remained stable and has no chest pain or shortness of breath at this time. His vitals remained stable. Care will be signed out to Dr. Daralene Milch. Please see his dictation with patient final diagnosis and disposition.        Cyndra Numbers, MD 04/05/12 [phone]

## 2012-04-05 NOTE — ED Provider Notes (Signed)
Discussed case with Dr Zenaida Niece trite, CTS. He reviewed scans and evaluated pt. Thinks that irregularity mentioned on CT is more likely atherosclerosis and not an ulcer. Recommending discharge at this time.  Raeford Razor, MD 04/05/12 [phone]

## 2012-04-05 NOTE — ED Notes (Signed)
Cardio at bedside to consult. Pt remains on cardiac monitor. Vital signs stable.

## 2012-04-05 NOTE — ED Notes (Signed)
Pt. Fell 2 weeks ago off his front porch onto his lt. Side. Pt. Did not have pain at the time and about 1 week ago the pain began.  Pt. Can walk but does limp due to the pain in his back.

## 2012-04-05 NOTE — ED Notes (Signed)
Pt. Transferred pt. To [facility], Called report to [place], [place]

## 2012-04-23 ENCOUNTER — Other Ambulatory Visit: Payer: Self-pay | Admitting: Cardiology

## 2012-04-23 DIAGNOSIS — M545 Low back pain: Secondary | ICD-10-CM

## 2012-04-24 ENCOUNTER — Ambulatory Visit
Admission: RE | Admit: 2012-04-24 | Discharge: 2012-04-24 | Disposition: A | Discharge status: Home / Self Care | Payer: Medicare Other | Source: Ambulatory Visit | Attending: Cardiology | Admitting: Cardiology

## 2012-04-24 VITALS — BP 172/98 | HR 88

## 2012-04-24 DIAGNOSIS — M545 Low back pain: Secondary | ICD-10-CM

## 2012-04-24 MED ORDER — METHYLPREDNISOLONE ACETATE 40 MG/ML INJ SUSP (RADIOLOG
120.0000 mg | Freq: Once | INTRAMUSCULAR | Status: AC
  Administered 2012-04-24: 120 mg via EPIDURAL

## 2012-04-24 MED ORDER — IOHEXOL 180 MG/ML  SOLN
1.0000 mL | Freq: Once | INTRAMUSCULAR | Status: AC | PRN
  Administered 2012-04-24: 1 mL via EPIDURAL

## 2012-05-23 ENCOUNTER — Encounter (HOSPITAL_COMMUNITY): Payer: Self-pay | Admitting: Emergency Medicine

## 2012-05-23 ENCOUNTER — Emergency Department (HOSPITAL_COMMUNITY)
Admission: EM | Admit: 2012-05-23 | Discharge: 2012-05-23 | Disposition: A | Discharge status: Home / Self Care | Payer: Medicare Other | Attending: Emergency Medicine | Admitting: Emergency Medicine

## 2012-05-23 ENCOUNTER — Other Ambulatory Visit: Payer: Self-pay

## 2012-05-23 ENCOUNTER — Emergency Department (HOSPITAL_COMMUNITY): Payer: Medicare Other

## 2012-05-23 DIAGNOSIS — K219 Gastro-esophageal reflux disease without esophagitis: Secondary | ICD-10-CM

## 2012-05-23 DIAGNOSIS — R05 Cough: Secondary | ICD-10-CM | POA: Insufficient documentation

## 2012-05-23 DIAGNOSIS — I10 Essential (primary) hypertension: Secondary | ICD-10-CM | POA: Insufficient documentation

## 2012-05-23 DIAGNOSIS — R1013 Epigastric pain: Secondary | ICD-10-CM | POA: Insufficient documentation

## 2012-05-23 DIAGNOSIS — R059 Cough, unspecified: Secondary | ICD-10-CM | POA: Insufficient documentation

## 2012-05-23 LAB — BASIC METABOLIC PANEL
BUN: 11 mg/dL (ref 6–23)
CO2: 22 mEq/L (ref 19–32)
GFR calc non Af Amer: 77 mL/min — ABNORMAL LOW (ref >90)
Glucose, Bld: 115 mg/dL — ABNORMAL HIGH (ref 70–99)
Potassium: 3.4 mEq/L — ABNORMAL LOW (ref 3.5–5.1)
Sodium: 139 mEq/L (ref 135–145)

## 2012-05-23 LAB — DIFFERENTIAL
Eosinophils Absolute: 0.2 10*3/uL (ref 0.0–0.7)
Eosinophils Relative: 5 % (ref 0–5)
Lymphocytes Relative: 57 % — ABNORMAL HIGH (ref 12–46)
Lymphs Abs: 2.1 10*3/uL (ref 0.7–4.0)
Monocytes Relative: 9 % (ref 3–12)
Neutrophils Relative %: 28 % — ABNORMAL LOW (ref 43–77)

## 2012-05-23 LAB — CBC
Hemoglobin: 13.8 g/dL (ref 13.0–17.0)
MCH: 25.9 pg — ABNORMAL LOW (ref 26.0–34.0)
MCV: 74.6 fL — ABNORMAL LOW (ref 78.0–100.0)
Platelets: 165 10*3/uL (ref 150–400)
RBC: 5.32 MIL/uL (ref 4.22–5.81)
WBC: 3.6 10*3/uL — ABNORMAL LOW (ref 4.0–10.5)

## 2012-05-23 LAB — POCT I-STAT TROPONIN I

## 2012-05-23 LAB — LIPASE, BLOOD: Lipase: 31 U/L (ref 11–59)

## 2012-05-23 MED ORDER — GI COCKTAIL ~~LOC~~
30.0000 mL | Freq: Once | ORAL | Status: AC
  Administered 2012-05-23: 30 mL via ORAL
  Filled 2012-05-23 (×2): qty 30

## 2012-05-23 MED ORDER — PANTOPRAZOLE SODIUM 40 MG IV SOLR
40.0000 mg | Freq: Once | INTRAVENOUS | Status: AC
  Administered 2012-05-23: 40 mg via INTRAVENOUS
  Filled 2012-05-23: qty 40

## 2012-05-23 MED ORDER — PANTOPRAZOLE SODIUM 20 MG PO TBEC: 40.0000 mg | DELAYED_RELEASE_TABLET | Freq: Every day | ORAL | Status: DC

## 2012-05-23 NOTE — ED Notes (Signed)
Pt stated that he is having burning sensation in the middle of his upper stomach radiating to the middle of his chest. Pt stated that this feels similar to his acid reflux. Usually when he comes to the hospital for this situation, they give him a GI Cocktail that helps. No nausea or vomiting. No respiratory or cardiac distress. Will continue to monitor.

## 2012-05-23 NOTE — Discharge Instructions (Signed)

## 2012-05-23 NOTE — ED Notes (Signed)
PT. REPORTS EPIGASTRIC/MID CHEST PAIN FOR 3 DAYS , STATES " ACID REFLUX" ,  DENIES SOB , OCCASIONAL DRY COUGH , NO NAUSEA OR DIAPHORESIS.

## 2012-05-23 NOTE — ED Provider Notes (Addendum)
History     CSN: [id number]  Arrival date & time 05/23/12  4098   First MD Initiated Contact with Patient 05/23/12 2035      Chief Complaint  Patient presents with  . Chest Pain    (Consider location/radiation/quality/duration/timing/severity/associated sxs/prior treatment) HPI Pt states his reflux has worsened over the last few days and is asking for a GI cocktail. He report burning pain originating in his epigastrium and radiating up into his chest. Report acidic taste and night time dry coughing. No SOB, fever chills, bloody stool. Pt denies recent NSAID use.   Past Medical History  Diagnosis Date  . Hypertension   . CHF (congestive heart failure)   . GERD (gastroesophageal reflux disease)   . Recurrent upper respiratory infection (URI)     recent cold- treating /w OTC  . Arthritis     "legs are stiff" , had spinal injections for pain relieve 3-4 yrs. ago    Past Surgical History  Procedure Date  . Evacuation of subdural hematoma   . Eye surgery     cataract extracted in L eye  . Hernia repair     R side inguinal repair   . Brain surgery     2002  . Tonsillectomy     1957  . Cataract extraction w/phaco 01/30/2012    Procedure: CATARACT EXTRACTION PHACO AND INTRAOCULAR LENS PLACEMENT (IOC);  Surgeon: Shade Flood, MD;  Location: [facility] OR;  Service: Ophthalmology;  Laterality: Right;    Family History  Problem Relation Age of Onset  . Anesthesia problems Neg Hx   . Hypotension Neg Hx   . Malignant hyperthermia Neg Hx   . Pseudochol deficiency Neg Hx     History  Substance Use Topics  . Smoking status: Former Smoker -- 0.2 packs/day for 15 years    Quit date: 12/24/1994  . Smokeless tobacco: Not on file  . Alcohol Use: No      Review of Systems  Constitutional: Negative for fever and chills.  Respiratory: Positive for cough. Negative for shortness of breath.   Cardiovascular: Negative for chest pain.  Gastrointestinal: Positive for abdominal pain. Negative  for nausea, vomiting, diarrhea and blood in stool.  Neurological: Negative for weakness and numbness.    Allergies  Review of patient's allergies indicates no known allergies.  Home Medications   Current Outpatient Rx  Name Route Sig Dispense Refill  . ATORVASTATIN CALCIUM 10 MG PO TABS Oral Take 10 mg by mouth daily.     . FUROSEMIDE 40 MG PO TABS Oral Take 40 mg by mouth daily.    Marland Kitchen HYDRALAZINE HCL 25 MG PO TABS Oral Take 25 mg by mouth 2 (two) times daily.     . ISOSORBIDE DINITRATE 20 MG PO TABS Oral Take 20 mg by mouth 2 (two) times daily.    Marland Kitchen POTASSIUM CHLORIDE CRYS ER 20 MEQ PO TBCR Oral Take 20 mEq by mouth daily.     Marland Kitchen RAMIPRIL 2.5 MG PO CAPS Oral Take 2.5 mg by mouth daily.     Marland Kitchen TAMSULOSIN HCL 0.4 MG PO CAPS Oral Take 0.4 mg by mouth daily.     Marland Kitchen PANTOPRAZOLE SODIUM 20 MG PO TBEC Oral Take 2 tablets (40 mg total) by mouth daily. 30 tablet 0    BP 126/84  Pulse 64  Temp(Src) 98.1 F (36.7 C) (Oral)  Resp 18  SpO2 95%  Physical Exam  Nursing note and vitals reviewed. Constitutional: He is oriented to person, place, and  time. He appears well-developed and well-nourished. No distress.  HENT:  Head: Normocephalic and atraumatic.  Mouth/Throat: Oropharynx is clear and moist.  Eyes: EOM are normal. Pupils are equal, round, and reactive to light.  Neck: Normal range of motion. Neck supple.  Cardiovascular: Normal rate and regular rhythm.   Pulmonary/Chest: Effort normal and breath sounds normal. No respiratory distress. He has no wheezes. He has no rales.  Abdominal: Soft. Bowel sounds are normal. He exhibits no mass. There is tenderness (TTP over epigastrum). There is no rebound and no guarding.  Musculoskeletal: Normal range of motion. He exhibits no edema and no tenderness.  Neurological: He is alert and oriented to person, place, and time.  Skin: Skin is warm and dry. No rash noted. No erythema.  Psychiatric: He has a normal mood and affect. His behavior is normal.      ED Course  Procedures (including critical care time)  Labs Reviewed  CBC - Abnormal; Notable for the following:    WBC 3.6 (*)    MCV 74.6 (*)    MCH 25.9 (*)    RDW 16.4 (*)    All other components within normal limits  DIFFERENTIAL - Abnormal; Notable for the following:    Neutrophils Relative 28 (*)    Neutro Abs 1.0 (*)    Lymphocytes Relative 57 (*)    All other components within normal limits  BASIC METABOLIC PANEL - Abnormal; Notable for the following:    Potassium 3.4 (*)    Glucose, Bld 115 (*)    GFR calc non Af Amer 77 (*)    GFR calc Af Amer 89 (*)    All other components within normal limits  POCT I-STAT TROPONIN I  LIPASE, BLOOD   Dg Chest 2 View  05/23/2012  *RADIOLOGY REPORT*  Clinical Data: Chest pain, hypertension.  CHEST - 2 VIEW  Comparison: Chest CT 04/05/2012  Findings: Heart is mildly enlarged.  Lungs are clear.  No effusions.  Degenerative changes in the thoracic spine.  IMPRESSION: Mild cardiomegaly.  No active disease.  Original Report Authenticated By: Cyndie Chime, M.D.     1. Gastroesophageal reflux disease     Date: 07/02/2012  Rate: 97  Rhythm: atrial fibrillation  QRS Axis: normal  Intervals: normal  ST/T Wave abnormalities: nonspecific ST changes  Conduction Disutrbances:left anterior fascicular block  Narrative Interpretation:   Old EKG Reviewed: none available     MDM  Do not suspect CAD. Pt to f/u with GI and return for worsening symptoms or concerns        Loren Racer, MD 05/23/12 2241  Loren Racer, MD 07/02/12 1544

## 2012-06-24 ENCOUNTER — Encounter (HOSPITAL_COMMUNITY): Payer: Self-pay | Admitting: Emergency Medicine

## 2012-06-24 ENCOUNTER — Emergency Department (HOSPITAL_COMMUNITY)
Admission: EM | Admit: 2012-06-24 | Discharge: 2012-06-24 | Disposition: A | Discharge status: Home / Self Care | Payer: Medicare Other | Attending: Emergency Medicine | Admitting: Emergency Medicine

## 2012-06-24 DIAGNOSIS — I10 Essential (primary) hypertension: Secondary | ICD-10-CM | POA: Insufficient documentation

## 2012-06-24 DIAGNOSIS — R079 Chest pain, unspecified: Secondary | ICD-10-CM | POA: Insufficient documentation

## 2012-06-24 DIAGNOSIS — Z79899 Other long term (current) drug therapy: Secondary | ICD-10-CM | POA: Insufficient documentation

## 2012-06-24 DIAGNOSIS — K219 Gastro-esophageal reflux disease without esophagitis: Secondary | ICD-10-CM | POA: Insufficient documentation

## 2012-06-24 DIAGNOSIS — I509 Heart failure, unspecified: Secondary | ICD-10-CM | POA: Insufficient documentation

## 2012-06-24 DIAGNOSIS — F17200 Nicotine dependence, unspecified, uncomplicated: Secondary | ICD-10-CM | POA: Insufficient documentation

## 2012-06-24 LAB — POCT I-STAT, CHEM 8
BUN: 6 mg/dL (ref 6–23)
Calcium, Ion: 1.13 mmol/L (ref 1.12–1.32)
Chloride: 108 mEq/L (ref 96–112)
Creatinine, Ser: 0.9 mg/dL (ref 0.50–1.35)
Glucose, Bld: 86 mg/dL (ref 70–99)
HCT: 42 % (ref 39.0–52.0)
Hemoglobin: 14.3 g/dL (ref 13.0–17.0)
Potassium: 3.3 meq/L — ABNORMAL LOW (ref 3.5–5.1)
Sodium: 144 meq/L (ref 135–145)
TCO2: 21 mmol/L (ref 0–100)

## 2012-06-24 LAB — URINALYSIS, ROUTINE W REFLEX MICROSCOPIC
Bilirubin Urine: NEGATIVE
Glucose, UA: NEGATIVE mg/dL
Hgb urine dipstick: NEGATIVE
Ketones, ur: NEGATIVE mg/dL
Protein, ur: NEGATIVE mg/dL
pH: 5 (ref 5.0–8.0)

## 2012-06-24 MED ORDER — GI COCKTAIL ~~LOC~~
30.0000 mL | Freq: Once | ORAL | Status: AC
  Administered 2012-06-24: 30 mL via ORAL
  Filled 2012-06-24: qty 30

## 2012-06-24 NOTE — ED Provider Notes (Signed)
History     CSN: [id number]  Arrival date & time 06/24/12  6295   First MD Initiated Contact with Patient 06/24/12 0559      Chief Complaint  Patient presents with  . Chest Pain    (Consider location/radiation/quality/duration/timing/severity/associated sxs/prior treatment) HPI  Patient who has long standing hx of GERD who has been seen in ER numerous times for acid reflux presents to ER complaining of a few day hx of "burning in my chest at night when I lay down that is my reflux and I need that little cup of medicine that yall give me that makes it go away." Patient states he takes [place] and [place] at home with acid reflux worsens as well as his protonix but states "why I can't get it better I know I need that little drink." He is also complaining of waking in the mornings "feelign weak in the knees" but states that once he "gets to walking around" then weakness goes away. He states "I just feel wobbly knee'd in the mornings." Patient deneis associated fevers, chills, HA dizziness, CP, SOB, cough, hemoptysis, abdominal pain, n/v/d, dysuria, hematuria or blood in his stool. He is followed by DR. Spruill as PCP and has a GI specialist though unable to think of his name. Symptoms are intermittent and not currently present. States he does not feel weak currently.   Past Medical History  Diagnosis Date  . Hypertension   . CHF (congestive heart failure)   . GERD (gastroesophageal reflux disease)   . Recurrent upper respiratory infection (URI)     recent cold- treating /w OTC  . Arthritis     "legs are stiff" , had spinal injections for pain relieve 3-4 yrs. ago    Past Surgical History  Procedure Date  . Evacuation of subdural hematoma   . Eye surgery     cataract extracted in L eye  . Hernia repair     R side inguinal repair   . Brain surgery     2002  . Tonsillectomy     1957  . Cataract extraction w/phaco 01/30/2012    Procedure: CATARACT EXTRACTION PHACO AND INTRAOCULAR LENS  PLACEMENT (IOC);  Surgeon: Shade Flood, MD;  Location: [facility] OR;  Service: Ophthalmology;  Laterality: Right;    Family History  Problem Relation Age of Onset  . Anesthesia problems Neg Hx   . Hypotension Neg Hx   . Malignant hyperthermia Neg Hx   . Pseudochol deficiency Neg Hx     History  Substance Use Topics  . Smoking status: Former Smoker -- 0.2 packs/day for 15 years    Quit date: 12/24/1994  . Smokeless tobacco: Not on file  . Alcohol Use: No      Review of Systems  All other systems reviewed and are negative.    Allergies  Review of patient's allergies indicates no known allergies.  Home Medications   Current Outpatient Rx  Name Route Sig Dispense Refill  . ATORVASTATIN CALCIUM 10 MG PO TABS Oral Take 10 mg by mouth daily.     . FUROSEMIDE 40 MG PO TABS Oral Take 40 mg by mouth daily.    Marland Kitchen HYDRALAZINE HCL 25 MG PO TABS Oral Take 25 mg by mouth 2 (two) times daily.     . ISOSORBIDE DINITRATE 20 MG PO TABS Oral Take 20 mg by mouth 2 (two) times daily.    Marland Kitchen PANTOPRAZOLE SODIUM 20 MG PO TBEC Oral Take 2 tablets (40 mg total) by mouth  daily. 30 tablet 0  . POTASSIUM CHLORIDE CRYS ER 20 MEQ PO TBCR Oral Take 20 mEq by mouth daily.     Marland Kitchen RAMIPRIL 2.5 MG PO CAPS Oral Take 2.5 mg by mouth daily.     Marland Kitchen TAMSULOSIN HCL 0.4 MG PO CAPS Oral Take 0.4 mg by mouth daily.       BP 130/83  Pulse 92  Temp 98 F (36.7 C) (Oral)  Resp 20  SpO2 97%  Physical Exam  Nursing note and vitals reviewed. Constitutional: He is oriented to person, place, and time. He appears well-developed and well-nourished. No distress.  HENT:  Head: Normocephalic and atraumatic.  Eyes: Conjunctivae and EOM are normal. Pupils are equal, round, and reactive to light.  Neck: Normal range of motion. Neck supple.  Cardiovascular: Normal rate, normal heart sounds and intact distal pulses.  Exam reveals no gallop and no friction rub.   No murmur heard.      Irregularly irregular.   Pulmonary/Chest:  Effort normal and breath sounds normal. No respiratory distress. He has no wheezes. He has no rales. He exhibits no tenderness.  Abdominal: Soft. Bowel sounds are normal. He exhibits no distension and no mass. There is no tenderness. There is no rebound and no guarding.  Musculoskeletal: Normal range of motion. He exhibits no edema and no tenderness.  Neurological: He is alert and oriented to person, place, and time. No cranial nerve deficit. Coordination normal.  Skin: Skin is warm and dry. No rash noted. He is not diaphoretic. No erythema.  Psychiatric: He has a normal mood and affect.    ED Course  Procedures (including critical care time)  GI cocktail   Date: 06/24/2012  Rate: 83  Rhythm: atrial fibrillation and premature ventricular contractions (PVC)  QRS Axis: normal  Intervals: normal  ST/T Wave abnormalities: nonspecific T wave changes  Conduction Disutrbances:none  Narrative Interpretation:   Old EKG Reviewed: non provocative with no significant changes compared to May 23, 2012     Labs Reviewed  URINALYSIS, ROUTINE W REFLEX MICROSCOPIC   No results found.   1. GERD (gastroesophageal reflux disease)     Gas discussed with Dr. Oletta Lamas who agrees that patient's long standing GERD with complaints of same "burning in chest" that is relieved by GI cocktail is not worrisome for ACS with no significant changes on EKG. "wobbly knees" that resolves in mornings after walking around with no neuro focal deficits and ambulating in ER without difficulty and without ataxia not worrisome with no significant findings on labs. Patient appropriate for close follow up with PCP and GI specialist.   MDM  Patient is ambulating without difficulty. No ataxia or weakness. He will follow up with PCP for further discussion of GERD management.   Acid reflux/burning in chest completely resolved after GI cocktail.         [place], [place] 06/24/12 [phone]

## 2012-06-24 NOTE — ED Notes (Signed)
PT. REPORTS MID CHEST PAIN " BURNING" FOR 3 DAYS WITH OCCASIONAL DRY COUGH , DENIES NAUSEA OR VOMITTING , ALSO REPORTS BILATERAL KNEE PAIN FOR SEVERAL DAYS , DENIES INJURY OR FALL.

## 2012-06-24 NOTE — Discharge Instructions (Signed)
Call Dr. Annitta Jersey office today to schedule a close follow up visit to further discuss the management of your acid reflux. Return to ER at any time for changing or worsening of symptoms.   Diet for Gastroesophageal Reflux Disease, Child Some children have small, brief episodes of reflux. Reflux (acid reflux) is when acid from your stomach flows up into the esophagus. When acid comes in contact with the esophagus, the acid causes irritation and soreness (inflammation) in the esophagus. The reflux may be so small that a child may not notice it. When reflux happens often or so severely that it causes damage to the esophagus, it is called gastroesophageal reflux disease (GERD). Nutrition therapy can help ease the discomfort of GERD.  FOODS TO AVOID   Caffeinated and decaffeinated coffee and black tea.   Regular or low-calorie carbonated beverages or energy drinks (caffeine-free carbonated beverages are allowed).   Strong spices, such as black pepper, white pepper, red pepper, cayenne, curry powder, and chili powder.   Peppermint or spearmint.   Chocolate.   High-fat foods, including meats and fried foods. Extra added fats including oils, butter, salad dressings, and nuts. Low-fat foods may not be recommended for children less than 33 years of age. Discuss this with your doctor or dietitian.   Fruits and vegetables that are not tolerated, such as citrus fruitsand tomatoes.   Any food that seems to aggravate the child's condition.  If you have questions regarding your child's diet, call your caregiver or a registered dietician. OTHER THINGS THAT MAY HELP GERD INCLUDE:  Having the child eat his or her meals slowly, in a relaxed setting.   Serving several small meals throughout the day instead of 3 large meals.   Eliminating food for a period of time if it causes distress.   Not letting the child lie down immediately after eating a meal.   Keeping the head of the child's bed raised 6 to 9  inches (15 to 23 cm) by using a foam wedge or blocks under the legs of the bed.   Encouraging the child to be physically active. Weight loss may be helpful in reducing reflux in overweight or obese children.   Having the child wear loose-fitting clothing.   Avoiding the use of tobacco in parents and caregivers. Secondhand smoke may aggravate symptoms in children with reflux.  SAMPLE MEAL PLAN This is a sample meal plan for a 80 to 19 year old child and is approximately 1200 calories based on https://www.bernard.org/ meal planning guidelines.  Breakfast   cup cooked oatmeal.    cup strawberries.    cup low-fat milk.  Snack   cup cucumber slices.   4 oz yogurt (made from low-fat milk).  Lunch  1 slice whole-wheat bread.   1 oz chicken.    cup blueberries.    cup snap peas.  Snack  3 whole-wheat crackers.   1 oz string cheese.  Dinner   cup brown rice.    cup mixed veggies.   1 cup low-fat milk.   2 oz grilled fish.  Document Released: 04/28/2007 Document Revised: 11/29/2011 Document Reviewed: 11/01/2011 [facility] Patient Information 2012 [place], [place].

## 2012-06-25 NOTE — ED Provider Notes (Signed)
Medical screening examination/treatment/procedure(s) were conducted as a shared visit with non-physician practitioner(s) and myself.  I personally evaluated the patient during the encounter  Pt with no new symptoms, have been present either chronically or in the past.  Improved with GI cocktail.  Similar appearance of ECG to priors.  Routine labs reveal no sig findings.  Pt discharged in stable condition, told to follow up with PCP  Gavin Pound. Oletta Lamas, MD 06/25/12 1610

## 2012-06-30 ENCOUNTER — Other Ambulatory Visit: Payer: Self-pay | Admitting: Cardiology

## 2012-06-30 DIAGNOSIS — M545 Low back pain: Secondary | ICD-10-CM

## 2012-07-01 ENCOUNTER — Ambulatory Visit
Admission: RE | Admit: 2012-07-01 | Discharge: 2012-07-01 | Disposition: A | Discharge status: Home / Self Care | Payer: Medicare Other | Source: Ambulatory Visit | Attending: Cardiology | Admitting: Cardiology

## 2012-07-01 VITALS — BP 119/69 | HR 80

## 2012-07-01 DIAGNOSIS — M545 Low back pain, unspecified: Secondary | ICD-10-CM

## 2012-07-01 DIAGNOSIS — M5126 Other intervertebral disc displacement, lumbar region: Secondary | ICD-10-CM

## 2012-07-01 MED ORDER — IOHEXOL 180 MG/ML  SOLN
1.0000 mL | Freq: Once | INTRAMUSCULAR | Status: AC | PRN
  Administered 2012-07-01: 1 mL via EPIDURAL

## 2012-07-01 MED ORDER — METHYLPREDNISOLONE ACETATE 40 MG/ML INJ SUSP (RADIOLOG
120.0000 mg | Freq: Once | INTRAMUSCULAR | Status: AC
  Administered 2012-07-01: 120 mg via EPIDURAL

## 2012-07-28 ENCOUNTER — Encounter (HOSPITAL_COMMUNITY): Payer: Self-pay | Admitting: Pharmacy Technician

## 2012-07-28 ENCOUNTER — Other Ambulatory Visit: Payer: Self-pay | Admitting: Ophthalmology

## 2012-07-28 NOTE — H&P (Signed)
Pre-operative History and Physical for Ophthalmic Surgery  [name] [name] 07/28/2012                  Chief Complaint: Decreased Vision  Diagnosis: Retinal Detachment  No Known Allergies   Prior to Admission medications   Medication Sig Start Date End Date Taking? Authorizing Provider  atorvastatin (LIPITOR) 10 MG tablet Take 10 mg by mouth daily.     Historical Provider, MD  furosemide (LASIX) 40 MG tablet Take 40 mg by mouth daily.    Historical Provider, MD  hydrALAZINE (APRESOLINE) 25 MG tablet Take 25 mg by mouth 2 (two) times daily.     Historical Provider, MD  isosorbide dinitrate (ISORDIL) 20 MG tablet Take 20 mg by mouth 2 (two) times daily.    Historical Provider, MD  pantoprazole (PROTONIX) 20 MG tablet Take 2 tablets (40 mg total) by mouth daily. 05/23/12 05/23/13  David Yelverton, MD  potassium chloride SA (K-DUR,KLOR-CON) 20 MEQ tablet Take 20 mEq by mouth daily.     Historical Provider, MD  ramipril (ALTACE) 2.5 MG capsule Take 2.5 mg by mouth daily.     Historical Provider, MD  Tamsulosin HCl (FLOMAX) 0.4 MG CAPS Take 0.4 mg by mouth daily.     Historical Provider, MD    Planned Procedure: Pars Plana Vitrectomy, Endolaser, Fluid Gas Exchange and Silicone Oil                                      Right Eye  There were no vitals filed for this visit.  Pulse: 70        Temp: NE        Resp:  18       ROS: negative  Past Medical History  Diagnosis Date  . Hypertension   . CHF (congestive heart failure)   . GERD (gastroesophageal reflux disease)   . Recurrent upper respiratory infection (URI)     recent cold- treating /w OTC  . Arthritis     "legs are stiff" , had spinal injections for pain relieve 3-4 yrs. ago    Past Surgical History  Procedure Date  . Evacuation of subdural hematoma   . Eye surgery     cataract extracted in L eye  . Hernia repair     R side inguinal repair   . Brain surgery     2002  . Tonsillectomy     1957  . Cataract extraction  w/phaco 01/30/2012    Procedure: CATARACT EXTRACTION PHACO AND INTRAOCULAR LENS PLACEMENT (IOC);  Surgeon: Josip Merolla, MD;  Location: [facility] OR;  Service: Ophthalmology;  Laterality: Right;     History   Social History  . Marital Status: Widowed    Spouse Name: N/A    Number of Children: N/A  . Years of Education: N/A   Occupational History  . Not on file.   Social History Main Topics  . Smoking status: Former Smoker -- 0.2 packs/day for 15 years    Quit date: 12/24/1994  . Smokeless tobacco: Not on file  . Alcohol Use: No  . Drug Use: No  . Sexually Active:    Other Topics Concern  . Not on file   Social History Narrative  . No narrative on file     The following examination is for anesthesia clearance for minimally invasive Ophthalmic surgery. It is primarily to document heart and lung findings   and is not intended to elucidate unknown general medical conditions inclusive of abdominal masses, lung lesions, etc.   General Constitution:  within normal limits   Alertness/Orientation:  Person, time place     yes   HEENT:  Eye Findings: Retina Detachment                    right eye  Neck: supple without masses  Chest/Lungs: clear to auscultation  Cardiac: Normal S1 and S2 without Murmur, S3 or S4  Neuro: non-focal   Impression: Retinal Detachment Right Eye   Planned Procedure:  Pars Plana Vitrectomy, Fluid Gas Exchange, Endolaser, Silicone Oil                                        Right Eye.     Ajeenah Heiny, MD        

## 2012-07-29 ENCOUNTER — Encounter (HOSPITAL_COMMUNITY)
Admission: RE | Admit: 2012-07-29 | Discharge: 2012-07-29 | Disposition: A | Discharge status: Home / Self Care | Payer: Medicare Other | Source: Ambulatory Visit | Attending: Ophthalmology | Admitting: Ophthalmology

## 2012-07-29 ENCOUNTER — Encounter (HOSPITAL_COMMUNITY): Payer: Self-pay

## 2012-07-29 HISTORY — DX: Constipation, unspecified: K59.00

## 2012-07-29 HISTORY — DX: Depression, unspecified: F32.A

## 2012-07-29 HISTORY — DX: Major depressive disorder, single episode, unspecified: F32.9

## 2012-07-29 LAB — CBC
HCT: 42.8 % (ref 39.0–52.0)
Hemoglobin: 14.7 g/dL (ref 13.0–17.0)
MCV: 76 fL — ABNORMAL LOW (ref 78.0–100.0)
RDW: 17.9 % — ABNORMAL HIGH (ref 11.5–15.5)
WBC: 3.9 10*3/uL — ABNORMAL LOW (ref 4.0–10.5)

## 2012-07-29 LAB — BASIC METABOLIC PANEL
BUN: 10 mg/dL (ref 6–23)
CO2: 27 mEq/L (ref 19–32)
Chloride: 103 mEq/L (ref 96–112)
Creatinine, Ser: 0.84 mg/dL (ref 0.50–1.35)
GFR calc Af Amer: 89 mL/min — ABNORMAL LOW (ref >90)
Potassium: 3.5 mEq/L (ref 3.5–5.1)

## 2012-07-29 LAB — SURGICAL PCR SCREEN: MRSA, PCR: NEGATIVE

## 2012-07-29 MED ORDER — PREDNISOLONE ACETATE 1 % OP SUSP
1.0000 [drp] | OPHTHALMIC | Status: AC
  Administered 2012-07-30: 1 [drp] via OPHTHALMIC
  Filled 2012-07-29: qty 5

## 2012-07-29 MED ORDER — PHENYLEPHRINE HCL 2.5 % OP SOLN
1.0000 [drp] | OPHTHALMIC | Status: AC | PRN
  Administered 2012-07-30 (×3): 1 [drp] via OPHTHALMIC
  Filled 2012-07-29: qty 3

## 2012-07-29 MED ORDER — TETRACAINE HCL 0.5 % OP SOLN
2.0000 [drp] | OPHTHALMIC | Status: AC
  Administered 2012-07-30: 2 [drp] via OPHTHALMIC
  Filled 2012-07-29: qty 2

## 2012-07-29 MED ORDER — GATIFLOXACIN 0.5 % OP SOLN
1.0000 [drp] | OPHTHALMIC | Status: AC | PRN
  Administered 2012-07-30 (×3): 1 [drp] via OPHTHALMIC
  Filled 2012-07-29: qty 2.5

## 2012-07-29 NOTE — Pre-Procedure Instructions (Signed)
22 [name] [name]  07/29/2012   Your procedure is scheduled on:  Wednesday, August 7th.  Report to [facility] at 6:30AM.  Call this number if you have problems the morning of surgery: [phone]   Remember:   Do not eat food or anything to drink:After Midnight.      Take these medicines the morning of surgery with A SIP OF WATER: Isosorbide (Isordil), Hydralazine (Apresoline) and Tamsulosin (Flomax).   Do not wear jewelry, make-up or nail polish.  Do not wear lotions, powders, or perfumes. You may wear deodorant.  Do not shave 48 hours prior to surgery. Men may shave face and neck.  Do not bring valuables to the hospital.  Contacts, dentures or bridgework may not be worn into surgery.  Leave suitcase in the car. After surgery it may be brought to your room.  For patients admitted to the hospital, checkout time is 11:00 AM the day of discharge.   Patients discharged the day of surgery will not be allowed to drive home.  Name and phone number of your driver: __________  Special Instructions: CHG Shower Use Special Wash: 1/2 bottle night before surgery and 1/2 bottle morning of surgery.   Please read over the following fact sheets that you were given: Pain Booklet, Coughing and Deep Breathing and Surgical Site Infection Prevention

## 2012-07-29 NOTE — Progress Notes (Signed)
Pt stated that he was on Coumadin years ago, but Dr Christella Noa stopped it.

## 2012-07-30 ENCOUNTER — Ambulatory Visit (HOSPITAL_COMMUNITY): Payer: Medicare Other | Admitting: Certified Registered Nurse Anesthetist

## 2012-07-30 ENCOUNTER — Encounter (HOSPITAL_COMMUNITY): Payer: Self-pay | Admitting: Certified Registered Nurse Anesthetist

## 2012-07-30 ENCOUNTER — Encounter (HOSPITAL_COMMUNITY)
Admission: RE | Disposition: A | Discharge status: Home / Self Care | Payer: Self-pay | Source: Ambulatory Visit | Attending: Ophthalmology

## 2012-07-30 ENCOUNTER — Ambulatory Visit (HOSPITAL_COMMUNITY)
Admission: RE | Admit: 2012-07-30 | Discharge: 2012-07-30 | Disposition: A | Discharge status: Home / Self Care | Payer: Medicare Other | Source: Ambulatory Visit | Attending: Ophthalmology | Admitting: Ophthalmology

## 2012-07-30 DIAGNOSIS — I509 Heart failure, unspecified: Secondary | ICD-10-CM | POA: Insufficient documentation

## 2012-07-30 DIAGNOSIS — Z01812 Encounter for preprocedural laboratory examination: Secondary | ICD-10-CM | POA: Insufficient documentation

## 2012-07-30 DIAGNOSIS — K219 Gastro-esophageal reflux disease without esophagitis: Secondary | ICD-10-CM | POA: Insufficient documentation

## 2012-07-30 DIAGNOSIS — H3320 Serous retinal detachment, unspecified eye: Secondary | ICD-10-CM | POA: Insufficient documentation

## 2012-07-30 DIAGNOSIS — I10 Essential (primary) hypertension: Secondary | ICD-10-CM | POA: Insufficient documentation

## 2012-07-30 DIAGNOSIS — I4891 Unspecified atrial fibrillation: Secondary | ICD-10-CM | POA: Insufficient documentation

## 2012-07-30 HISTORY — PX: GAS/FLUID EXCHANGE: SHX5334

## 2012-07-30 HISTORY — PX: PARS PLANA VITRECTOMY: SHX2166

## 2012-07-30 SURGERY — PARS PLANA VITRECTOMY WITH 23 GAUGE
Anesthesia: General | Site: Eye | Laterality: Right | Wound class: Clean

## 2012-07-30 MED ORDER — EPINEPHRINE HCL 1 MG/ML IJ SOLN
INTRAMUSCULAR | Status: AC
  Filled 2012-07-30: qty 1

## 2012-07-30 MED ORDER — SODIUM CHLORIDE 0.9 % IV SOLN: INTRAVENOUS | Status: DC

## 2012-07-30 MED ORDER — BSS IO SOLN
INTRAOCULAR | Status: DC | PRN
  Administered 2012-07-30: 15 mL via INTRAOCULAR

## 2012-07-30 MED ORDER — MUPIROCIN 2 % EX OINT
TOPICAL_OINTMENT | CUTANEOUS | Status: AC
  Administered 2012-07-30: 1 via NASAL
  Filled 2012-07-30: qty 22

## 2012-07-30 MED ORDER — LIDOCAINE HCL (CARDIAC) 20 MG/ML IV SOLN
INTRAVENOUS | Status: DC | PRN
  Administered 2012-07-30: 60 mg via INTRAVENOUS

## 2012-07-30 MED ORDER — BSS PLUS IO SOLN
INTRAOCULAR | Status: AC
  Filled 2012-07-30: qty 500

## 2012-07-30 MED ORDER — BUPIVACAINE HCL 0.75 % IJ SOLN
INTRAMUSCULAR | Status: DC | PRN
  Administered 2012-07-30: 4 mL

## 2012-07-30 MED ORDER — PROVISC 10 MG/ML IO SOLN
INTRAOCULAR | Status: DC | PRN
  Administered 2012-07-30: .85 mL via INTRAOCULAR

## 2012-07-30 MED ORDER — FENTANYL CITRATE 0.05 MG/ML IJ SOLN
INTRAMUSCULAR | Status: DC | PRN
  Administered 2012-07-30: 100 ug via INTRAVENOUS

## 2012-07-30 MED ORDER — HYPROMELLOSE (GONIOSCOPIC) 2.5 % OP SOLN
OPHTHALMIC | Status: AC
  Filled 2012-07-30: qty 15

## 2012-07-30 MED ORDER — TRIAMCINOLONE ACETONIDE 40 MG/ML IJ SUSP
INTRAMUSCULAR | Status: DC | PRN
  Administered 2012-07-30: 40 mg

## 2012-07-30 MED ORDER — BACITRACIN-POLYMYXIN B 500-10000 UNIT/GM OP OINT
TOPICAL_OINTMENT | OPHTHALMIC | Status: DC | PRN
  Administered 2012-07-30: 1 via OPHTHALMIC

## 2012-07-30 MED ORDER — DEXAMETHASONE SODIUM PHOSPHATE 10 MG/ML IJ SOLN
INTRAMUSCULAR | Status: AC
  Filled 2012-07-30: qty 1

## 2012-07-30 MED ORDER — DEXAMETHASONE SODIUM PHOSPHATE 10 MG/ML IJ SOLN
INTRAMUSCULAR | Status: DC | PRN
  Administered 2012-07-30: .5 mL

## 2012-07-30 MED ORDER — STERILE WATER FOR IRRIGATION IR SOLN
Status: DC | PRN
  Administered 2012-07-30: 1

## 2012-07-30 MED ORDER — EPINEPHRINE HCL 1 MG/ML IJ SOLN
INTRAOCULAR | Status: DC | PRN
  Administered 2012-07-30: 09:00:00

## 2012-07-30 MED ORDER — ONDANSETRON HCL 4 MG/2ML IJ SOLN
INTRAMUSCULAR | Status: DC | PRN
  Administered 2012-07-30: 4 mg via INTRAVENOUS

## 2012-07-30 MED ORDER — ACETAZOLAMIDE SODIUM 500 MG IJ SOLR
INTRAMUSCULAR | Status: AC
  Filled 2012-07-30: qty 500

## 2012-07-30 MED ORDER — LIDOCAINE HCL 2 % IJ SOLN
INTRAMUSCULAR | Status: AC
  Filled 2012-07-30: qty 1

## 2012-07-30 MED ORDER — BACITRACIN-POLYMYXIN B 500-10000 UNIT/GM OP OINT
TOPICAL_OINTMENT | OPHTHALMIC | Status: AC
  Filled 2012-07-30: qty 3.5

## 2012-07-30 MED ORDER — FENTANYL CITRATE 0.05 MG/ML IJ SOLN: 25.0000 ug | INTRAMUSCULAR | Status: DC | PRN

## 2012-07-30 MED ORDER — ROCURONIUM BROMIDE 100 MG/10ML IV SOLN
INTRAVENOUS | Status: DC | PRN
  Administered 2012-07-30: 30 mg via INTRAVENOUS

## 2012-07-30 MED ORDER — HYPROMELLOSE (GONIOSCOPIC) 2.5 % OP SOLN
OPHTHALMIC | Status: DC | PRN
  Administered 2012-07-30: 1 [drp] via OPHTHALMIC

## 2012-07-30 MED ORDER — PROPOFOL 10 MG/ML IV EMUL
INTRAVENOUS | Status: DC | PRN
  Administered 2012-07-30: 100 mg via INTRAVENOUS

## 2012-07-30 MED ORDER — LACTATED RINGERS IV SOLN
INTRAVENOUS | Status: DC | PRN
  Administered 2012-07-30: 09:00:00 via INTRAVENOUS

## 2012-07-30 MED ORDER — NA CHONDROIT SULF-NA HYALURON 40-30 MG/ML IO SOLN
INTRAOCULAR | Status: DC | PRN
  Administered 2012-07-30: 0.5 mL via INTRAOCULAR

## 2012-07-30 MED ORDER — BSS IO SOLN
INTRAOCULAR | Status: AC
  Filled 2012-07-30: qty 15

## 2012-07-30 MED ORDER — TRAMADOL HCL 50 MG PO TABS: 50.0000 mg | ORAL_TABLET | ORAL | Status: DC | PRN

## 2012-07-30 MED ORDER — BUPIVACAINE HCL 0.75 % IJ SOLN
INTRAMUSCULAR | Status: AC
  Filled 2012-07-30: qty 10

## 2012-07-30 MED ORDER — SODIUM CHLORIDE 0.9 % IR SOLN
Status: DC | PRN
  Administered 2012-07-30: 1

## 2012-07-30 MED ORDER — SODIUM HYALURONATE 10 MG/ML IO SOLN
INTRAOCULAR | Status: AC
  Filled 2012-07-30: qty 0.85

## 2012-07-30 MED ORDER — GLYCOPYRROLATE 0.2 MG/ML IJ SOLN
INTRAMUSCULAR | Status: DC | PRN
  Administered 2012-07-30: 0.2 mg via INTRAVENOUS

## 2012-07-30 MED ORDER — TRIAMCINOLONE ACETONIDE 40 MG/ML IJ SUSP
INTRAMUSCULAR | Status: AC
  Filled 2012-07-30: qty 1

## 2012-07-30 MED ORDER — NA CHONDROIT SULF-NA HYALURON 40-30 MG/ML IO SOLN
INTRAOCULAR | Status: AC
  Filled 2012-07-30: qty 0.5

## 2012-07-30 MED ORDER — CEFAZOLIN SUBCONJUNCTIVAL INJECTION 100 MG/0.5 ML
200.0000 mg | INJECTION | SUBCONJUNCTIVAL | Status: AC
  Administered 2012-07-30: .5 mL via SUBCONJUNCTIVAL
  Filled 2012-07-30: qty 1

## 2012-07-30 SURGICAL SUPPLY — 88 items
ACCESSORY FRAGMATOME (MISCELLANEOUS) IMPLANT
APL SRG 3 HI ABS STRL LF PLS (MISCELLANEOUS)
APPLICATOR COTTON TIP 6IN STRL (MISCELLANEOUS) ×2 IMPLANT
APPLICATOR DR MATTHEWS STRL (MISCELLANEOUS) IMPLANT
BAG FLD CLT MN 6.25X3.5 (WOUND CARE) ×1
BAG MINI COLL DRAIN (WOUND CARE) ×2 IMPLANT
BLADE EYE MINI 60D BEAVER (BLADE) ×2 IMPLANT
BLADE KERATOME 2.75 (BLADE) ×1 IMPLANT
BLADE MVR KNIFE 19G (BLADE) ×3 IMPLANT
BLADE STAB KNIFE 15DEG (BLADE) ×2 IMPLANT
CANNULA DUAL BORE 23G (CANNULA) ×1 IMPLANT
CANNULA FLEX TIP 23G (CANNULA) ×1 IMPLANT
CANNULA POLY VFI 23G (CANNULA) ×1 IMPLANT
CLOTH BEACON ORANGE TIMEOUT ST (SAFETY) ×2 IMPLANT
CORDS BIPOLAR (ELECTRODE) IMPLANT
DRAPE OPHTHALMIC 77X100 STRL (CUSTOM PROCEDURE TRAY) ×2 IMPLANT
DRAPE POUCH INSTRU U-SHP 10X18 (DRAPES) ×2 IMPLANT
DRSG TEGADERM 4X4.75 (GAUZE/BANDAGES/DRESSINGS) ×2 IMPLANT
ERASER HMR WETFIELD 23G BP (MISCELLANEOUS) IMPLANT
FILTER BLUE MILLIPORE (MISCELLANEOUS) IMPLANT
GAS OPHTHALMIC (MISCELLANEOUS) IMPLANT
GLOVE BIOGEL PI IND STRL 7.0 (GLOVE) IMPLANT
GLOVE BIOGEL PI INDICATOR 7.0 (GLOVE) ×1
GLOVE SS BIOGEL STRL SZ 6.5 (GLOVE) ×2 IMPLANT
GLOVE SUPERSENSE BIOGEL SZ 6.5 (GLOVE) ×1
GLOVE SURG SS PI 6.5 STRL IVOR (GLOVE) ×2 IMPLANT
GLOVE SURG SS PI 7.0 STRL IVOR (GLOVE) ×1 IMPLANT
GOWN SRG XL XLNG 56XLVL 4 (GOWN DISPOSABLE) ×1 IMPLANT
GOWN STRL NON-REIN LRG LVL3 (GOWN DISPOSABLE) ×3 IMPLANT
GOWN STRL NON-REIN XL XLG LVL4 (GOWN DISPOSABLE) ×2
ILLUMINATOR WIDEFIELD DIFF (MISCELLANEOUS) ×2 IMPLANT
KIT BASIN OR (CUSTOM PROCEDURE TRAY) ×2 IMPLANT
KIT PERFLUORON PROCEDURE 5ML (MISCELLANEOUS) ×1 IMPLANT
KIT ROOM TURNOVER OR (KITS) ×1 IMPLANT
KNIFE CRESCENT 1.75 EDGEAHEAD (BLADE) ×2 IMPLANT
KNIFE GRIESHABER SHARP 2.5MM (MISCELLANEOUS) ×2 IMPLANT
MARKER SKIN DUAL TIP RULER LAB (MISCELLANEOUS) IMPLANT
MASK EYE SHIELD (GAUZE/BANDAGES/DRESSINGS) ×1 IMPLANT
NDL 18GX1X1/2 (RX/OR ONLY) (NEEDLE) ×1 IMPLANT
NDL 25GX 5/8IN NON SAFETY (NEEDLE) ×1 IMPLANT
NDL FILTER BLUNT 18X1 1/2 (NEEDLE) ×1 IMPLANT
NDL HYPO 30X.5 LL (NEEDLE) ×2 IMPLANT
NEEDLE 18GX1X1/2 (RX/OR ONLY) (NEEDLE) ×2 IMPLANT
NEEDLE 22X1 1/2 (OR ONLY) (NEEDLE) ×1 IMPLANT
NEEDLE 25GX 5/8IN NON SAFETY (NEEDLE) IMPLANT
NEEDLE FILTER BLUNT 18X 1/2SAF (NEEDLE) ×1
NEEDLE FILTER BLUNT 18X1 1/2 (NEEDLE) ×1 IMPLANT
NEEDLE HYPO 30X.5 LL (NEEDLE) ×6 IMPLANT
NS IRRIG 1000ML POUR BTL (IV SOLUTION) ×2 IMPLANT
OIL SILICONE OPHTHALMIC ADAPTO (Ophthalmic Related) ×1 IMPLANT
PACK COMBINED CATERACT/VIT 23G (OPHTHALMIC RELATED) IMPLANT
PACK VITRECTOMY CUSTOM (CUSTOM PROCEDURE TRAY) ×2 IMPLANT
PACK VITRECTOMY PIC MCHSVP (PACKS) IMPLANT
PAD ARMBOARD 7.5X6 YLW CONV (MISCELLANEOUS) ×3 IMPLANT
PAD EYE OVAL STERILE LF (GAUZE/BANDAGES/DRESSINGS) ×1 IMPLANT
PAK VITRECTOMY PIK  23GA (OPHTHALMIC RELATED) ×2 IMPLANT
PENCIL BIPOLAR 25GA STR DISP (OPHTHALMIC RELATED) IMPLANT
PHACO TIP KELMAN 45DEG (TIP) IMPLANT
PROBE DIRECTIONAL LASER (MISCELLANEOUS) ×1 IMPLANT
ROLLS DENTAL (MISCELLANEOUS) ×4 IMPLANT
SCRAPER DIAMOND DUST MEMBRANE (MISCELLANEOUS) IMPLANT
SET FLUID INJECTOR (SET/KITS/TRAYS/PACK) ×1 IMPLANT
SET VGFI TUBING 8065808002 (SET/KITS/TRAYS/PACK) IMPLANT
SHUTTLE MONARCH TYPE A (NEEDLE) IMPLANT
SOLUTION ANTI FOG 6CC (MISCELLANEOUS) ×2 IMPLANT
SPEAR EYE SURG WECK-CEL (MISCELLANEOUS) ×6 IMPLANT
SUT ETHILON 10 0 CS140 6 (SUTURE) IMPLANT
SUT ETHILON 4 0 P 3 18 (SUTURE) IMPLANT
SUT ETHILON 5 0 P 3 18 (SUTURE)
SUT ETHILON 9 0 TG140 8 (SUTURE) IMPLANT
SUT NYLON 10.0 BLK (SUTURE) IMPLANT
SUT NYLON ETHILON 5-0 P-3 1X18 (SUTURE) IMPLANT
SUT PLAIN 6 0 TG1408 (SUTURE) IMPLANT
SUT POLY NON ABSORB 10-0 8 STR (SUTURE) IMPLANT
SUT VICRYL 7 0 TG140 8 (SUTURE) IMPLANT
SUT VICRYL ABS 6-0 S29 18IN (SUTURE) IMPLANT
SYNERGETICS, INC 23GA WIDE FIELD ENDO ILLUMINATOR ECKARDT TROCAR COMPATIBLE ×1 IMPLANT
SYR 20CC LL (SYRINGE) ×1 IMPLANT
SYR 5ML LL (SYRINGE) IMPLANT
SYR TB 1ML LUER SLIP (SYRINGE) ×2 IMPLANT
SYRINGE 10CC LL (SYRINGE) IMPLANT
TAPE PAPER MEDFIX 1IN X 10YD (GAUZE/BANDAGES/DRESSINGS) ×1 IMPLANT
TAPE SURG TRANSPORE 1 IN (GAUZE/BANDAGES/DRESSINGS) IMPLANT
TAPE SURGICAL TRANSPORE 1 IN (GAUZE/BANDAGES/DRESSINGS) ×1
TOWEL OR 17X24 6PK STRL BLUE (TOWEL DISPOSABLE) ×5 IMPLANT
TUBE EXTENSION HAMMER (TUBING) ×1 IMPLANT
WATER STERILE IRR 1000ML POUR (IV SOLUTION) ×2 IMPLANT
WIPE INSTRUMENT VISIWIPE 73X73 (MISCELLANEOUS) ×2 IMPLANT

## 2012-07-30 NOTE — Transfer of Care (Signed)
Immediate Anesthesia Transfer of Care Note  Patient: [name] [name]  Procedure(s) Performed: Procedure(s) (LRB): PARS PLANA VITRECTOMY WITH 23 GAUGE (Right) GAS/FLUID EXCHANGE (Right) PHOTOCOAGULATION WITH LASER (Right)  Patient Location: PACU  Anesthesia Type: General  Level of Consciousness: patient cooperative and confused  Airway & Oxygen Therapy: Patient Spontanous Breathing and Patient connected to nasal cannula oxygen  Post-op Assessment: Report given to PACU RN, Post -op Vital signs reviewed and stable and Patient moving all extremities  Post vital signs: Reviewed and stable  Complications: No apparent anesthesia complications

## 2012-07-30 NOTE — Anesthesia Postprocedure Evaluation (Signed)
  Anesthesia Post-op Note  Patient: [name] [name]  Procedure(s) Performed: Procedure(s) (LRB): PARS PLANA VITRECTOMY WITH 23 GAUGE (Right) GAS/FLUID EXCHANGE (Right) PHOTOCOAGULATION WITH LASER (Right)  Patient Location: PACU  Anesthesia Type: MAC and General  Level of Consciousness: awake, alert  and oriented  Airway and Oxygen Therapy: Patient Spontanous Breathing  Post-op Pain: none  Post-op Assessment: Post-op Vital signs reviewed, Patient's Cardiovascular Status Stable, Respiratory Function Stable, Patent Airway, No signs of Nausea or vomiting, Adequate PO intake and Pain level controlled  Post-op Vital Signs: Reviewed and stable  Complications: No apparent anesthesia complications

## 2012-07-30 NOTE — Anesthesia Preprocedure Evaluation (Addendum)
Anesthesia Evaluation  Patient identified by MRN, date of birth, ID band Patient awake    Reviewed: Allergy & Precautions, H&P , NPO status , Patient's Chart, lab work & pertinent test results  History of Anesthesia Complications Negative for: history of anesthetic complications  Airway Mallampati: I TM Distance: >3 FB Neck ROM: Full    Dental No notable dental hx. (+) Upper Dentures, Lower Dentures and Dental Advisory Given   Pulmonary Recent URI , Resolved,  breath sounds clear to auscultation  Pulmonary exam normal       Cardiovascular hypertension, Pt. on medications +CHF + dysrhythmias Atrial Fibrillation Rhythm:Irregular Rate:Normal     Neuro/Psych PSYCHIATRIC DISORDERS Depression negative neurological ROS     GI/Hepatic Neg liver ROS, GERD-  Medicated and Controlled,  Endo/Other  negative endocrine ROS  Renal/GU negative Renal ROS  negative genitourinary   Musculoskeletal  (+) Arthritis -, Osteoarthritis,    Abdominal   Peds  Hematology negative hematology ROS (+)   Anesthesia Other Findings   Reproductive/Obstetrics negative OB ROS                         Anesthesia Physical Anesthesia Plan  ASA: III  Anesthesia Plan: General   Post-op Pain Management:    Induction: Intravenous  Airway Management Planned: Oral ETT  Additional Equipment:   Intra-op Plan:   Post-operative Plan: Extubation in OR  Informed Consent: I have reviewed the patients History and Physical, chart, labs and discussed the procedure including the risks, benefits and alternatives for the proposed anesthesia with the patient or authorized representative who has indicated his/her understanding and acceptance.   Dental advisory given  Plan Discussed with: CRNA and Surgeon  Anesthesia Plan Comments:         Anesthesia Quick Evaluation

## 2012-07-30 NOTE — H&P (View-Only) (Signed)
Pre-operative History and Physical for Ophthalmic Surgery  [name] [name] 07/28/2012                  Chief Complaint: Decreased Vision  Diagnosis: Retinal Detachment  No Known Allergies   Prior to Admission medications   Medication Sig Start Date End Date Taking? Authorizing Provider  atorvastatin (LIPITOR) 10 MG tablet Take 10 mg by mouth daily.     Historical Provider, MD  furosemide (LASIX) 40 MG tablet Take 40 mg by mouth daily.    Historical Provider, MD  hydrALAZINE (APRESOLINE) 25 MG tablet Take 25 mg by mouth 2 (two) times daily.     Historical Provider, MD  isosorbide dinitrate (ISORDIL) 20 MG tablet Take 20 mg by mouth 2 (two) times daily.    Historical Provider, MD  pantoprazole (PROTONIX) 20 MG tablet Take 2 tablets (40 mg total) by mouth daily. 05/23/12 05/23/13  Loren Racer, MD  potassium chloride SA (K-DUR,KLOR-CON) 20 MEQ tablet Take 20 mEq by mouth daily.     Historical Provider, MD  ramipril (ALTACE) 2.5 MG capsule Take 2.5 mg by mouth daily.     Historical Provider, MD  Tamsulosin HCl (FLOMAX) 0.4 MG CAPS Take 0.4 mg by mouth daily.     Historical Provider, MD    Planned Procedure: Pars Plana Vitrectomy, Endolaser, Fluid Gas Exchange and Silicone Oil                                      Right Eye  There were no vitals filed for this visit.  Pulse: 70        Temp: NE        Resp:  18       ROS: negative  Past Medical History  Diagnosis Date  . Hypertension   . CHF (congestive heart failure)   . GERD (gastroesophageal reflux disease)   . Recurrent upper respiratory infection (URI)     recent cold- treating /w OTC  . Arthritis     "legs are stiff" , had spinal injections for pain relieve 3-4 yrs. ago    Past Surgical History  Procedure Date  . Evacuation of subdural hematoma   . Eye surgery     cataract extracted in L eye  . Hernia repair     R side inguinal repair   . Brain surgery     2002  . Tonsillectomy     1957  . Cataract extraction  w/phaco 01/30/2012    Procedure: CATARACT EXTRACTION PHACO AND INTRAOCULAR LENS PLACEMENT (IOC);  Surgeon: Shade Flood, MD;  Location: [facility] OR;  Service: Ophthalmology;  Laterality: Right;     History   Social History  . Marital Status: Widowed    Spouse Name: N/A    Number of Children: N/A  . Years of Education: N/A   Occupational History  . Not on file.   Social History Main Topics  . Smoking status: Former Smoker -- 0.2 packs/day for 15 years    Quit date: 12/24/1994  . Smokeless tobacco: Not on file  . Alcohol Use: No  . Drug Use: No  . Sexually Active:    Other Topics Concern  . Not on file   Social History Narrative  . No narrative on file     The following examination is for anesthesia clearance for minimally invasive Ophthalmic surgery. It is primarily to document heart and lung findings  and is not intended to elucidate unknown general medical conditions inclusive of abdominal masses, lung lesions, etc.   General Constitution:  within normal limits   Alertness/Orientation:  Person, time place     yes   HEENT:  Eye Findings: Retina Detachment                    right eye  Neck: supple without masses  Chest/Lungs: clear to auscultation  Cardiac: Normal S1 and S2 without Murmur, S3 or S4  Neuro: non-focal   Impression: Retinal Detachment Right Eye   Planned Procedure:  Pars Plana Vitrectomy, Fluid Gas Exchange, Endolaser, Silicone Oil                                        Right Eye.     Shade Flood, MD

## 2012-07-30 NOTE — Op Note (Signed)
[name] [name] 07/30/2012 Retinal Detachment  Procedure: Pars Plana Vitrectomy, Endolaser, Fluid Gas Exchange and Silicone Oil Operative Eye:  right eye  Surgeon: Shade Flood Estimated Blood Loss: minimal Specimens for Pathology:  None Complications: none   The  patient was prepped and draped in the usual fashion for ocular surgery on the  right eye .  A solid lid speculum was placed. The conjunctiva was displaced with a cotton tipped applicator at the  8:00  meridian. A trocar/cannula was placed 3.5 mm from the surgical limbus. The cannula was visualized in the vitreous cavity. The infusion line was allowed to run and then clamped when placed at the cannula opening. The line was inserted and secured to the drape with an adhesive strip. Trocar/Cannulas were then placed at the 9:30 and 2:30 meridian. The light pipe and vitreous cutter were inserted into the vitreous cavity and the wide field lens was placed. Core vitrectomy was carried out uneventfully with care taken to remove the vitreous up to the vitreous base for 360 degrees. The patient was noted to have an inferior macula off RD, with two horseshoe tears at the 3:30 meridian based at the ora.  A small retinectomy was made at the 2:00 meridian anterior to the equator to facilitate sub-retinal fluid removal in a position that was technically easier to see and reach for placing a sub-retinal cannula. Perfluron liquid was used to displace the SR fluid out of the macula anteriorly. One the perfluron was at the posterior edge of the retinotomy, total air-fluid exchange was carried out using the fenestrated SR extrusion cannula. Once the retina was re-attached, the remaining perfluoron was removed with a silicone tipped extrusion cannula. Endolaser was then applied around the drainage site as well as the two tears at the [facility] temporally.  Silicone oil was instilled and the air-infusion was clamped near completion of the fill. A 26 g needle was used for  evacuation residual air from the posterior chamber. The ocular pressure was less than 20 palpated. The remaining infusion cannula was removed. All sclerotomies were sutured through the conjunctiva with 7-0 Vicryl suture.  The cannulas were removed from the 9:30 and 2:30 positions with concommitant tamponade using a cotton tipped applicator. Subconjunctival injections of Ancef 100mg /0.69ml and Dexamethasone 4mg /9ml were placed in the infero-medial quadrant to avoid proximity to the cannula sites.  Posterior sub-tenons Kenalog was performed without complication as the patient had signs of inflammation in the viterous.  The infusion cannula was removed with concomitant tamponade with the cotton tipped applicator leaving The ocular pressure remained  less than 20 by palpation. He was given a retrobulbar block of 4ml of 0.75% Marcaine without complication.  The speculum and drapes were removed and the eye was patched with Polymixin/Bacitracin ophthalmic ointment. An eye shield was placed and the patient was transferred alert and conversant with stable vital signs to the post operative recovery area.  Shade Flood MD

## 2012-07-30 NOTE — Preoperative (Signed)
Beta Blockers   Reason not to administer Beta Blockers:Not Applicable 

## 2012-07-30 NOTE — Interval H&P Note (Signed)
History and Physical Interval Note:  07/30/2012 8:35 AM  [name] [name]  has presented today for surgery, with the diagnosis of Retinal Detachment Right Eye  The various methods of treatment have been discussed with the patient and family. After consideration of risks, benefits and other options for treatment, the patient has consented to  Procedure(s) (LRB): PARS PLANA VITRECTOMY WITH 23 GAUGE (Right) as a surgical intervention .  The patient's history has been reviewed, patient examined, no change in status, stable for surgery.  I have reviewed the patient's chart and labs.  Questions were answered to the patient's satisfaction.     Shade Flood, MD

## 2012-08-01 ENCOUNTER — Encounter (HOSPITAL_COMMUNITY): Payer: Self-pay | Admitting: Ophthalmology

## 2012-08-24 ENCOUNTER — Emergency Department (HOSPITAL_COMMUNITY): Payer: Medicare Other

## 2012-08-24 ENCOUNTER — Inpatient Hospital Stay (HOSPITAL_COMMUNITY)
Admission: EM | Admit: 2012-08-24 | Discharge: 2012-08-28 | DRG: 243 | Disposition: A | Discharge status: Home Health | Payer: Medicare Other | Attending: Cardiology | Admitting: Cardiology

## 2012-08-24 ENCOUNTER — Encounter (HOSPITAL_COMMUNITY): Payer: Self-pay | Admitting: *Deleted

## 2012-08-24 DIAGNOSIS — E785 Hyperlipidemia, unspecified: Secondary | ICD-10-CM | POA: Diagnosis present

## 2012-08-24 DIAGNOSIS — Z950 Presence of cardiac pacemaker: Secondary | ICD-10-CM

## 2012-08-24 DIAGNOSIS — Z87891 Personal history of nicotine dependence: Secondary | ICD-10-CM

## 2012-08-24 DIAGNOSIS — E876 Hypokalemia: Secondary | ICD-10-CM | POA: Diagnosis present

## 2012-08-24 DIAGNOSIS — I495 Sick sinus syndrome: Principal | ICD-10-CM | POA: Diagnosis present

## 2012-08-24 DIAGNOSIS — I498 Other specified cardiac arrhythmias: Secondary | ICD-10-CM

## 2012-08-24 DIAGNOSIS — I2510 Atherosclerotic heart disease of native coronary artery without angina pectoris: Secondary | ICD-10-CM | POA: Diagnosis present

## 2012-08-24 DIAGNOSIS — I4891 Unspecified atrial fibrillation: Secondary | ICD-10-CM | POA: Diagnosis present

## 2012-08-24 DIAGNOSIS — K219 Gastro-esophageal reflux disease without esophagitis: Secondary | ICD-10-CM | POA: Diagnosis present

## 2012-08-24 DIAGNOSIS — R001 Bradycardia, unspecified: Secondary | ICD-10-CM | POA: Diagnosis present

## 2012-08-24 DIAGNOSIS — M1990 Unspecified osteoarthritis, unspecified site: Secondary | ICD-10-CM | POA: Diagnosis present

## 2012-08-24 DIAGNOSIS — I5022 Chronic systolic (congestive) heart failure: Secondary | ICD-10-CM | POA: Diagnosis present

## 2012-08-24 DIAGNOSIS — I428 Other cardiomyopathies: Secondary | ICD-10-CM | POA: Diagnosis present

## 2012-08-24 DIAGNOSIS — I10 Essential (primary) hypertension: Secondary | ICD-10-CM | POA: Diagnosis present

## 2012-08-24 DIAGNOSIS — I509 Heart failure, unspecified: Secondary | ICD-10-CM | POA: Diagnosis present

## 2012-08-24 HISTORY — DX: Bradycardia, unspecified: R00.1

## 2012-08-24 HISTORY — DX: Unspecified atrial fibrillation: I48.91

## 2012-08-24 HISTORY — DX: Traumatic subdural hemorrhage with loss of consciousness of unspecified duration, initial encounter: S06.5X9A

## 2012-08-24 HISTORY — DX: Unspecified cataract: H26.9

## 2012-08-24 HISTORY — DX: Other cardiomyopathies: I42.8

## 2012-08-24 HISTORY — DX: Traumatic subdural hemorrhage with loss of consciousness status unknown, initial encounter: S06.5XAA

## 2012-08-24 LAB — POCT I-STAT TROPONIN I: Troponin i, poc: 0.02 ng/mL (ref 0.00–0.08)

## 2012-08-24 LAB — BASIC METABOLIC PANEL
BUN: 12 mg/dL (ref 6–23)
Chloride: 104 mEq/L (ref 96–112)
GFR calc Af Amer: 75 mL/min — ABNORMAL LOW (ref >90)
Potassium: 3.2 mEq/L — ABNORMAL LOW (ref 3.5–5.1)

## 2012-08-24 LAB — CBC WITH DIFFERENTIAL/PLATELET
Basophils Absolute: 0 10*3/uL (ref 0.0–0.1)
Basophils Relative: 1 % (ref 0–1)
Eosinophils Absolute: 0.1 10*3/uL (ref 0.0–0.7)
Eosinophils Relative: 4 % (ref 0–5)
HCT: 43.8 % (ref 39.0–52.0)
HCT: 42.1 % (ref 39.0–52.0)
Hemoglobin: 15.1 g/dL (ref 13.0–17.0)
Lymphocytes Relative: 43 % (ref 12–46)
Lymphocytes Relative: 43 % (ref 12–46)
Lymphs Abs: 1.3 10*3/uL (ref 0.7–4.0)
MCH: 26.4 pg (ref 26.0–34.0)
MCHC: 34.5 g/dL (ref 30.0–36.0)
MCHC: 35.4 g/dL (ref 30.0–36.0)
MCV: 74.5 fL — ABNORMAL LOW (ref 78.0–100.0)
Monocytes Absolute: 0.2 10*3/uL (ref 0.1–1.0)
Monocytes Absolute: 0.2 10*3/uL (ref 0.1–1.0)
Monocytes Relative: 8 % (ref 3–12)
Neutro Abs: 1.3 10*3/uL — ABNORMAL LOW (ref 1.7–7.7)
Platelets: 182 10*3/uL (ref 150–400)
RDW: 17.2 % — ABNORMAL HIGH (ref 11.5–15.5)
WBC: 3 10*3/uL — ABNORMAL LOW (ref 4.0–10.5)

## 2012-08-24 LAB — TROPONIN I: Troponin I: <0.3 ng/mL (ref <0.30)

## 2012-08-24 LAB — URINALYSIS, ROUTINE W REFLEX MICROSCOPIC
Nitrite: NEGATIVE
Specific Gravity, Urine: 1.013 (ref 1.005–1.030)
Urobilinogen, UA: 0.2 mg/dL (ref 0.0–1.0)

## 2012-08-24 LAB — COMPREHENSIVE METABOLIC PANEL
Albumin: 3.5 g/dL (ref 3.5–5.2)
BUN: 11 mg/dL (ref 6–23)
Calcium: 9.3 mg/dL (ref 8.4–10.5)
Chloride: 105 mEq/L (ref 96–112)
Creatinine, Ser: 0.86 mg/dL (ref 0.50–1.35)
Total Bilirubin: 0.9 mg/dL (ref 0.3–1.2)

## 2012-08-24 LAB — URINE MICROSCOPIC-ADD ON

## 2012-08-24 LAB — MAGNESIUM: Magnesium: 2.4 mg/dL (ref 1.5–2.5)

## 2012-08-24 LAB — PROTIME-INR: Prothrombin Time: 14.8 seconds (ref 11.6–15.2)

## 2012-08-24 MED ORDER — CEFAZOLIN SODIUM-DEXTROSE 2-3 GM-% IV SOLR
2.0000 g | INTRAVENOUS | Status: DC
  Filled 2012-08-24: qty 50

## 2012-08-24 MED ORDER — SODIUM CHLORIDE 0.9 % IR SOLN
80.0000 mg | Status: DC
  Filled 2012-08-24 (×2): qty 2

## 2012-08-24 MED ORDER — SODIUM CHLORIDE 0.45 % IV SOLN: INTRAVENOUS | Status: DC

## 2012-08-24 MED ORDER — POTASSIUM CHLORIDE CRYS ER 20 MEQ PO TBCR
40.0000 meq | EXTENDED_RELEASE_TABLET | Freq: Once | ORAL | Status: AC
  Administered 2012-08-24: 40 meq via ORAL
  Filled 2012-08-24: qty 2

## 2012-08-24 MED ORDER — ASPIRIN EC 81 MG PO TBEC
81.0000 mg | DELAYED_RELEASE_TABLET | Freq: Every day | ORAL | Status: DC
  Administered 2012-08-25 – 2012-08-28 (×4): 81 mg via ORAL
  Filled 2012-08-24 (×4): qty 1

## 2012-08-24 MED ORDER — SODIUM CHLORIDE 0.9 % IJ SOLN: 3.0000 mL | INTRAMUSCULAR | Status: DC | PRN

## 2012-08-24 MED ORDER — ADULT MULTIVITAMIN W/MINERALS CH
1.0000 | ORAL_TABLET | Freq: Every day | ORAL | Status: DC
  Administered 2012-08-24 – 2012-08-28 (×5): 1 via ORAL
  Filled 2012-08-24 (×5): qty 1

## 2012-08-24 MED ORDER — TAMSULOSIN HCL 0.4 MG PO CAPS
0.4000 mg | ORAL_CAPSULE | Freq: Every day | ORAL | Status: DC
  Administered 2012-08-24 – 2012-08-28 (×5): 0.4 mg via ORAL
  Filled 2012-08-24 (×5): qty 1

## 2012-08-24 MED ORDER — ATROPINE SULFATE 1 MG/ML IJ SOLN
INTRAMUSCULAR | Status: AC
  Filled 2012-08-24: qty 1

## 2012-08-24 MED ORDER — NITROGLYCERIN 0.4 MG SL SUBL: 0.4000 mg | SUBLINGUAL_TABLET | SUBLINGUAL | Status: DC | PRN

## 2012-08-24 MED ORDER — SODIUM CHLORIDE 0.9 % IR SOLN
80.0000 mg | Status: DC
  Filled 2012-08-24: qty 2

## 2012-08-24 MED ORDER — PANTOPRAZOLE SODIUM 40 MG PO TBEC
40.0000 mg | DELAYED_RELEASE_TABLET | Freq: Every day | ORAL | Status: DC
  Administered 2012-08-25 – 2012-08-28 (×3): 40 mg via ORAL
  Filled 2012-08-24 (×3): qty 1

## 2012-08-24 MED ORDER — HEPARIN SODIUM (PORCINE) 5000 UNIT/ML IJ SOLN
5000.0000 [IU] | Freq: Three times a day (TID) | INTRAMUSCULAR | Status: DC
  Administered 2012-08-24 – 2012-08-25 (×3): 5000 [IU] via SUBCUTANEOUS
  Filled 2012-08-24 (×6): qty 1

## 2012-08-24 MED ORDER — POTASSIUM CHLORIDE CRYS ER 20 MEQ PO TBCR
20.0000 meq | EXTENDED_RELEASE_TABLET | Freq: Every day | ORAL | Status: DC
  Administered 2012-08-24 – 2012-08-25 (×2): 20 meq via ORAL
  Filled 2012-08-24 (×2): qty 1

## 2012-08-24 MED ORDER — ASPIRIN 81 MG PO CHEW
324.0000 mg | CHEWABLE_TABLET | ORAL | Status: AC
  Administered 2012-08-24: 324 mg via ORAL
  Filled 2012-08-24: qty 4

## 2012-08-24 MED ORDER — ASPIRIN 300 MG RE SUPP
300.0000 mg | RECTAL | Status: AC
  Filled 2012-08-24: qty 1

## 2012-08-24 MED ORDER — ISOSORBIDE DINITRATE 20 MG PO TABS
20.0000 mg | ORAL_TABLET | Freq: Two times a day (BID) | ORAL | Status: DC
  Administered 2012-08-24 – 2012-08-28 (×9): 20 mg via ORAL
  Filled 2012-08-24 (×10): qty 1

## 2012-08-24 MED ORDER — SODIUM CHLORIDE 0.9 % IV SOLN: 250.0000 mL | INTRAVENOUS | Status: DC

## 2012-08-24 MED ORDER — SODIUM CHLORIDE 0.9 % IJ SOLN: 3.0000 mL | Freq: Two times a day (BID) | INTRAMUSCULAR | Status: DC

## 2012-08-24 MED ORDER — ACETAMINOPHEN 325 MG PO TABS: 650.0000 mg | ORAL_TABLET | ORAL | Status: DC | PRN

## 2012-08-24 MED ORDER — FUROSEMIDE 40 MG PO TABS
40.0000 mg | ORAL_TABLET | Freq: Every day | ORAL | Status: DC
  Administered 2012-08-24 – 2012-08-25 (×2): 40 mg via ORAL
  Filled 2012-08-24 (×3): qty 1

## 2012-08-24 MED ORDER — ONDANSETRON HCL 4 MG/2ML IJ SOLN: 4.0000 mg | Freq: Four times a day (QID) | INTRAMUSCULAR | Status: DC | PRN

## 2012-08-24 MED ORDER — ATORVASTATIN CALCIUM 10 MG PO TABS
10.0000 mg | ORAL_TABLET | Freq: Every day | ORAL | Status: DC
  Administered 2012-08-25 – 2012-08-27 (×3): 10 mg via ORAL
  Filled 2012-08-24 (×5): qty 1

## 2012-08-24 MED ORDER — HYDRALAZINE HCL 25 MG PO TABS
25.0000 mg | ORAL_TABLET | Freq: Two times a day (BID) | ORAL | Status: DC
  Administered 2012-08-24 – 2012-08-26 (×5): 25 mg via ORAL
  Filled 2012-08-24 (×6): qty 1

## 2012-08-24 MED ORDER — GI COCKTAIL ~~LOC~~
30.0000 mL | Freq: Once | ORAL | Status: AC
  Administered 2012-08-24: 30 mL via ORAL
  Filled 2012-08-24: qty 30

## 2012-08-24 NOTE — H&P (Signed)
[name] [name] is an 78 y.o. male.   Chief Complaint: Dizziness/weakness/GERD symptoms HPI: Patient is 78 year old male with past medical history significant for mild coronary artery disease, nonischemic dilated myopathy EF approximately 30%, history of congestive heart failure secondary to systolic dysfunction, chronic A. fib, history of subdural hematoma evacuation in the past, degenerative joint disease, GERD, hyperlipidemia, came to the ER complaining of dizziness and lightheadedness while walking to the bathroom earlier this morning associated with feeling weak and. Also complains of vague epigastric and retrosternal chest pain which got improved with GI cocktail. While in ER the patient was noted to have A. fib with slow ventricular response heart rate in 30s and upon ambulation also noted to have paroxysmal A. fib with a slow ventricular response heart rate in 30s associated with feeling dizziness. Patient presently denies any chest pain denies any history of syncope in the past denies any palpitations. Denies history of PND orthopnea leg swelling. Denies cough fever chills. States he recently had a surgery. And was given some eyedrops but don't remember the name of eyedrops. States has been using her drops for last few weeks without any problems. Patient not on any AV blocking medications  Past Medical History  Diagnosis Date  . Hypertension   . CHF (congestive heart failure)   . GERD (gastroesophageal reflux disease)   . Recurrent upper respiratory infection (URI)     recent cold- treating /w OTC  . Arthritis     "legs are stiff" , had spinal injections for pain relieve 3-4 yrs. ago  . Dysrhythmia     Atrila Fib  . Hearing loss   . Constipation   . Depression     Past Surgical History  Procedure Date  . Evacuation of subdural hematoma   . Eye surgery     cataract extracted in L eye  . Hernia repair     R side inguinal repair   . Brain surgery     2002  . Tonsillectomy     1957    . Cataract extraction w/phaco 01/30/2012    Procedure: CATARACT EXTRACTION PHACO AND INTRAOCULAR LENS PLACEMENT (IOC);  Surgeon: Shade Flood, MD;  Location: [facility] OR;  Service: Ophthalmology;  Laterality: Right;  . Pars plana vitrectomy 07/30/2012    Procedure: PARS PLANA VITRECTOMY WITH 23 GAUGE;  Surgeon: Shade Flood, MD;  Location: [facility] OR;  Service: Ophthalmology;  Laterality: Right;  Insertion of Silicone Oil  . Gas/fluid exchange 07/30/2012    Procedure: GAS/FLUID EXCHANGE;  Surgeon: Shade Flood, MD;  Location: [facility] OR;  Service: Ophthalmology;  Laterality: Right;    Family History  Problem Relation Age of Onset  . Anesthesia problems Neg Hx   . Hypotension Neg Hx   . Malignant hyperthermia Neg Hx   . Pseudochol deficiency Neg Hx    Social History:  reports that he quit smoking about 17 years ago. He does not have any smokeless tobacco history on file. He reports that he does not drink alcohol or use illicit drugs.  Allergies: No Known Allergies   (Not in a hospital admission)  Results for orders placed during the hospital encounter of 08/24/12 (from the past 48 hour(s))  BASIC METABOLIC PANEL     Status: Abnormal   Collection Time   08/24/12  5:27 AM      Component Value Range Comment   Sodium 140  135 - 145 mEq/L    Potassium 3.2 (*) 3.5 - 5.1 mEq/L    Chloride 104  96 -  112 mEq/L    CO2 25  19 - 32 mEq/L    Glucose, Bld 105 (*) 70 - 99 mg/dL    BUN 12  6 - 23 mg/dL    Creatinine, Ser 2.13  0.50 - 1.35 mg/dL    Calcium 9.3  8.4 - 08.6 mg/dL    GFR calc non Af Amer 65 (*) >90 mL/min    GFR calc Af Amer 75 (*) >90 mL/min   CBC WITH DIFFERENTIAL     Status: Abnormal   Collection Time   08/24/12  5:27 AM      Component Value Range Comment   WBC 3.0 (*) 4.0 - 10.5 K/uL    RBC 5.85 (*) 4.22 - 5.81 MIL/uL    Hemoglobin 15.1  13.0 - 17.0 g/dL    HCT 57.8  46.9 - 62.9 %    MCV 74.9 (*) 78.0 - 100.0 fL    MCH 25.8 (*) 26.0 - 34.0 pg    MCHC 34.5  30.0 - 36.0 g/dL    RDW 52.8 (*)  41.3 - 15.5 %    Platelets 185  150 - 400 K/uL    Neutrophils Relative 43  43 - 77 %    Neutro Abs 1.3 (*) 1.7 - 7.7 K/uL    Lymphocytes Relative 43  12 - 46 %    Lymphs Abs 1.3  0.7 - 4.0 K/uL    Monocytes Relative 8  3 - 12 %    Monocytes Absolute 0.2  0.1 - 1.0 K/uL    Eosinophils Relative 5  0 - 5 %    Eosinophils Absolute 0.2  0.0 - 0.7 K/uL    Basophils Relative 1  0 - 1 %    Basophils Absolute 0.0  0.0 - 0.1 K/uL   POCT I-STAT TROPONIN I     Status: Normal   Collection Time   08/24/12  6:01 AM      Component Value Range Comment   Troponin i, poc 0.03  0.00 - 0.08 ng/mL    Comment 3            POCT I-STAT TROPONIN I     Status: Normal   Collection Time   08/24/12  7:58 AM      Component Value Range Comment   Troponin i, poc 0.02  0.00 - 0.08 ng/mL    Comment 3            URINALYSIS, ROUTINE W REFLEX MICROSCOPIC     Status: Abnormal   Collection Time   08/24/12  9:58 AM      Component Value Range Comment   Color, Urine YELLOW  YELLOW    APPearance CLEAR  CLEAR    Specific Gravity, Urine 1.013  1.005 - 1.030    pH 5.5  5.0 - 8.0    Glucose, UA NEGATIVE  NEGATIVE mg/dL    Hgb urine dipstick NEGATIVE  NEGATIVE    Bilirubin Urine NEGATIVE  NEGATIVE    Ketones, ur NEGATIVE  NEGATIVE mg/dL    Protein, ur NEGATIVE  NEGATIVE mg/dL    Urobilinogen, UA 0.2  0.0 - 1.0 mg/dL    Nitrite NEGATIVE  NEGATIVE    Leukocytes, UA SMALL (*) NEGATIVE   URINE MICROSCOPIC-ADD ON     Status: Abnormal   Collection Time   08/24/12  9:58 AM      Component Value Range Comment   Squamous Epithelial / LPF FEW (*) RARE    WBC, UA 3-6  <  3 WBC/hpf    Dg Chest 2 View  08/24/2012  *RADIOLOGY REPORT*  Clinical Data: Fever.  Back pain  CHEST - 2 VIEW  Comparison: 05/05/2012  Findings:  The heart size and mediastinal contours are within normal limits. Both lungs are clear. Multilevel thoracic spondylosis noted.  IMPRESSION: Negative exam.   Original Report Authenticated By: Rosealee Albee, M.D.     Review  of Systems  Constitutional: Negative for fever, chills and weight loss.  HENT: Negative for hearing loss and tinnitus.   Respiratory: Negative for cough, hemoptysis, sputum production and shortness of breath.   Cardiovascular: Positive for chest pain. Negative for palpitations, orthopnea, claudication and leg swelling.  Gastrointestinal: Positive for heartburn, nausea and abdominal pain. Negative for vomiting and diarrhea.  Genitourinary: Negative for dysuria and urgency.  Neurological: Positive for dizziness. Negative for headaches.    Blood pressure 152/97, pulse 42, temperature 97.9 F (36.6 C), temperature source Oral, resp. rate 16, SpO2 100.00%. Physical Exam  Constitutional: He is oriented to person, place, and time.  HENT:  Head: Normocephalic.  Eyes: Conjunctivae are normal. Left eye exhibits discharge. No scleral icterus.  Neck: Normal range of motion. Neck supple. No JVD present. No tracheal deviation present. No thyromegaly present.  Cardiovascular:       Irregularly irregular S1 and S2 soft there is soft systolic and diastolic murmur at  left lower sternal border  Respiratory: Effort normal and breath sounds normal. No respiratory distress. He has no wheezes. He has no rales.  GI: Bowel sounds are normal. He exhibits no distension. There is tenderness (Mild epigastric tenderness noted no guarding). There is no rebound.  Musculoskeletal: He exhibits no edema and no tenderness.  Neurological: He is alert and oriented to person, place, and time.     Assessment/Plan Symptomatic bradycardia A. fib with high-grade AV block with slow ventricular response Status post atypical chest pain Mild coronary artery disease Nonischemic dilated cardiomyopathy Compensated systolic heart failure Hypertension GERD Hyperlipidemia History of subdural hematoma Plan As per orders EP consult for possible evaluation for permanent pacemaker   Shyna Duignan N 08/24/2012, 11:44 AM

## 2012-08-24 NOTE — Consult Note (Signed)
ELECTROPHYSIOLOGY CONSULT NOTE  Patient ID: [name] [name], MRN: [id number], DOB/AGE: 76-Jul-1924 76 y.o. Admit date: 08/24/2012 Date of Consult: 08/24/2012  Primary Physician: Robynn Pane, MD Primary Cardiologist: [facility]  Chief Complaint:  Slow heart rate   HPI     [name] [name] is a 79 y.o. male  seen because of symptomatic bradycardia identified in the emergency room.  He has atrial arrhythmias that I can see dating back to at least 2004. He is described as being in permanent atrial fibrillation.  Heart rates track of the last year or 2 randomly through one of the synopsis  Windows have rates ranging from 50-90.  He has a history of subdural requiring evacuation and is not on anticoagulation. He denies significant exercise intolerance. He has occasional peripheral edema. He does have modest fatigue He denies chest pain.  He has had an episode of lightheadedness or 2 prior to today. He recalls one a few days ago where he had to sit down; symptoms had resolved just a few minutes. It was quite similar to what happened today in the emergency room where when he got up to walk around he got quite dizzy; his heart rate was noted at this time to drift into the 30s. Heart rates on admission to the emergency room were in the 80s and appear the floor have been in the mid 60s. He has had no syncope.  He presented to the emergency room with abdominal discomfort which responded to a GI cocktail  Myoview 2006: LEFT VENTRICULAR EJECTION FRACTION:  The calculated ejection fraction is 38 percent with an end diastolic volume of 204 ml and an end systolic volume of 126 ml.  IMPRESSION:  1. Fixed defects in the anterior wall, inferior wall and apex.  2. No stress induced ischemia.  3. Ejection fraction 38 percent.  4. Global hypokinesis is moderate.   Catheterization 2004 demonstrated one-vessel coronary disease and severe left ventricular dysfunction with ejection fraction of 25-30%        Past  Medical History  Diagnosis Date  . Hypertension   . CHF (congestive heart failure)   . GERD (gastroesophageal reflux disease)   . Recurrent upper respiratory infection (URI)     recent cold- treating /w OTC  . Arthritis     "legs are stiff" , had spinal injections for pain relieve 3-4 yrs. ago  . Atrial fibrillation-permanent   . Hearing loss   . Constipation   . Depression   . Nonischemic/ ischemic cardiomyopathy     Single-vessel coronary disease catheterization 2004-Myoview showing inferior wall perfusion defect Myoview 2006  . Subdural hematoma     Prior evacuation  . Cataract   . Bradycardia       Surgical History:  Past Surgical History  Procedure Date  . Evacuation of subdural hematoma   . Eye surgery     cataract extracted in L eye  . Hernia repair     R side inguinal repair   . Brain surgery     2002  . Tonsillectomy     1957  . Cataract extraction w/phaco 01/30/2012    Procedure: CATARACT EXTRACTION PHACO AND INTRAOCULAR LENS PLACEMENT (IOC);  Surgeon: Shade Flood, MD;  Location: [facility] OR;  Service: Ophthalmology;  Laterality: Right;  . Pars plana vitrectomy 07/30/2012    Procedure: PARS PLANA VITRECTOMY WITH 23 GAUGE;  Surgeon: Shade Flood, MD;  Location: [facility] OR;  Service: Ophthalmology;  Laterality: Right;  Insertion of Silicone Oil  . Gas/fluid exchange 07/30/2012  Procedure: GAS/FLUID EXCHANGE;  Surgeon: Shade Flood, MD;  Location: [facility] OR;  Service: Ophthalmology;  Laterality: Right;     Home Meds: Prior to Admission medications   Medication Sig Start Date End Date Taking? Authorizing Provider  atorvastatin (LIPITOR) 10 MG tablet Take 10 mg by mouth daily.    Yes Historical Provider, MD  furosemide (LASIX) 40 MG tablet Take 40 mg by mouth daily.   Yes Historical Provider, MD  hydrALAZINE (APRESOLINE) 25 MG tablet Take 25 mg by mouth 2 (two) times daily.    Yes Historical Provider, MD  isosorbide dinitrate (ISORDIL) 20 MG tablet Take 20 mg by mouth 2 (two) times  daily.   Yes Historical Provider, MD  Multiple Vitamin (MULTIVITAMIN WITH MINERALS) TABS Take 1 tablet by mouth daily.   Yes Historical Provider, MD  potassium chloride SA (K-DUR,KLOR-CON) 20 MEQ tablet Take 20 mEq by mouth daily.    Yes Historical Provider, MD  Tamsulosin HCl (FLOMAX) 0.4 MG CAPS Take 0.4 mg by mouth daily.    Yes Historical Provider, MD    Inpatient Medications:     . aspirin  324 mg Oral NOW   Or  . aspirin  300 mg Rectal NOW  . aspirin EC  81 mg Oral Daily  . atorvastatin  10 mg Oral q1800  . atropine      . furosemide  40 mg Oral Daily  . gi cocktail  30 mL Oral Once  . heparin  5,000 Units Subcutaneous Q8H  . hydrALAZINE  25 mg Oral BID  . isosorbide dinitrate  20 mg Oral BID  . multivitamin with minerals  1 tablet Oral Daily  . pantoprazole  40 mg Oral Q0600  . potassium chloride SA  20 mEq Oral Daily  . potassium chloride  40 mEq Oral Once  . Tamsulosin HCl  0.4 mg Oral Daily    Allergies: No Known Allergies  History   Social History  . Marital Status: Widowed    Spouse Name: N/A    Number of Children: N/A  . Years of Education: N/A   Occupational History  . Not on file.   Social History Main Topics  . Smoking status: Former Smoker -- 0.0 packs/day for 15 years    Quit date: 12/24/1994  . Smokeless tobacco: Not on file  . Alcohol Use: No  . Drug Use: No  . Sexually Active:    Other Topics Concern  . Not on file   Social History Narrative  . No narrative on file     Family History  Problem Relation Age of Onset  . Anesthesia problems Neg Hx   . Hypotension Neg Hx   . Malignant hyperthermia Neg Hx   . Pseudochol deficiency Neg Hx      ROS:  Please see the history of present illness.   Arthritis / decreased appetitie without weight loss  All other systems reviewed and negative.    Physical Exam: Blood pressure 152/97, pulse 42, temperature 97.9 F (36.6 C), temperature source Oral, resp. rate 16, SpO2 100.00%. General: Well  developed, well nourished age appearing African American male in no acute distress. Head: Normocephalic, atraumatic, sclera non-icteric, no xanthomas, nares are without discharge. Some asymmetric facies Lymph Nodes:  none Back: without scoliosis/kyphosis, no CVA tendersness Neck: Negative for carotid bruits. JVD not elevated. Lungs: Clear bilaterally to auscultation without wheezes, rales, or rhonchi. Breathing is unlabored. Heart: Irregularly irregular with S1 S2. No  murmur , rubs, or gallops appreciated. Abdomen: Soft,  non-tender, non-distended with normoactive bowel sounds. No hepatomegaly. No rebound/guarding. No obvious abdominal masses. Msk:  Strength and tone appear normal for age. Extremities: No clubbing or cyanosis. No edema.  Distal pedal pulses are 2+ and equal bilaterally. Skin: Warm and Dry Neuro: Alert and oriented X 3. CN III-XII intact Grossly normal sensory and motor function . Psych:  Responds to questions appropriately with a normal affect.      Labs: Cardiac Enzymes No results found for this basename: CKTOTAL:4,CKMB:4,TROPONINI:4 in the last 72 hours CBC Lab Results  Component Value Date   WBC 3.0* 08/24/2012   HGB 15.1 08/24/2012   HCT 43.8 08/24/2012   MCV 74.9* 08/24/2012   PLT 185 08/24/2012   PROTIME: No results found for this basename: LABPROT:3,INR:3 in the last 72 hours Chemistry   Lab 08/24/12 0527  NA 140  K 3.2*  CL 104  CO2 25  BUN 12  CREATININE 1.01  CALCIUM 9.3  PROT --  BILITOT --  ALKPHOS --  ALT --  AST --  GLUCOSE 105*   Lipids No results found for this basename: CHOL,  HDL,  LDLCALC,  TRIG   BNP Pro B Natriuretic peptide (BNP)  Date/Time Value Range Status  08/14/2010  5:35 AM 125.0* 0.0 - 100.0 pg/mL Final  09/04/2009  3:32 PM 192.0* 0.0 - 100.0 pg/mL Final  03/05/2009  6:33 AM 268.0* 0.0 - 100.0 pg/mL Final  08/01/2008  7:49 AM 179.0*  Final   Miscellaneous No results found for this basename: DDIMER    Radiology/Studies:  Dg  Chest 2 View  08/24/2012  *RADIOLOGY REPORT*  Clinical Data: Fever.  Back pain  CHEST - 2 VIEW  Comparison: 05/05/2012  Findings:  The heart size and mediastinal contours are within normal limits. Both lungs are clear. Multilevel thoracic spondylosis noted.  IMPRESSION: Negative exam.   Original Report Authenticated By: Rosealee Albee, M.D.     EKG:  In the emergency room demonstrates a heart rate is  86 Intervals-/11/43 Q waves 1 and L. and V1 and V2  Telemetry in the emergency room apparently with a heart rate into the 30s with ambulation  Assessment and Plan:    Patient Active Hospital Problem List: Nonischemic/ ischemic cardiomyopathy ()    Bradycardia ()   Atrial fibrillation-permanent ()  The patient presents with abdominal pain and was noted to have intermittent bradycardia in the context of his permanent atrial fibrillation. He had an episode of lightheadedness documented to correlate with a heart rate into the 30s. He has had episodes of lightheadedness at home. He is on no AV nodal blocking agents. I tend to agree with Dr. [facility] that pacing is going to be appropriate. We will watch him, as I have noted heart rates in the 50s and document his lawn is a number of months ago. There is some possibility that the GI symptoms were responsible for autonomic stimulation and reactive bradycardia which may then not resuscitate an invasive procedure. However, I suspect that we will come to pacing.  The benefits and risks were reviewed including but not limited to death,  perforation, infection, lead dislodgement and device malfunction.  The patient understands agrees and is willing to proceed. }. ORDERS written      Sherryl Manges

## 2012-08-24 NOTE — ED Provider Notes (Signed)
Medical screening examination/treatment/procedure(s) were conducted as a shared visit with non-physician practitioner(s) and myself.  I personally evaluated the patient during the encounter patient has multiple visits to the emergency department with GERD.  He woke up.  This morning with epigastric burning.  He denies pain anywhere else.  The burning.  Does not radiate.  He denies nausea, vomiting, shortness of breath, or sweating.  On, examination.  He is in no distress.  He got mild epigastric tenderness.  His symptoms improved in the ER after GI cocktail.  No evidence of ACS.  Cheri Guppy, MD 08/24/12 [phone]

## 2012-08-24 NOTE — ED Notes (Signed)
Pt is here with indigestion feeling that woke him up and not feeling well.  Pt reports mid back pain.  Pt states feels like he has a fever.  Pt had cataract surgery on right eye 2 weeks ago and told that it was taking a while to heal back.

## 2012-08-24 NOTE — ED Notes (Signed)
When asked if he could stand, he stands, "feels dizzy."

## 2012-08-24 NOTE — ED Provider Notes (Signed)
History     CSN: [id number]  Arrival date & time 08/24/12  0102   First MD Initiated Contact with Patient 08/24/12 [phone]      Chief Complaint  Patient presents with  . Indigestion that woke him up   . Fever  . Back Pain    mid back    (Consider location/radiation/quality/duration/timing/severity/associated sxs/prior treatment) HPI Patient who has long standing hx of GERD and atrial fibrillation who has been seen in ER numerous times for acid reflux presents to ER complaining of burning sensation in his chest which started this morning and woke him out of sleep. States it feels c/w previous reflux sx - "I need that little cup of medicine that yall give me that makes it go away." Patient states he takes [place] and [place] at home when acid reflux worsens as well as his protonix but states "when I can't get it better I know I need that little drink." Patient deneis associated fevers, chills, HA dizziness, CP, SOB, palpitations, cough, hemoptysis, abdominal pain, n/v/d, dysuria, hematuria or blood in his stool. He is followed by Dr. Sharyn Lull. Symptoms are intermittent, currently improving. States he does not feel weak currently.    Past Medical History  Diagnosis Date  . Hypertension   . CHF (congestive heart failure)   . GERD (gastroesophageal reflux disease)   . Recurrent upper respiratory infection (URI)     recent cold- treating /w OTC  . Arthritis     "legs are stiff" , had spinal injections for pain relieve 3-4 yrs. ago  . Dysrhythmia     Atrila Fib  . Hearing loss   . Constipation   . Depression     Past Surgical History  Procedure Date  . Evacuation of subdural hematoma   . Eye surgery     cataract extracted in L eye  . Hernia repair     R side inguinal repair   . Brain surgery     2002  . Tonsillectomy     1957  . Cataract extraction w/phaco 01/30/2012    Procedure: CATARACT EXTRACTION PHACO AND INTRAOCULAR LENS PLACEMENT (IOC);  Surgeon: Shade Flood, MD;  Location:  [facility] OR;  Service: Ophthalmology;  Laterality: Right;  . Pars plana vitrectomy 07/30/2012    Procedure: PARS PLANA VITRECTOMY WITH 23 GAUGE;  Surgeon: Shade Flood, MD;  Location: [facility] OR;  Service: Ophthalmology;  Laterality: Right;  Insertion of Silicone Oil  . Gas/fluid exchange 07/30/2012    Procedure: GAS/FLUID EXCHANGE;  Surgeon: Shade Flood, MD;  Location: [facility] OR;  Service: Ophthalmology;  Laterality: Right;    Family History  Problem Relation Age of Onset  . Anesthesia problems Neg Hx   . Hypotension Neg Hx   . Malignant hyperthermia Neg Hx   . Pseudochol deficiency Neg Hx     History  Substance Use Topics  . Smoking status: Former Smoker -- 0.0 packs/day for 15 years    Quit date: 12/24/1994  . Smokeless tobacco: Not on file  . Alcohol Use: No      Review of Systems  All other systems reviewed and are negative.    Allergies  Review of patient's allergies indicates no known allergies.  Home Medications   Current Outpatient Rx  Name Route Sig Dispense Refill  . ATORVASTATIN CALCIUM 10 MG PO TABS Oral Take 10 mg by mouth daily.     . FUROSEMIDE 40 MG PO TABS Oral Take 40 mg by mouth daily.    Marland Kitchen HYDRALAZINE HCL  25 MG PO TABS Oral Take 25 mg by mouth 2 (two) times daily.     . ISOSORBIDE DINITRATE 20 MG PO TABS Oral Take 20 mg by mouth 2 (two) times daily.    . ADULT MULTIVITAMIN W/MINERALS CH Oral Take 1 tablet by mouth daily.    Marland Kitchen POTASSIUM CHLORIDE CRYS ER 20 MEQ PO TBCR Oral Take 20 mEq by mouth daily.     Marland Kitchen TAMSULOSIN HCL 0.4 MG PO CAPS Oral Take 0.4 mg by mouth daily.       BP 154/105  Pulse 59  Temp 97.9 F (36.6 C) (Oral)  Resp 20  SpO2 100%  Physical Exam  Nursing note and vitals reviewed. Constitutional: He appears well-developed and well-nourished. No distress.  HENT:  Head: Normocephalic and atraumatic.  Mouth/Throat: Oropharynx is clear and moist. No oropharyngeal exudate.  Neck: Normal range of motion.  Cardiovascular: Normal heart sounds.  An  irregularly irregular rhythm present.  Pulmonary/Chest: Effort normal and breath sounds normal. He exhibits no tenderness.  Abdominal: Soft. Bowel sounds are normal. There is no tenderness. There is no rebound and no guarding.  Musculoskeletal: Normal range of motion. He exhibits no edema.  Neurological: He is alert.  Skin: Skin is warm and dry. He is not diaphoretic.  Psychiatric: He has a normal mood and affect.    ED Course  Procedures (including critical care time)   Date: 08/24/2012  Rate: 86  Rhythm: atrial fibrillation  QRS Axis: left  Intervals: na  ST/T Wave abnormalities: nonspecific T wave changes  Conduction Disutrbances:none  Narrative Interpretation:   Old EKG Reviewed: as compared with Jun 24, 2012 no sig changes  Labs Reviewed  BASIC METABOLIC PANEL - Abnormal; Notable for the following:    Potassium 3.2 (*)     Glucose, Bld 105 (*)     GFR calc non Af Amer 65 (*)     GFR calc Af Amer 75 (*)     All other components within normal limits  CBC WITH DIFFERENTIAL - Abnormal; Notable for the following:    WBC 3.0 (*)     RBC 5.85 (*)     MCV 74.9 (*)     MCH 25.8 (*)     RDW 17.2 (*)     Neutro Abs 1.3 (*)     All other components within normal limits  POCT I-STAT TROPONIN I   Dg Chest 2 View  08/24/2012  *RADIOLOGY REPORT*  Clinical Data: Fever.  Back pain  CHEST - 2 VIEW  Comparison: 05/05/2012  Findings:  The heart size and mediastinal contours are within normal limits. Both lungs are clear. Multilevel thoracic spondylosis noted.  IMPRESSION: Negative exam.   Original Report Authenticated By: Rosealee Albee, M.D.      No diagnosis found.    MDM  Patient with history of GERD presents with symptoms consistent with his previous acid reflux symptoms. He did previously have an episode where he felt "sweaty" with this. He initially denied any shortness of breath, nausea, vomiting, palpitations, dizziness. EKG is consistent with previous, showing atrial  fibrillation with nonspecific T wave changes. Troponin is negative. Patient reports resolution of the burning sensation in his chest with administration of a GI cocktail. Patient was observed on the monitor while in the department and was noted by nursing staff to have episodic drops of his heart rate into the 30s, with rebound into the 50s and 60s. Patient did note that he felt dizzy with this, but continued  to deny any pain. Repeat troponin was again negative. Patient was ambulated a short distance and reported feeling dizzy with this. He did again drop his heart rate. Pt is not on any rate controlling drugs. Feel pt necessitates admission to observation to monitor HR. I spoke with Dr. Sharyn Lull who accepts pt for admission.     Grant Fontana, PA-C 08/24/12 1104

## 2012-08-24 NOTE — ED Notes (Signed)
Atropine override to keep at bedside. Given to Ed, Charity fundraiser

## 2012-08-24 NOTE — ED Notes (Signed)
MD and PA-C aware of pt. Having intermittent drops in heart rate, as low as 35; pt. Asymptomatic; no chest pain; repeat ekg done.

## 2012-08-25 DIAGNOSIS — I428 Other cardiomyopathies: Secondary | ICD-10-CM

## 2012-08-25 LAB — BASIC METABOLIC PANEL
Calcium: 9.1 mg/dL (ref 8.4–10.5)
GFR calc Af Amer: 85 mL/min — ABNORMAL LOW (ref >90)
GFR calc non Af Amer: 73 mL/min — ABNORMAL LOW (ref >90)
Glucose, Bld: 96 mg/dL (ref 70–99)
Sodium: 142 mEq/L (ref 135–145)

## 2012-08-25 LAB — CBC
Hemoglobin: 14.5 g/dL (ref 13.0–17.0)
MCH: 25.8 pg — ABNORMAL LOW (ref 26.0–34.0)
MCHC: 35 g/dL (ref 30.0–36.0)
RDW: 17.1 % — ABNORMAL HIGH (ref 11.5–15.5)

## 2012-08-25 LAB — LIPID PANEL
LDL Cholesterol: 63 mg/dL (ref 0–99)
Triglycerides: 38 mg/dL (ref <150)

## 2012-08-25 LAB — TROPONIN I: Troponin I: <0.3 ng/mL (ref <0.30)

## 2012-08-25 MED ORDER — SODIUM CHLORIDE 0.9 % IV SOLN
250.0000 mL | INTRAVENOUS | Status: DC
  Administered 2012-08-26: 250 mL via INTRAVENOUS

## 2012-08-25 NOTE — Progress Notes (Signed)
SUBJECTIVE: The patient is doing well today.  At this time, he denies chest pain, shortness of breath, or any new concerns.     Marland Kitchen aspirin  324 mg Oral NOW   Or  . aspirin  300 mg Rectal NOW  . aspirin EC  81 mg Oral Daily  . atorvastatin  10 mg Oral q1800  . atropine      .  ceFAZolin (ANCEF) IV  2 g Intravenous On Call  . furosemide  40 mg Oral Daily  . gentamicin irrigation  80 mg Irrigation On Call  . hydrALAZINE  25 mg Oral BID  . isosorbide dinitrate  20 mg Oral BID  . multivitamin with minerals  1 tablet Oral Daily  . pantoprazole  40 mg Oral Q0600  . potassium chloride SA  20 mEq Oral Daily  . potassium chloride  40 mEq Oral Once  . sodium chloride  3 mL Intravenous Q12H  . Tamsulosin HCl  0.4 mg Oral Daily  . DISCONTD:  ceFAZolin (ANCEF) IV  2 g Intravenous On Call  . DISCONTD: gentamicin irrigation  80 mg Irrigation On Call  . DISCONTD: heparin  5,000 Units Subcutaneous Q8H  . DISCONTD: sodium chloride  3 mL Intravenous Q12H      . sodium chloride    . sodium chloride    . DISCONTD: sodium chloride      OBJECTIVE: Physical Exam: Filed Vitals:   08/24/12 1400 08/24/12 2130 08/24/12 2200 08/25/12 0600  BP: 155/90 140/78 153/101 119/81  Pulse: 72 66 71 63  Temp: 97.6 F (36.4 C)  98.6 F (37 C) 98.1 F (36.7 C)  TempSrc: Oral     Resp: 18  17 18   Height: 6\' 1"  (1.854 m)     Weight: 162 lb 6.4 oz (73.664 kg)     SpO2: 99%  98% 100%    Intake/Output Summary (Last 24 hours) at 08/25/12 0830 Last data filed at 08/24/12 1348  Gross per 24 hour  Intake    240 ml  Output    100 ml  Net    140 ml    Telemetry reveals afib, V rates mostly 50s/60s though at times in the 30s.  GEN- The patient is well appearing, alert and oriented x 3 today.   Head- normocephalic, atraumatic Eyes-  Sclera clear, conjunctiva pink Ears- hearing intact Oropharynx- clear Neck- supple, no JVP Lymph- no cervical lymphadenopathy Lungs- Clear to ausculation bilaterally,  normal work of breathing Heart- irregular rate and rhythm  GI- soft, NT, ND, + BS Extremities- no clubbing, cyanosis, or edema    LABS: Basic Metabolic Panel:  Basename 08/25/12 0142 08/24/12 1430  NA 142 138  K 3.3* 3.4*  CL 108 105  CO2 25 23  GLUCOSE 96 139*  BUN 13 11  CREATININE 0.94 0.86  CALCIUM 9.1 9.3  MG -- 2.4  PHOS -- --   Liver Function Tests:  Basename 08/24/12 1430  AST 22  ALT 14  ALKPHOS 63  BILITOT 0.9  PROT 6.9  ALBUMIN 3.5   No results found for this basename: LIPASE:2,AMYLASE:2 in the last 72 hours CBC:  Basename 08/25/12 0142 08/24/12 1430 08/24/12 0527  WBC 3.2* 3.2* --  NEUTROABS -- 1.4* 1.3*  HGB 14.5 14.9 --  HCT 41.4 42.1 --  MCV 73.8* 74.5* --  PLT 172 182 --   Cardiac Enzymes:  Basename 08/25/12 0141 08/24/12 1950 08/24/12 1430  CKTOTAL -- -- --  CKMB -- -- --  CKMBINDEX -- -- --  TROPONINI <0.30 <0.30 <0.30   Fasting Lipid Panel:  Basename 08/25/12 0142  CHOL 120  HDL 49  LDLCALC 63  TRIG 38  CHOLHDL 2.4  LDLDIRECT --   Thyroid Function Tests:  Basename 08/24/12 1430  TSH 0.870  T4TOTAL --  T3FREE --  THYROIDAB --   ASSESSMENT AND PLAN:  Active Problems:  Nonischemic/ ischemic cardiomyopathy  Bradycardia  Atrial fibrillation-permanent  1. Symptomatic bradycardia The patient has symptomatic bradycardia without reversible cause.  I would therefore recommend pacemaker implantation at this time.  Risks, benefits, alternatives to pacemaker implantation were discussed in detail with the patient today. The patient understands that the risks include but are not limited to bleeding, infection, pneumothorax, perforation, tamponade, vascular damage, renal failure, MI, stroke, death,  and lead dislodgement and wishes to proceed. We will therefore schedule the procedure at the next available time.  He will tentatively be placed on the schedule for tomorrow.  2. Permanent atrial fibrillation Rate controlled off of AV nodal  agents Poor candidate for anticoagulation long term  3. Nonischemic CM Stable Medical therapy long term. Given advanced age, he is not a candidate for an ICD.  His QRS is only .  Given his advanced age, I would not recommend CRT.  4. Hypokalemia- will replete  Hold heparin for the procedure tomorrow.   Hillis Range, MD 08/25/2012 8:30 AM

## 2012-08-25 NOTE — ED Provider Notes (Signed)
Medical screening examination/treatment/procedure(s) were performed by non-physician practitioner and as supervising physician I was immediately available for consultation/collaboration.  Mardy Lucier, MD 08/25/12 0739 

## 2012-08-25 NOTE — Plan of Care (Deleted)
Problem: Phase III Progression Outcomes Goal: Voiding independently Outcome: Not Met (add Reason) Suprapubic cath r/t prostrate ca

## 2012-08-25 NOTE — Progress Notes (Signed)
Subjective:  Patient denies any chest pain states and mild shortness of breath last night but feels okay now. Denies any further episodes of dizziness. Appreciate EP consult and help  Objective:  Vital Signs in the last 24 hours: Temp:  [97.3 F (36.3 C)-98.6 F (37 C)] 98.1 F (36.7 C) (09/02 0600) Pulse Rate:  [42-72] 63  (09/02 0600) Resp:  [16-18] 18  (09/02 0600) BP: (119-155)/(77-101) 119/81 mmHg (09/02 0600) SpO2:  [98 %-100 %] 100 % (09/02 0600) Weight:  [73.664 kg (162 lb 6.4 oz)] 73.664 kg (162 lb 6.4 oz) (09/01 1400)  Intake/Output from previous day: 09/01 0701 - 09/02 0700 In: 240 [P.O.:240] Out: 100 [Urine:100] Intake/Output from this shift:    Physical Exam: Neck: no adenopathy, no carotid bruit, no JVD and supple, symmetrical, trachea midline Lungs: clear to auscultation bilaterally Heart: irregularly irregular rhythm, S1, S2 normal and Soft systolic and diastolic murmur at left lower sternal border Abdomen: soft, non-tender; bowel sounds normal; no masses,  no organomegaly Extremities: extremities normal, atraumatic, no cyanosis or edema  Lab Results:  Basename 08/25/12 0142 08/24/12 1430  WBC 3.2* 3.2*  HGB 14.5 14.9  PLT 172 182    Basename 08/25/12 0142 08/24/12 1430  NA 142 138  K 3.3* 3.4*  CL 108 105  CO2 25 23  GLUCOSE 96 139*  BUN 13 11  CREATININE 0.94 0.86    Basename 08/25/12 0141 08/24/12 1950  TROPONINI <0.30 <0.30   Hepatic Function Panel  Basename 08/24/12 1430  PROT 6.9  ALBUMIN 3.5  AST 22  ALT 14  ALKPHOS 63  BILITOT 0.9  BILIDIR --  IBILI --    Basename 08/25/12 0142  CHOL 120   No results found for this basename: PROTIME in the last 72 hours  Imaging: Imaging results have been reviewed and Dg Chest 2 View  08/24/2012  *RADIOLOGY REPORT*  Clinical Data: Fever.  Back pain  CHEST - 2 VIEW  Comparison: 05/05/2012  Findings:  The heart size and mediastinal contours are within normal limits. Both lungs are clear.  Multilevel thoracic spondylosis noted.  IMPRESSION: Negative exam.   Original Report Authenticated By: Rosealee Albee, M.D.     Cardiac Studies:  Assessment/Plan:  Symptomatic bradycardia  A. fib with high-grade AV block with slow ventricular response  History of up to 3 seconds pause in the past Status post atypical chest pain  Mild coronary artery disease  Nonischemic dilated cardiomyopathy  Compensated systolic heart failure  Hypertension  GERD  Hyperlipidemia  History of subdural hematoma  Hypokalemia Plan Scheduled for permanent pacemaker tomorrow Replace K.  LOS: 1 day    Londell Noll N 08/25/2012, 9:37 AM

## 2012-08-26 ENCOUNTER — Encounter (HOSPITAL_COMMUNITY)
Admission: EM | Disposition: A | Discharge status: Home Health | Payer: Self-pay | Source: Home / Self Care | Attending: Cardiology

## 2012-08-26 ENCOUNTER — Encounter: Payer: Self-pay | Admitting: *Deleted

## 2012-08-26 DIAGNOSIS — I498 Other specified cardiac arrhythmias: Secondary | ICD-10-CM

## 2012-08-26 DIAGNOSIS — Z950 Presence of cardiac pacemaker: Secondary | ICD-10-CM | POA: Diagnosis present

## 2012-08-26 HISTORY — DX: Presence of cardiac pacemaker: Z95.0

## 2012-08-26 HISTORY — PX: PACEMAKER INSERTION: SHX728

## 2012-08-26 HISTORY — PX: PERMANENT PACEMAKER INSERTION: SHX5480

## 2012-08-26 LAB — BASIC METABOLIC PANEL
CO2: 25 mEq/L (ref 19–32)
Chloride: 105 mEq/L (ref 96–112)
Creatinine, Ser: 0.86 mg/dL (ref 0.50–1.35)
Sodium: 139 mEq/L (ref 135–145)

## 2012-08-26 SURGERY — PERMANENT PACEMAKER INSERTION
Anesthesia: LOCAL

## 2012-08-26 MED ORDER — SODIUM CHLORIDE 0.9 % IV SOLN: 250.0000 mL | INTRAVENOUS | Status: DC | PRN

## 2012-08-26 MED ORDER — SPIRONOLACTONE 25 MG PO TABS
25.0000 mg | ORAL_TABLET | Freq: Every day | ORAL | Status: DC
  Administered 2012-08-26 – 2012-08-28 (×3): 25 mg via ORAL
  Filled 2012-08-26 (×4): qty 1

## 2012-08-26 MED ORDER — FENTANYL CITRATE 0.05 MG/ML IJ SOLN
INTRAMUSCULAR | Status: AC
  Filled 2012-08-26: qty 2

## 2012-08-26 MED ORDER — SODIUM CHLORIDE 0.9 % IJ SOLN: 3.0000 mL | INTRAMUSCULAR | Status: DC | PRN

## 2012-08-26 MED ORDER — HEPARIN (PORCINE) IN NACL 2-0.9 UNIT/ML-% IJ SOLN
INTRAMUSCULAR | Status: AC
  Filled 2012-08-26: qty 1000

## 2012-08-26 MED ORDER — POTASSIUM CHLORIDE CRYS ER 20 MEQ PO TBCR
40.0000 meq | EXTENDED_RELEASE_TABLET | Freq: Once | ORAL | Status: AC
  Administered 2012-08-26: 40 meq via ORAL
  Filled 2012-08-26: qty 2

## 2012-08-26 MED ORDER — ACETAMINOPHEN 325 MG PO TABS: 325.0000 mg | ORAL_TABLET | ORAL | Status: DC | PRN

## 2012-08-26 MED ORDER — HYDROCODONE-ACETAMINOPHEN 5-325 MG PO TABS
1.0000 | ORAL_TABLET | ORAL | Status: DC | PRN
  Administered 2012-08-26: 1 via ORAL
  Filled 2012-08-26: qty 1

## 2012-08-26 MED ORDER — CARVEDILOL 3.125 MG PO TABS
3.1250 mg | ORAL_TABLET | Freq: Two times a day (BID) | ORAL | Status: DC
  Administered 2012-08-27 – 2012-08-28 (×3): 3.125 mg via ORAL
  Filled 2012-08-26 (×5): qty 1

## 2012-08-26 MED ORDER — MIDAZOLAM HCL 5 MG/5ML IJ SOLN
INTRAMUSCULAR | Status: AC
  Filled 2012-08-26: qty 5

## 2012-08-26 MED ORDER — CEFAZOLIN SODIUM 1-5 GM-% IV SOLN
1.0000 g | Freq: Four times a day (QID) | INTRAVENOUS | Status: AC
  Administered 2012-08-26 (×2): 1 g via INTRAVENOUS
  Filled 2012-08-26 (×3): qty 50

## 2012-08-26 MED ORDER — SODIUM CHLORIDE 0.9 % IJ SOLN
3.0000 mL | Freq: Two times a day (BID) | INTRAMUSCULAR | Status: DC
  Administered 2012-08-27 (×2): 3 mL via INTRAVENOUS

## 2012-08-26 MED ORDER — ONDANSETRON HCL 4 MG/2ML IJ SOLN: 4.0000 mg | Freq: Four times a day (QID) | INTRAMUSCULAR | Status: DC | PRN

## 2012-08-26 MED ORDER — LIDOCAINE HCL (PF) 1 % IJ SOLN
INTRAMUSCULAR | Status: AC
  Filled 2012-08-26: qty 60

## 2012-08-26 MED ORDER — POTASSIUM CHLORIDE CRYS ER 20 MEQ PO TBCR
40.0000 meq | EXTENDED_RELEASE_TABLET | Freq: Every day | ORAL | Status: DC
  Administered 2012-08-26 – 2012-08-27 (×2): 40 meq via ORAL
  Filled 2012-08-26 (×2): qty 2

## 2012-08-26 NOTE — Interval H&P Note (Signed)
History and Physical Interval Note:  08/26/2012 10:04 AM  [name] [name]  has presented today for surgery, with the diagnosis of bradycardia  The various methods of treatment have been discussed with the patient and family. After consideration of risks, benefits and other options for treatment, the patient has consented to  Procedure(s) (LRB) with comments: PERMANENT PACEMAKER INSERTION (N/A) as a surgical intervention .  The patient's history has been reviewed, patient examined, no change in status, stable for surgery.  I have reviewed the patient's chart and labs.  Questions were answered to the patient's satisfaction.     Hillis Range

## 2012-08-26 NOTE — Progress Notes (Signed)
Subjective:  Patient denies any chest pain or shortness of breath tolerated insertion of permanent pacemaker today well without any complications  Objective:  Vital Signs in the last 24 hours: Temp:  [97.2 F (36.2 C)-98.3 F (36.8 C)] 97.2 F (36.2 C) (09/03 1318) Pulse Rate:  [59-91] 75  (09/03 1500) Resp:  [15-18] 18  (09/03 1318) BP: (118-144)/(70-90) 128/81 mmHg (09/03 1500) SpO2:  [98 %-100 %] 100 % (09/03 1318)  Intake/Output from previous day:   Intake/Output from this shift: Total I/O In: 240 [P.O.:240] Out: 300 [Urine:300]  Physical Exam: Neck: no adenopathy, no carotid bruit, no JVD and supple, symmetrical, trachea midline Lungs: clear to auscultation bilaterally Heart: irregularly irregular rhythm, S1, S2 normal and Soft systolic and diastolic murmur noted at left lower sternal border Abdomen: soft, non-tender; bowel sounds normal; no masses,  no organomegaly Extremities: extremities normal, atraumatic, no cyanosis or edema  Lab Results:  Basename 08/25/12 0142 08/24/12 1430  WBC 3.2* 3.2*  HGB 14.5 14.9  PLT 172 182    Basename 08/26/12 0530 08/25/12 0142  NA 139 142  K 3.2* 3.3*  CL 105 108  CO2 25 25  GLUCOSE 89 96  BUN 14 13  CREATININE 0.86 0.94    Basename 08/25/12 0141 08/24/12 1950  TROPONINI <0.30 <0.30   Hepatic Function Panel  Basename 08/24/12 1430  PROT 6.9  ALBUMIN 3.5  AST 22  ALT 14  ALKPHOS 63  BILITOT 0.9  BILIDIR --  IBILI --    Basename 08/25/12 0142  CHOL 120   No results found for this basename: PROTIME in the last 72 hours  Imaging: Imaging results have been reviewed and No results found.  Cardiac Studies:  Assessment/Plan:  Symptomatic bradycardia status post [employer] Jude dual-chamber pacemaker A. fib with high-grade AV block with slow ventricular response History of up to 3 seconds pause in the past  Status post atypical chest pain  Mild coronary artery disease  Nonischemic dilated cardiomyopathy    Compensated systolic heart failure  Hypertension  GERD  Hyperlipidemia  History of subdural hematoma  Hypokalemia  Plan Replace K. Check labs in a.m. Social service for discharge planning  LOS: 2 days    Blasa Raisch N 08/26/2012, 6:07 PM

## 2012-08-26 NOTE — Progress Notes (Signed)
  Echocardiogram 2D Echocardiogram has been performed.  [name] [name] 08/26/2012, 9:22 AM

## 2012-08-26 NOTE — Progress Notes (Signed)
Orthopedic Tech Progress Note Patient Details:  [name] [name] 1923/01/20 [id number]  Patient ID: [name], male   DOB: 1923/08/27, 78 y.o.   MRN: [id number]   [name] 08/26/2012, 1:44 PM PT HAS ARM SLING

## 2012-08-26 NOTE — H&P (View-Only) (Signed)
 SUBJECTIVE: The patient is doing well today.  At this time, he denies chest pain, shortness of breath, or any new concerns.     . aspirin  324 mg Oral NOW   Or  . aspirin  300 mg Rectal NOW  . aspirin EC  81 mg Oral Daily  . atorvastatin  10 mg Oral q1800  . atropine      .  ceFAZolin (ANCEF) IV  2 g Intravenous On Call  . furosemide  40 mg Oral Daily  . gentamicin irrigation  80 mg Irrigation On Call  . hydrALAZINE  25 mg Oral BID  . isosorbide dinitrate  20 mg Oral BID  . multivitamin with minerals  1 tablet Oral Daily  . pantoprazole  40 mg Oral Q0600  . potassium chloride SA  20 mEq Oral Daily  . potassium chloride  40 mEq Oral Once  . sodium chloride  3 mL Intravenous Q12H  . Tamsulosin HCl  0.4 mg Oral Daily  . DISCONTD:  ceFAZolin (ANCEF) IV  2 g Intravenous On Call  . DISCONTD: gentamicin irrigation  80 mg Irrigation On Call  . DISCONTD: heparin  5,000 Units Subcutaneous Q8H  . DISCONTD: sodium chloride  3 mL Intravenous Q12H      . sodium chloride    . sodium chloride    . DISCONTD: sodium chloride      OBJECTIVE: Physical Exam: Filed Vitals:   08/24/12 1400 08/24/12 2130 08/24/12 2200 08/25/12 0600  BP: 155/90 140/78 153/101 119/81  Pulse: 72 66 71 63  Temp: 97.6 F (36.4 C)  98.6 F (37 C) 98.1 F (36.7 C)  TempSrc: Oral     Resp: 18  17 18  Height: 6' 1" (1.854 m)     Weight: 162 lb 6.4 oz (73.664 kg)     SpO2: 99%  98% 100%    Intake/Output Summary (Last 24 hours) at 08/25/12 0830 Last data filed at 08/24/12 1348  Gross per 24 hour  Intake    240 ml  Output    100 ml  Net    140 ml    Telemetry reveals afib, V rates mostly 50s/60s though at times in the 30s.  GEN- The patient is well appearing, alert and oriented x 3 today.   Head- normocephalic, atraumatic Eyes-  Sclera clear, conjunctiva pink Ears- hearing intact Oropharynx- clear Neck- supple, no JVP Lymph- no cervical lymphadenopathy Lungs- Clear to ausculation bilaterally,  normal work of breathing Heart- irregular rate and rhythm  GI- soft, NT, ND, + BS Extremities- no clubbing, cyanosis, or edema    LABS: Basic Metabolic Panel:  Basename 08/25/12 0142 08/24/12 1430  NA 142 138  K 3.3* 3.4*  CL 108 105  CO2 25 23  GLUCOSE 96 139*  BUN 13 11  CREATININE 0.94 0.86  CALCIUM 9.1 9.3  MG -- 2.4  PHOS -- --   Liver Function Tests:  Basename 08/24/12 1430  AST 22  ALT 14  ALKPHOS 63  BILITOT 0.9  PROT 6.9  ALBUMIN 3.5   No results found for this basename: LIPASE:2,AMYLASE:2 in the last 72 hours CBC:  Basename 08/25/12 0142 08/24/12 1430 08/24/12 0527  WBC 3.2* 3.2* --  NEUTROABS -- 1.4* 1.3*  HGB 14.5 14.9 --  HCT 41.4 42.1 --  MCV 73.8* 74.5* --  PLT 172 182 --   Cardiac Enzymes:  Basename 08/25/12 0141 08/24/12 1950 08/24/12 1430  CKTOTAL -- -- --  CKMB -- -- --    CKMBINDEX -- -- --  TROPONINI <0.30 <0.30 <0.30   Fasting Lipid Panel:  Basename 08/25/12 0142  CHOL 120  HDL 49  LDLCALC 63  TRIG 38  CHOLHDL 2.4  LDLDIRECT --   Thyroid Function Tests:  Basename 08/24/12 1430  TSH 0.870  T4TOTAL --  T3FREE --  THYROIDAB --   ASSESSMENT AND PLAN:  Active Problems:  Nonischemic/ ischemic cardiomyopathy  Bradycardia  Atrial fibrillation-permanent  1. Symptomatic bradycardia The patient has symptomatic bradycardia without reversible cause.  I would therefore recommend pacemaker implantation at this time.  Risks, benefits, alternatives to pacemaker implantation were discussed in detail with the patient today. The patient understands that the risks include but are not limited to bleeding, infection, pneumothorax, perforation, tamponade, vascular damage, renal failure, MI, stroke, death,  and lead dislodgement and wishes to proceed. We will therefore schedule the procedure at the next available time.  He will tentatively be placed on the schedule for tomorrow.  2. Permanent atrial fibrillation Rate controlled off of AV nodal  agents Poor candidate for anticoagulation long term  3. Nonischemic CM Stable Medical therapy long term. Given advanced age, he is not a candidate for an ICD.  His QRS is only 130msec.  Given his advanced age, I would not recommend CRT.  4. Hypokalemia- will replete  Hold heparin for the procedure tomorrow.   Alin Hutchins, MD 08/25/2012 8:30 AM  

## 2012-08-26 NOTE — Op Note (Signed)
SURGEON:  Hillis Range, MD     PREPROCEDURE DIAGNOSIS:  Symptomatic Bradycardia, permanent atrial fibrillation    POSTPROCEDURE DIAGNOSIS:  Symptomatic Bradycardia, permanent atrial fibrillation     PROCEDURES:   1. Left upper extremity venography.   2. Pacemaker implantation.     INTRODUCTION: [name] [name] is a 78 y.o. male  with a history of symptomatic bradycardia and permanent atrial fibrillation who presents today for pacemaker implantation.  The patient reports intermittent episodes of dizziness over the past few days.  No reversible causes have been identified.  The patient therefore presents today for pacemaker implantation.     DESCRIPTION OF PROCEDURE:  Informed written consent was obtained, and the patient was brought to the electrophysiology lab in a fasting state.  The patient required no sedation for the procedure today.  The patients left chest was prepped and draped in the usual sterile fashion by the EP lab staff. The skin overlying the left deltopectoral region was infiltrated with lidocaine for local analgesia.  A 4-cm incision was made over the left deltopectoral region.  A left subcutaneous pacemaker pocket was fashioned using a combination of sharp and blunt dissection. Electrocautery was required to assure hemostasis.    Left Upper Extremity Venography: A venogram of the left upper extremity was performed, which revealed a large very small cephalic vein, which emptied into a large left subclavian vein.  The left axillary vein was moderate in size.  There were many small collateral vessels.  RA/RV Lead Placement: The left axillary vein was therefore cannulated.  Through the left axillary vein, a [employer] model [id number] (serial number [id number]) right ventricular lead were advanced with fluoroscopic visualization into the right ventricular apex positions respectively.  Initial right ventricular lead R-waves measured  9-15 mV with an impedance of 769 ohms and a threshold  of 0.4 V at 0.5 msec.  The lead was secured to the pectoralis fascia using #2-0 silk over the suture sleeve.   Device Placement:  The lead was then connected to a [employer] RF model [id number] (serial number [id number] ) pacemaker.  The pocket was irrigated with copious gentamicin solution.  The pacemaker was then placed into the pocket.  The pocket was then closed in 2 layers with 2.0 Vicryl suture for the subcutaneous and subcuticular layers.  Steri-   Strips and a sterile dressing were then applied.  There were no early apparent complications.     CONCLUSIONS:   1. Successful implantation of a Industrial/product [employer] SR RF dual-chamber pacemaker for symptomatic bradycardia  2. No early apparent complications.           Hillis Range, MD 08/26/2012 12:31 PM

## 2012-08-27 ENCOUNTER — Inpatient Hospital Stay (HOSPITAL_COMMUNITY): Payer: Medicare Other

## 2012-08-27 LAB — MAGNESIUM: Magnesium: 2.1 mg/dL (ref 1.5–2.5)

## 2012-08-27 LAB — BASIC METABOLIC PANEL
CO2: 22 mEq/L (ref 19–32)
Calcium: 9.3 mg/dL (ref 8.4–10.5)
Chloride: 111 mEq/L (ref 96–112)
Glucose, Bld: 87 mg/dL (ref 70–99)
Potassium: 4.5 mEq/L (ref 3.5–5.1)
Sodium: 143 mEq/L (ref 135–145)

## 2012-08-27 MED ORDER — CEFAZOLIN SODIUM 1-5 GM-% IV SOLN
1.0000 g | Freq: Once | INTRAVENOUS | Status: AC
  Administered 2012-08-27: 1 g via INTRAVENOUS

## 2012-08-27 MED ORDER — RAMIPRIL 2.5 MG PO CAPS
2.5000 mg | ORAL_CAPSULE | Freq: Every day | ORAL | Status: DC
  Administered 2012-08-27 – 2012-08-28 (×2): 2.5 mg via ORAL
  Filled 2012-08-27 (×2): qty 1

## 2012-08-27 MED ORDER — SALINE SPRAY 0.65 % NA SOLN
1.0000 | NASAL | Status: DC | PRN
  Filled 2012-08-27: qty 44

## 2012-08-27 NOTE — Progress Notes (Signed)
CSW received referral for disposition needs. Per discussion with pt  nurse during progression, and chart review, pt lives at home alone with an Home health Aid. Pt RN, will request Pt/ot consult to determine pt disposition needs. .Clinical social worker continuing to follow pt to assist with pt dc plans and further csw needs.   Catha Gosselin, Theresia Majors  [phone] .08/27/2012 10:10am

## 2012-08-27 NOTE — Progress Notes (Signed)
Pt was resting in bed and had 8 beats of wide QRS. Pt denied any chest pain or SOB. Pt is A flutter on the monitor with HR in the 60-70's.  Md on call made aware. No new orders received at this time. Will cont to monitor pt.

## 2012-08-27 NOTE — Progress Notes (Addendum)
SUBJECTIVE: The patient is doing well today.  At this time, he denies chest pain, shortness of breath, or any new concerns.  He has routine L shoulder soreness.     Marland Kitchen aspirin EC  81 mg Oral Daily  . atorvastatin  10 mg Oral q1800  . carvedilol  3.125 mg Oral BID WC  .  ceFAZolin (ANCEF) IV  1 g Intravenous Q6H  .  ceFAZolin (ANCEF) IV  1 g Intravenous Once  . heparin      . isosorbide dinitrate  20 mg Oral BID  . lidocaine      . multivitamin with minerals  1 tablet Oral Daily  . pantoprazole  40 mg Oral Q0600  . potassium chloride SA  40 mEq Oral Daily  . potassium chloride  40 mEq Oral Once  . sodium chloride  3 mL Intravenous Q12H  . spironolactone  25 mg Oral Daily  . Tamsulosin HCl  0.4 mg Oral Daily  . DISCONTD:  ceFAZolin (ANCEF) IV  2 g Intravenous On Call  . DISCONTD: furosemide  40 mg Oral Daily  . DISCONTD: gentamicin irrigation  80 mg Irrigation On Call  . DISCONTD: hydrALAZINE  25 mg Oral BID  . DISCONTD: potassium chloride SA  20 mEq Oral Daily  . DISCONTD: sodium chloride  3 mL Intravenous Q12H      . DISCONTD: sodium chloride    . DISCONTD: sodium chloride 250 mL (08/26/12 0244)    OBJECTIVE: Physical Exam: Filed Vitals:   08/26/12 1500 08/26/12 1600 08/26/12 2100 08/27/12 0500  BP: 128/81 128/75 126/82 121/83  Pulse: 75 60 67 68  Temp:   98.2 F (36.8 C) 97.4 F (36.3 C)  TempSrc:   Oral Oral  Resp:   18 18  Height:      Weight:      SpO2:   97% 95%    Intake/Output Summary (Last 24 hours) at 08/27/12 0847 Last data filed at 08/27/12 0834  Gross per 24 hour  Intake    600 ml  Output    650 ml  Net    -50 ml    Telemetry reveals afib, intermittent demand V pacing  GEN- The patient is well appearing, alert and oriented x 3 today.   Head- normocephalic, atraumatic Eyes-  Sclera clear, conjunctiva pink Ears- hearing intact Oropharynx- clear Neck- supple, no JVP Lymph- no cervical lymphadenopathy Lungs- Clear to ausculation  bilaterally, normal work of breathing Heart- irregular rate and rhythm,  GI- soft, NT, ND, + BS Extremities- no clubbing, cyanosis, or edema Skin- pacemaker site is without hematoma  LABS: Basic Metabolic Panel:  Basename 08/27/12 0525 08/26/12 0530 08/24/12 1430  NA 143 139 --  K 4.5 3.2* --  CL 111 105 --  CO2 22 25 --  GLUCOSE 87 89 --  BUN 17 14 --  CREATININE 0.90 0.86 --  CALCIUM 9.3 8.9 --  MG 2.1 -- 2.4  PHOS -- -- --   Liver Function Tests:  Basename 08/24/12 1430  AST 22  ALT 14  ALKPHOS 63  BILITOT 0.9  PROT 6.9  ALBUMIN 3.5   No results found for this basename: LIPASE:2,AMYLASE:2 in the last 72 hours CBC:  Basename 08/25/12 0142 08/24/12 1430  WBC 3.2* 3.2*  NEUTROABS -- 1.4*  HGB 14.5 14.9  HCT 41.4 42.1  MCV 73.8* 74.5*  PLT 172 182   Cardiac Enzymes:  Basename 08/25/12 0141 08/24/12 1950 08/24/12 1430  CKTOTAL -- -- --  CKMB -- -- --  CKMBINDEX -- -- --  TROPONINI <0.30 <0.30 <0.30  Fasting Lipid Panel:  Basename 08/25/12 0142  CHOL 120  HDL 49  LDLCALC 63  TRIG 38  CHOLHDL 2.4  LDLDIRECT --   Thyroid Function Tests:  Basename 08/24/12 1430  TSH 0.870  T4TOTAL --  T3FREE --  THYROIDAB --   ASSESSMENT AND PLAN:  Active Problems:  Nonischemic/ ischemic cardiomyopathy  Bradycardia  Atrial fibrillation-permanent  1. Tachy/brady syndrome- doing well s/p PPM Device interrogation is reviewed (see paper chart), CXR reviewed Routine wound care Follow-up in my office for a wound check in 10 days and 3 months with me  2. afib- will defer anticoagulation decision to Dr Sharyn Lull  OK to discharge from my standpoint.  I will see as needed while here. Please call with questions.   Hillis Range, MD 08/27/2012 8:47 AM

## 2012-08-27 NOTE — Progress Notes (Signed)
Subjective:  Patient denies any chest pain or shortness of breath. Complains of soreness in the left infraclavicular area Objective:  Vital Signs in the last 24 hours: Temp:  [97.2 F (36.2 C)-98.2 F (36.8 C)] 97.4 F (36.3 C) (09/04 0500) Pulse Rate:  [59-75] 68  (09/04 0500) Resp:  [18] 18  (09/04 0500) BP: (118-144)/(70-85) 134/85 mmHg (09/04 1026) SpO2:  [95 %-100 %] 95 % (09/04 0500)  Intake/Output from previous day: 09/03 0701 - 09/04 0700 In: 240 [P.O.:240] Out: 800 [Urine:800] Intake/Output from this shift: Total I/O In: 363 [P.O.:360; I.V.:3] Out: 250 [Urine:250]  Physical Exam: Neck: no adenopathy, no carotid bruit, no JVD and supple, symmetrical, trachea midline Lungs: clear to auscultation bilaterally Heart: irregularly irregular rhythm, S1, S2 normal and Soft systolic and diastolic murmur noted. Pacer site dressing dry and no evidence of hematoma Abdomen: soft, non-tender; bowel sounds normal; no masses,  no organomegaly Extremities: extremities normal, atraumatic, no cyanosis or edema  Lab Results:  Basename 08/25/12 0142 08/24/12 1430  WBC 3.2* 3.2*  HGB 14.5 14.9  PLT 172 182    Basename 08/27/12 0525 08/26/12 0530  NA 143 139  K 4.5 3.2*  CL 111 105  CO2 22 25  GLUCOSE 87 89  BUN 17 14  CREATININE 0.90 0.86    Basename 08/25/12 0141 08/24/12 1950  TROPONINI <0.30 <0.30   Hepatic Function Panel  Basename 08/24/12 1430  PROT 6.9  ALBUMIN 3.5  AST 22  ALT 14  ALKPHOS 63  BILITOT 0.9  BILIDIR --  IBILI --    Basename 08/25/12 0142  CHOL 120   No results found for this basename: PROTIME in the last 72 hours  Imaging: Imaging results have been reviewed and Dg Chest 2 View  08/27/2012  *RADIOLOGY REPORT*  Clinical Data: Post pacemaker insertion.  CHEST - 2 VIEW  Comparison: 08/24/2012.  Findings: Trachea is midline.  Heart is mildly enlarged, stable. Thoracic aorta is calcified.  Left subclavian pacemaker lead tip projects over the right  ventricle.  No pneumothorax.  The lungs are somewhat low in volume with bibasilar atelectasis and/or scarring. Trace left pleural fluid.  Extensive degenerative change in the spine.  IMPRESSION:  1.  Left subclavian pacemaker insertion without pneumothorax. 2.  Tiny left pleural effusion. 3.  Bibasilar atelectasis and/or scarring.   Original Report Authenticated By: Reyes Ivan, M.D.     Cardiac Studies:  Assessment/Plan:  Symptomatic bradycardia status post St. Jude dual-chamber pacemaker  A. fib with high-grade AV block with slow ventricular response History of up to 3 seconds pause in the past  Status post atypical chest pain  Mild coronary artery disease  Nonischemic dilated cardiomyopathy  Compensated systolic heart failure  Hypertension  GERD  Hyperlipidemia  History of subdural hematoma Plan [place] PT consult Add low-dose ACE inhibitors Check renal function in a.m. DC KCL  LOS: 3 days    [name] [name] N 08/27/2012, 12:56 PM

## 2012-08-27 NOTE — Progress Notes (Signed)
UR Completed Zameria Vogl Graves-Bigelow, RN,BSN [phone]  

## 2012-08-27 NOTE — Care Management Note (Signed)
    Page 1 of 1   08/28/2012     2:48:19 PM   CARE MANAGEMENT NOTE 08/28/2012  Patient:  [name] [name],[name] [name]   Account Number:  [id number]  Date Initiated:  08/27/2012  Documentation initiated by:  GRAVES-BIGELOW,Stayce Delancy  Subjective/Objective Assessment:   Pt admitted with dizziness. S/p Pacemaker implantation.  Pt is from home alone.     Action/Plan:   CM will continue to monitor for disposition needs.   Anticipated DC Date:  08/29/2012   Anticipated DC Plan:  HOME W HOME HEALTH SERVICES      DC Planning Services  CM consult      [facility] Choice  HOME HEALTH   Choice offered to / List presented to:  C-1 Patient        HH arranged  HH-1 RN  HH-10 DISEASE MANAGEMENT  HH-6 SOCIAL WORKER      Status of service:  Completed, signed off Medicare Important Message given?   (If response is "NO", the following Medicare IM given date fields will be blank) Date Medicare IM given:   Date Additional Medicare IM given:    Discharge Disposition:  HOME W HOME HEALTH SERVICES  Per UR Regulation:  Reviewed for med. necessity/level of care/duration of stay  If discussed at Long Length of Stay Meetings, dates discussed:    Comments:  08-27-12 Tomi Bamberger, RN,BSN [phone] CM did speak to pt and pt chose [facility] for services listed above. RN placed orders in computer. Plan for d/c today. SOC to begin within 24-48 hours post d/c. CM did speak to daughter to make sure she contacted PCP and his social worker at social services for assistance with increasing aide hours with [facility]. No further needs from CM at this time.

## 2012-08-28 LAB — BASIC METABOLIC PANEL
CO2: 23 mEq/L (ref 19–32)
Calcium: 9.5 mg/dL (ref 8.4–10.5)
Potassium: 4.3 mEq/L (ref 3.5–5.1)
Sodium: 142 mEq/L (ref 135–145)

## 2012-08-28 MED ORDER — ASPIRIN 81 MG PO TBEC: 81.0000 mg | DELAYED_RELEASE_TABLET | Freq: Every day | ORAL | Status: AC

## 2012-08-28 MED ORDER — CARVEDILOL 6.25 MG PO TABS: 6.2500 mg | ORAL_TABLET | Freq: Two times a day (BID) | ORAL | Status: DC

## 2012-08-28 MED ORDER — SPIRONOLACTONE 25 MG PO TABS: 25.0000 mg | ORAL_TABLET | Freq: Every day | ORAL | Status: DC

## 2012-08-28 MED ORDER — RAMIPRIL 2.5 MG PO CAPS: 2.5000 mg | ORAL_CAPSULE | Freq: Every day | ORAL | Status: AC

## 2012-08-28 NOTE — Progress Notes (Signed)
DC orders received.  Patient stable with no S/S of distress.  Medication, arm restriction and discharge information reviewed with patient and patient's daughter.  Patient DC home. [name] [name]

## 2012-08-28 NOTE — Evaluation (Signed)
Occupational Therapy Evaluation Patient Details Name: [name] [name] MRN: [id number] DOB: 09-May-1923 Today's Date: 08/28/2012 Time: 6295-2841 OT Time Calculation (min): 20 min  OT Assessment / Plan / Recommendation Clinical Impression  Pt admitted for pacemaker placement and now has deficits noted below.  Pt would benefit from con't OT to increase safety and I with adls so he can return  home alone safely.    OT Assessment  Patient needs continued OT Services    Follow Up Recommendations  Home health OT ( may or may not need)   Barriers to Discharge Decreased caregiver support Pt lives alone.  Caregiver in 2 hours a day in am.  Daughter can assist. Pt is worried about getting sick or falling during night time hours b/c he has a gate to his house that he keeps locked and states EMS will have trouble getting to him.  Pt has a lifeline but again states he does not know how they will get to him b/c of gate but refuses to leave gate unlocked.  Equipment Recommendations  3 in 1 bedside comode    Recommendations for Other Services    Frequency  Min 2X/week    Precautions / Restrictions Precautions Precautions: Fall;ICD/Pacemaker Restrictions Weight Bearing Restrictions: No   Pertinent Vitals/Pain Pt c/o no pain.    ADL  Eating/Feeding: Performed;Independent Where Assessed - Eating/Feeding: Chair Grooming: Performed;Wash/dry hands;Supervision/safety Where Assessed - Grooming: Unsupported standing Upper Body Bathing: Simulated;Set up Where Assessed - Upper Body Bathing: Supported sitting Lower Body Bathing: Simulated;Supervision/safety Where Assessed - Lower Body Bathing: Unsupported sit to stand Upper Body Dressing: Simulated;Minimal assistance Where Assessed - Upper Body Dressing: Unsupported sitting Lower Body Dressing: Performed;Supervision/safety Where Assessed - Lower Body Dressing: Unsupported sit to stand Toilet Transfer: Performed;Minimal assistance Toilet Transfer Method:  Sit to stand Toilet Transfer Equipment: Comfort height toilet Toileting - Clothing Manipulation and Hygiene: Simulated;Supervision/safety Where Assessed - Toileting Clothing Manipulation and Hygiene: Sit to stand from 3-in-1 or toilet Equipment Used: Rolling walker Transfers/Ambulation Related to ADLs: Pt overall S with ambulation in the room. ADL Comments: Pt only needed assist to educate pt to donn L arm into shirt first and to wear loose tshirts or button down shirts to avoid reaching over 90 degrees with L shoulder.    OT Diagnosis: Generalized weakness  OT Problem List: Decreased range of motion;Decreased knowledge of use of DME or AE;Impaired UE functional use OT Treatment Interventions: Self-care/ADL training;DME and/or AE instruction   OT Goals Acute Rehab OT Goals OT Goal Formulation: With patient Time For Goal Achievement: 09/11/12 Potential to Achieve Goals: Good ADL Goals Pt Will Perform Grooming: with modified independence;Standing at sink ADL Goal: Grooming - Progress: Goal set today Pt Will Perform Lower Body Bathing: with modified independence;Sit to stand from chair;Standing at sink;Sitting at sink ADL Goal: Lower Body Bathing - Progress: Goal set today Pt Will Perform Upper Body Dressing: with modified independence;Standing ADL Goal: Upper Body Dressing - Progress: Goal set today Pt Will Perform Lower Body Dressing: with modified independence;Sit to stand from bed ADL Goal: Lower Body Dressing - Progress: Goal set today Pt Will Perform Tub/Shower Transfer: Tub transfer;with supervision ADL Goal: Tub/Shower Transfer - Progress: Goal set today Additional ADL Goal #1: Pt will complete all aspects of toileting with 3:1 over commode with mod I. ADL Goal: Additional Goal #1 - Progress: Goal set today  Visit Information  Last OT Received On: 08/28/12 Assistance Needed: +1    Subjective Data  Subjective: I am not hurting as much  as I thought I would. Patient Stated  Goal: to get back home.   Prior Functioning  Vision/Perception  Home Living Lives With: Alone Available Help at Discharge: Personal care attendant Type of Home: House Home Access: Stairs to enter [employer] of Steps: 3 Entrance Stairs-Rails: Right Home Layout: One level Bathroom Shower/Tub: Engineer, manufacturing systems: Standard Bathroom Accessibility: No Home Adaptive Equipment: Straight cane;Walker - rolling Prior Function Level of Independence: Needs assistance Needs Assistance: Meal Prep;Light Housekeeping Meal Prep: Moderate Light Housekeeping: Maximal Able to Take Stairs?: Yes Driving: No Vocation: Retired Comments: aid is at his home 2 hours a day. Communication Communication: No difficulties Dominant Hand: Right   Vision - Assessment Vision Assessment: Vision not tested  Cognition  Overall Cognitive Status: Appears within functional limits for tasks assessed/performed Arousal/Alertness: Awake/alert Orientation Level: Oriented X4 / Intact Behavior During Session: [facility] for tasks performed Cognition - Other Comments: Daughter now takes care of paying all bills and pt no longer drives.    Extremity/Trunk Assessment Right Upper Extremity Assessment RUE ROM/Strength/Tone: WFL for tasks assessed RUE Sensation: WFL - Light Touch RUE Coordination: WFL - gross/fine motor Left Upper Extremity Assessment LUE ROM/Strength/Tone: Unable to fully assess LUE Sensation: WFL - Light Touch LUE Coordination: WFL - gross/fine motor Trunk Assessment Trunk Assessment: Normal   Mobility  Shoulder Instructions  Transfers Transfers: Sit to Stand;Stand to Sit Sit to Stand: 4: Min assist;From toilet Stand to Sit: 5: Supervision;Without upper extremity assist;To chair/3-in-1 Details for Transfer Assistance: Pt overall S with transfers but did have difficulty getting off toilet wo using rail.       Exercise     Balance Balance Balance Assessed: No   End of  Session OT - End of Session Activity Tolerance: Patient tolerated treatment well Patient left: in chair;with call bell/phone within reach Nurse Communication: Mobility status  GO     Hope Budds 08/28/2012, 10:12 AM [phone]

## 2012-08-28 NOTE — Discharge Summary (Signed)
  Discharge summary dictated on 08/28/2012 dictation number is [phone]

## 2012-08-28 NOTE — Progress Notes (Signed)
Per chart review, and discussion with pt rn pt recommended for home health pt with intermittent supervision. Pt plans to dc home with home health services and continued home health aid services. .No further Clinical Social Work needs, signing off.   Catha Gosselin, Theresia Majors  [phone] .08/28/2012 11:24am

## 2012-08-28 NOTE — Evaluation (Signed)
Physical Therapy Evaluation Patient Details Name: [name] [name] MRN: [id number] DOB: 08/11/1923 Today's Date: 08/28/2012 Time: 1478-2956 PT Time Calculation (min): 40 min  PT Assessment / Plan / Recommendation Clinical Impression  pt presents with Pacer implant.  pt generally deconditioned, but seems to have good awareness of safety and notes his daughter can help him daily at home if he needs her.      PT Assessment  Patient needs continued PT services    Follow Up Recommendations  Home health PT;Supervision - Intermittent    Barriers to Discharge Decreased caregiver support pt notes he is worried about being alone at nights and being able to feed his dog.  pt notes he keeps his gate locked so he is worried that if he called 911 they wouldn't be able to get in his gate.  Also his dog is outside only and he states the steps are steep to back yard and he feels that if he tried to go out to feed dog he might fall.      Equipment Recommendations  3 in 1 bedside comode    Recommendations for Other Services     Frequency Min 3X/week    Precautions / Restrictions Precautions Precautions: Fall;ICD/Pacemaker Restrictions Weight Bearing Restrictions: No   Pertinent Vitals/Pain Notes not painful, just feels weak in his shoulder.        Mobility  Bed Mobility Bed Mobility: Supine to Sit;Sitting - Scoot to Edge of Bed Supine to Sit: 6: Modified independent (Device/Increase time);With rails Sitting - Scoot to Edge of Bed: 6: Modified independent (Device/Increase time) Details for Bed Mobility Assistance: pt requires increased time to complete without A.   Transfers Transfers: Sit to Stand;Stand to Sit Sit to Stand: 4: Min assist;From toilet Stand to Sit: 5: Supervision;Without upper extremity assist;To chair/3-in-1 Details for Transfer Assistance: Pt overall S with transfers but did have difficulty getting off toilet wo using rail. Ambulation/Gait Ambulation/Gait Assistance: 5:  Supervision Ambulation Distance (Feet): 100 Feet Assistive device: Rolling walker Ambulation/Gait Assistance Details: cues for positioning in RW, negotiating obstacles.   Gait Pattern: Step-through pattern;Decreased stride length;Trunk flexed Stairs: No Wheelchair Mobility Wheelchair Mobility: No    Exercises     PT Diagnosis: Difficulty walking  PT Problem List: Decreased activity tolerance;Decreased balance;Decreased mobility;Decreased knowledge of use of DME PT Treatment Interventions: DME instruction;Gait training;Stair training;Functional mobility training;Therapeutic activities;Therapeutic exercise;Balance training;Patient/family education   PT Goals Acute Rehab PT Goals PT Goal Formulation: With patient Time For Goal Achievement: 09/11/12 Potential to Achieve Goals: Good Pt will go Supine/Side to Sit: Independently PT Goal: Supine/Side to Sit - Progress: Goal set today Pt will go Sit to Supine/Side: Independently PT Goal: Sit to Supine/Side - Progress: Goal set today Pt will go Sit to Stand: with modified independence PT Goal: Sit to Stand - Progress: Goal set today Pt will go Stand to Sit: with modified independence PT Goal: Stand to Sit - Progress: Goal set today Pt will Ambulate: >150 feet;with modified independence;with rolling walker PT Goal: Ambulate - Progress: Goal set today Pt will Go Up / Down Stairs: 3-5 stairs;with supervision;with rail(s) PT Goal: Up/Down Stairs - Progress: Goal set today  Visit Information  Last PT Received On: 08/28/12 Assistance Needed: +1    Subjective Data  Subjective: I'm not sure how I'll do after this surgery.   Patient Stated Goal: Home   Prior Functioning  Home Living Lives With: Alone Available Help at Discharge: Personal care attendant Type of Home: House Home Access: Stairs to enter  Entrance Stairs-Number of Steps: 3 Entrance Stairs-Rails: Right Home Layout: One level Bathroom Shower/Tub: Agricultural engineer: Standard Bathroom Accessibility: No Home Adaptive Equipment: Straight cane;Walker - rolling Prior Function Level of Independence: Needs assistance Needs Assistance: Meal Prep;Light Housekeeping Meal Prep: Moderate Light Housekeeping: Maximal Able to Take Stairs?: Yes Driving: No Vocation: Retired Comments: aid is at his home 2 hours a day. Communication Communication: No difficulties Dominant Hand: Right    Cognition  Overall Cognitive Status: Appears within functional limits for tasks assessed/performed Arousal/Alertness: Awake/alert Orientation Level: Oriented X4 / Intact Behavior During Session: WFL for tasks performed Cognition - Other Comments: Daughter now takes care of paying all bills and pt no longer drives.    Extremity/Trunk Assessment Right Upper Extremity Assessment RUE ROM/Strength/Tone: WFL for tasks assessed RUE Sensation: WFL - Light Touch RUE Coordination: WFL - gross/fine motor Left Upper Extremity Assessment LUE ROM/Strength/Tone: Unable to fully assess LUE Sensation: WFL - Light Touch LUE Coordination: WFL - gross/fine motor Right Lower Extremity Assessment RLE ROM/Strength/Tone: WFL for tasks assessed RLE Sensation: WFL - Light Touch Left Lower Extremity Assessment LLE ROM/Strength/Tone: WFL for tasks assessed LLE Sensation: WFL - Light Touch Trunk Assessment Trunk Assessment: Normal   Balance Balance Balance Assessed: No Dynamic Standing Balance Dynamic Standing - Balance Support: No upper extremity supported;During functional activity Dynamic Standing - Level of Assistance: 5: Stand by assistance Dynamic Standing - Comments: pt able to stand at sink to wash hands without A.    End of Session PT - End of Session Equipment Utilized During Treatment: Gait belt Activity Tolerance: Patient tolerated treatment well Patient left: in chair;with call bell/phone within reach Nurse Communication: Mobility status  GP     Sunny Schlein,  Flasher [phone] 08/28/2012, 10:34 AM

## 2012-08-29 NOTE — Discharge Summary (Signed)
[name], CHEEK NO.:  [id number]  MEDICAL RECORD NO.:  [id number]  LOCATION:  3W30C                        FACILITY:  [facility]  PHYSICIAN:  Chloey Ricard N. Sharyn Lull, M.D. DATE OF BIRTH:  Jan 08, 1923  DATE OF ADMISSION:  08/24/2012 DATE OF DISCHARGE:  08/28/2012                              DISCHARGE SUMMARY   ADMITTING DIAGNOSES: 1. Symptomatic bradycardia. 2. Atrial fibrillation with high-grade atrioventricular block in the     past with slow ventricular response and occasional pauses in the     past. 3. Status post atypical chest pain. 4. Mild coronary artery disease. 5. Nonischemic dilated cardiomyopathy. 6. Compensated systolic heart failure. 7. Hypertension. 8. Gastroesophageal reflux disease. 9. Hyperlipidemia. 10.History of subdural hematoma in the past.  DISCHARGE DIAGNOSES: 1. Status post symptomatic bradycardia. 2. Status was biventricular [employer] pacemaker. 3. Chronic atrial fibrillation with history of pauses and slow     ventricular response. 4. Status post atypical chest pain. 5. Nonischemic dilated cardiomyopathy. 6. Compensated systolic heart failure. 7. Hypertension. 8. Gastroesophageal reflux disease. 9. Hypercholesteremia. 10.History of subdural hematoma. 11.Status post nonsustained V-tach, 8 beats, asymptomatic.  DISCHARGE HOME MEDICATIONS: 1. Enteric-coated aspirin 81 mg 1 tablet daily. 2. Carvedilol 6.25 mg 1 tablet twice daily. 3. Ramipril 2.5 mg 1 capsule daily. 4. Aldactone 25 mg 1 tablet daily. 5. Atorvastatin 10 mg 1 tablet daily. 6. Isosorbide dinitrate 20 mg twice daily. 7. Multivitamin 1 tablet daily as before. 8. Flomax 0.4 mg 1 tablet daily as before.  The patient has been advised to stop furosemide, hydralazine, and potassium.  DIET:  Low salt, low cholesterol.  Permanent pacemaker insertion site care and instructions have been given.  Follow up with [facility] on September 08, 2012, at 11 a.m.  for wound check and follow with Dr. Hillis Range on December 11, 2012, at 9 a.m.  Follow up with me in 1 week.  CONDITION AT DISCHARGE:  Stable.  BRIEF HISTORY AND HOSPITAL COURSE:  [name] [name] is an 78 year old male with past medical history significant for mild coronary artery disease, nonischemic dilated cardiomyopathy, EF of approximately 30%, history of congestive heart failure secondary to systolic dysfunction, chronic atrial fibrillation, history of subdural hematoma evacuation in the past, degenerative joint disease, GERD, hyperlipidemia who came to the ER complaining of dizziness and lightheadedness while walking to the bathroom earlier this morning associated with feeling weak.  Also complains of vague epigastric and retrosternal chest pain, which got improved with GI cocktail.  While in ER, the patient was noted to have AFib with slow ventricular response, heart rate in 30s and upon ambulation also noted to have paroxysmal atrial fibrillation with slow ventricular response, heart rate in 30s associated with feeling dizziness.  The patient presently denies any chest pain.  Denies any history of syncope in the past.  Denies any palpitation.  Denies history of PND, orthopnea, or leg swelling.  Denies cough, fever, or chills.  He states he recently had surgery and was given some eye drops, but does not remember the name of the eye drops.  States has been seen using the eye drops for few weeks without any problems.  The patient is not on any  AV blocking medications.  PAST MEDICAL HISTORY:  As above.  PAST SURGICAL HISTORY:  He had evacuation of subdural hematoma in the past.  Had cataract surgery in the left eye.  Had hernia repair in the past.  Had brain surgery in the past.  Tonsillectomy many years ago. Had also vitrectomy in the past.  PHYSICAL EXAMINATION:  VITAL SIGNS:  His blood pressure was 152/97, pulse was 42. GENERAL:  He was afebrile. EYES:  Conjunctiva was  pink. NECK:  Supple.  No JVD.  No bruit. LUNGS:  Clear to auscultation without rhonchi or rales. CARDIOVASCULAR:  Irregularly irregular.  S1 and S2 was soft.  There was soft systolic murmur and diastolic murmur at left lower sternal border. EXTREMITIES:  There is no clubbing, cyanosis, or edema. NEUROLOGIC:  Grossly intact.  LABORATORY DATA:  Sodium was 140, potassium 3.2, which was has been replaced, potassium today is 4.3, BUN was 12, creatinine 1.01.  Three sets of cardiac enzymes were negative.  Cholesterol was 120, triglycerides 38, HDL was 49, LDL 63.  Hemoglobin was 15.1, hematocrit 43.6, white count of 3.0.  His TSH was normal at 0.870.  EKG showed atrial fibrillation with left axis deviation, septal infarct, age undetermined.  Nonspecific T-wave changes.  BRIEF HOSPITAL COURSE:  The patient was admitted to telemetry unit.  MI was ruled out by serial enzymes and EKG.  EP consultation was obtained with Dr. Graciela Husbands and Dr. Johney Frame.  The patient subsequently underwent placement of permanent pacemaker as per procedure report.  The patient tolerated the procedure well.  There were no complications.  I discussed with the patient and his daughter regarding skilled nursing facility versus discharging home with home health aide, OT/PT.  The patient wanted to be discharged home.  States he is able to take care of himself.  The patient is ambulating in hallway with assistance.  His pacemaker site is dry with no evidence of hematoma on discharge.  The patient remained afebrile during the hospital stay.  The patient will be discharged home on above medications and will be followed up in my office in 1 week and with [facility] EP as above.     Eduardo Osier. Sharyn Lull, M.D.     [facility]  D:  08/28/2012  T:  08/29/2012  Job:  [id number]

## 2012-09-08 ENCOUNTER — Ambulatory Visit (INDEPENDENT_AMBULATORY_CARE_PROVIDER_SITE_OTHER): Payer: Medicare Other | Admitting: *Deleted

## 2012-09-08 ENCOUNTER — Encounter: Payer: Self-pay | Admitting: Internal Medicine

## 2012-09-08 DIAGNOSIS — I428 Other cardiomyopathies: Secondary | ICD-10-CM

## 2012-09-08 DIAGNOSIS — I4891 Unspecified atrial fibrillation: Secondary | ICD-10-CM

## 2012-09-08 DIAGNOSIS — R001 Bradycardia, unspecified: Secondary | ICD-10-CM

## 2012-09-08 DIAGNOSIS — I498 Other specified cardiac arrhythmias: Secondary | ICD-10-CM

## 2012-09-08 LAB — PACEMAKER DEVICE OBSERVATION
BATTERY VOLTAGE: 3.0832 V
BMOD-0001RV: 2
BMOD-0002RV: 8
DEVICE MODEL PM: 7230364
RV LEAD IMPEDENCE PM: 575 Ohm
RV LEAD THRESHOLD: 0.75 V

## 2012-09-08 NOTE — Progress Notes (Signed)
WOUND CHECK PACER IN CLINIC

## 2012-10-03 ENCOUNTER — Other Ambulatory Visit: Payer: Self-pay | Admitting: Cardiology

## 2012-10-03 DIAGNOSIS — M549 Dorsalgia, unspecified: Secondary | ICD-10-CM

## 2012-10-08 ENCOUNTER — Ambulatory Visit
Admission: RE | Admit: 2012-10-08 | Discharge: 2012-10-08 | Disposition: A | Discharge status: Home / Self Care | Payer: Medicare Other | Source: Ambulatory Visit | Attending: Cardiology | Admitting: Cardiology

## 2012-10-08 VITALS — BP 97/51 | HR 52

## 2012-10-08 DIAGNOSIS — M549 Dorsalgia, unspecified: Secondary | ICD-10-CM

## 2012-10-08 MED ORDER — METHYLPREDNISOLONE ACETATE 40 MG/ML INJ SUSP (RADIOLOG
120.0000 mg | Freq: Once | INTRAMUSCULAR | Status: AC
  Administered 2012-10-08: 120 mg via EPIDURAL

## 2012-10-08 MED ORDER — IOHEXOL 180 MG/ML  SOLN
1.0000 mL | Freq: Once | INTRAMUSCULAR | Status: AC | PRN
  Administered 2012-10-08: 1 mL via EPIDURAL

## 2012-11-11 ENCOUNTER — Emergency Department (HOSPITAL_COMMUNITY): Payer: Medicare Other

## 2012-11-11 ENCOUNTER — Emergency Department (HOSPITAL_COMMUNITY)
Admission: EM | Admit: 2012-11-11 | Discharge: 2012-11-11 | Disposition: A | Discharge status: Home / Self Care | Payer: Medicare Other | Attending: Emergency Medicine | Admitting: Emergency Medicine

## 2012-11-11 ENCOUNTER — Encounter (HOSPITAL_COMMUNITY): Payer: Self-pay | Admitting: *Deleted

## 2012-11-11 DIAGNOSIS — I10 Essential (primary) hypertension: Secondary | ICD-10-CM | POA: Insufficient documentation

## 2012-11-11 DIAGNOSIS — I714 Abdominal aortic aneurysm, without rupture, unspecified: Secondary | ICD-10-CM

## 2012-11-11 DIAGNOSIS — F3289 Other specified depressive episodes: Secondary | ICD-10-CM | POA: Insufficient documentation

## 2012-11-11 DIAGNOSIS — Z87891 Personal history of nicotine dependence: Secondary | ICD-10-CM | POA: Insufficient documentation

## 2012-11-11 DIAGNOSIS — K5792 Diverticulitis of intestine, part unspecified, without perforation or abscess without bleeding: Secondary | ICD-10-CM

## 2012-11-11 DIAGNOSIS — Z8679 Personal history of other diseases of the circulatory system: Secondary | ICD-10-CM | POA: Insufficient documentation

## 2012-11-11 DIAGNOSIS — K5900 Constipation, unspecified: Secondary | ICD-10-CM | POA: Insufficient documentation

## 2012-11-11 DIAGNOSIS — Z7982 Long term (current) use of aspirin: Secondary | ICD-10-CM | POA: Insufficient documentation

## 2012-11-11 DIAGNOSIS — I4891 Unspecified atrial fibrillation: Secondary | ICD-10-CM | POA: Insufficient documentation

## 2012-11-11 DIAGNOSIS — K5732 Diverticulitis of large intestine without perforation or abscess without bleeding: Secondary | ICD-10-CM | POA: Insufficient documentation

## 2012-11-11 DIAGNOSIS — Z79899 Other long term (current) drug therapy: Secondary | ICD-10-CM | POA: Insufficient documentation

## 2012-11-11 DIAGNOSIS — I509 Heart failure, unspecified: Secondary | ICD-10-CM | POA: Insufficient documentation

## 2012-11-11 DIAGNOSIS — K219 Gastro-esophageal reflux disease without esophagitis: Secondary | ICD-10-CM | POA: Insufficient documentation

## 2012-11-11 DIAGNOSIS — F329 Major depressive disorder, single episode, unspecified: Secondary | ICD-10-CM | POA: Insufficient documentation

## 2012-11-11 DIAGNOSIS — Z8709 Personal history of other diseases of the respiratory system: Secondary | ICD-10-CM | POA: Insufficient documentation

## 2012-11-11 DIAGNOSIS — M129 Arthropathy, unspecified: Secondary | ICD-10-CM | POA: Insufficient documentation

## 2012-11-11 LAB — CBC WITH DIFFERENTIAL/PLATELET
Basophils Absolute: 0 10*3/uL (ref 0.0–0.1)
Eosinophils Relative: 5 % (ref 0–5)
Lymphocytes Relative: 43 % (ref 12–46)
Neutro Abs: 1.9 10*3/uL (ref 1.7–7.7)
Neutrophils Relative %: 43 % (ref 43–77)
Platelets: 178 10*3/uL (ref 150–400)
RDW: 16 % — ABNORMAL HIGH (ref 11.5–15.5)
WBC: 4.4 10*3/uL (ref 4.0–10.5)

## 2012-11-11 LAB — COMPREHENSIVE METABOLIC PANEL
ALT: 12 U/L (ref 0–53)
AST: 20 U/L (ref 0–37)
CO2: 24 mEq/L (ref 19–32)
Calcium: 9.1 mg/dL (ref 8.4–10.5)
Chloride: 101 mEq/L (ref 96–112)
GFR calc non Af Amer: 49 mL/min — ABNORMAL LOW (ref >90)
Potassium: 3 mEq/L — ABNORMAL LOW (ref 3.5–5.1)
Sodium: 137 mEq/L (ref 135–145)

## 2012-11-11 MED ORDER — IOHEXOL 350 MG/ML SOLN
100.0000 mL | Freq: Once | INTRAVENOUS | Status: AC | PRN
  Administered 2012-11-11: 100 mL via INTRAVENOUS

## 2012-11-11 MED ORDER — CIPROFLOXACIN HCL 500 MG PO TABS
500.0000 mg | ORAL_TABLET | Freq: Once | ORAL | Status: AC
  Administered 2012-11-11: 500 mg via ORAL
  Filled 2012-11-11: qty 1

## 2012-11-11 MED ORDER — METRONIDAZOLE 500 MG PO TABS
500.0000 mg | ORAL_TABLET | Freq: Once | ORAL | Status: AC
  Administered 2012-11-11: 500 mg via ORAL
  Filled 2012-11-11: qty 1

## 2012-11-11 MED ORDER — CIPROFLOXACIN HCL 500 MG PO TABS: 500.0000 mg | ORAL_TABLET | Freq: Two times a day (BID) | ORAL | Status: DC

## 2012-11-11 MED ORDER — ONDANSETRON 8 MG PO TBDP: 8.0000 mg | ORAL_TABLET | Freq: Three times a day (TID) | ORAL | Status: DC | PRN

## 2012-11-11 MED ORDER — GI COCKTAIL ~~LOC~~
30.0000 mL | Freq: Once | ORAL | Status: AC
  Administered 2012-11-11: 30 mL via ORAL
  Filled 2012-11-11: qty 30

## 2012-11-11 MED ORDER — METRONIDAZOLE 500 MG PO TABS: 500.0000 mg | ORAL_TABLET | Freq: Two times a day (BID) | ORAL | Status: DC

## 2012-11-11 MED ORDER — HYDROCODONE-ACETAMINOPHEN 5-325 MG PO TABS: 1.0000 | ORAL_TABLET | ORAL | Status: DC | PRN

## 2012-11-11 NOTE — ED Provider Notes (Signed)
8:45 AM Appears to have 4cm x 3.8 cm AAA on bedside US. Will send to CT scan to evaluate further at this time. Vitals stable  Lyanne Co, MD 11/11/12 [phone]

## 2012-11-11 NOTE — ED Notes (Signed)
Complains of abd pain, constipation and weakness x 3 days

## 2012-11-11 NOTE — ED Notes (Signed)
Patient transported to CT 

## 2012-11-11 NOTE — ED Notes (Signed)
Patient calling for ride home

## 2012-11-11 NOTE — ED Provider Notes (Signed)
Medical screening examination/treatment/procedure(s) were conducted as a shared visit with non-physician practitioner(s) and myself.  I personally evaluated the patient during the encounter  11:47 AM The patient feels much better at this time.  He has some stranding around his colon to suggest diverticulitis.  I do not think this is a problem currently with his abdominal aneurysm.  He has never seen a vascular surgeon.  He's been referred to the vascular surgeon for close followup regarding his abdominal aneurysm.  He'll be treated with ciprofloxacin and Flagyl for his diverticulitis.  I told the patient to return to the ER for any new or worsening symptoms.  I offered to call his daughters inform them of the outpatient plan but he states he cares for himself and he does not need any assistance.  At this time his vital signs are normal I do not think the patient needs admission.  This is uncomplicated diverticulitis.  1. Diverticulitis   2. AAA (abdominal aortic aneurysm)    Ct Angio Chest W/cm &/or Wo Cm  11/11/2012  *RADIOLOGY REPORT*  Clinical Data:  Abdominal pain.  History of thoracic aortic ulcer. Evaluate for aortic dissection.  Possible contained rupture.  CT ANGIOGRAPHY CHEST, ABDOMEN AND PELVIS  Technique:  Multidetector CT imaging through the chest, abdomen and pelvis was performed using the standard protocol during bolus administration of intravenous contrast.  Multiplanar reconstructed images including MIPs were obtained and reviewed to evaluate the vascular anatomy.  Contrast: OMNIPAQUE IOHEXOL 350 MG/ML SOLN  Comparison:  CT of the abdomen and pelvis 04/05/2012.  CTA CHEST  Findings:  Mediastinum: On precontrast images there is no crescentic high attenuation associated with the wall of the thoracic aorta to suggest an acute intramural hemorrhage.  On postcontrast images, there is extensive atherosclerosis of the thoracic aorta, predominately involving the arch and descending thoracic  aorta, but there is no evidence of aneurysm or dissection.  The area of the previously suspected penetrating ulcer in the ascending thoracic aorta appears to represent just a prominent atherosclerotic plaque (no penetrating ulcer identified).  Mild ectasia of the descending thoracic aorta is noted measuring up to 3.8 cm in diameter. Atherosclerotic calcifications are noted in the left main, left anterior descending, left circumflex and right coronary arteries. Heart size is mildly enlarged. There is no significant pericardial fluid, thickening or pericardial calcification. No pathologically enlarged mediastinal or hilar lymph nodes. Esophagus is unremarkable in appearance.   Pacemaker lead terminates in the right ventricular apex.  Lungs/Pleura: Mild diffuse bronchial wall thickening.  Mild centrilobular and paraseptal emphysema.  Calcified right upper lobe nodule immediately above the minor fissure is compatible with a calcified granuloma.  No other suspicious appearing pulmonary nodules or masses are identified on today's examination.  Small amount of scarring in the right lower lobe.  No consolidative airspace disease.  No pleural effusions.  Musculoskeletal: There are no aggressive appearing lytic or blastic lesions noted in the visualized portions of the skeleton.   Review of the MIP images confirms the above findings.  IMPRESSION: 1.  No acute abnormality of the thoracic aorta. 2. Atherosclerosis, including left main and three-vessel coronary artery disease. 3.  Mild diffuse bronchial wall thickening with mild centrilobular and paraseptal emphysema; imaging findings compatible with mild COPD. 4.  Additional incidental findings, as above.  CTA ABDOMEN AND PELVIS  Findings:  Abdomen/Pelvis:  Extensive atherosclerosis of the abdominal and pelvic vasculature, including a fusiform infrarenal abdominal aortic aneurysm measuring up to 4.3 x 4.6 cm.  There is  some eccentric nonenhancing material associated with the  aneurysm sac, which may represent a combination of atheromatous plaque and/or mural thrombus.  No evidence of surrounding active extravasation to suggest a leaking aneurysm at this time.  There is also aneurysmal dilatation of the left common iliac artery (2.1 cm in diameter). The celiac axis, superior mesenteric artery and inferior mesenteric artery are all patent at this time, as are their major branches. Bilateral renal arteries are patent with mild atherosclerosis.  The liver appears slightly shrunken and nodular in contour, which could suggest some mild cirrhosis.  No definite focal hepatic lesions are identified.  The appearance of the gallbladder, pancreas, spleen and bilateral adrenal glands is unremarkable. Numerous low-attenuation lesions are again noted in the kidneys bilaterally, the largest of which are compatible with simple cysts, with the smaller lesions too small to definitively characterize. These are similar to prior study from 04/05/2012.  No ascites or pneumoperitoneum and a no pathologic distension of small bowel. There are few scattered colonic diverticula, and adjacent to the distal descending colon there are some subtle inflammatory changes that are concerning for early or mild acute diverticulitis.  This is best demonstrated on images 217 - 231 of series 6.  No ascites or pneumoperitoneum and no pathologic distension of small bowel. No definite pathologic lymphadenopathy identified within the abdomen or pelvis at this time.  Prostate gland is enlarged (7.6 x 5.8 cm).  Urinary bladder is unremarkable in appearance.  Musculoskeletal: Bones are osteopenic. There are no aggressive appearing lytic or blastic lesions noted in the visualized portions of the skeleton.   Review of the MIP images confirms the above findings.  IMPRESSION: 1.  Soft tissue stranding adjacent to the distal descending colon. Given the presence of a few scattered colonic diverticula, these findings are concerning for early  or mild acute diverticulitis. Clinical correlation is recommended.  No definite diverticular abscess or signs to suggest a frank perforation are noted at this time. 2.  There is also a fusiform infrarenal abdominal aortic aneurysm measuring 4.3 x 4.6 cm.  This contains some internal atheromatous plaque and/or mural thrombus.  No evidence of active extravasation at this time. 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 3.  Normal appendix. 4.  The appearance of the liver could suggest mild cirrhosis, as above. 5.  Numerous low attenuation renal lesions redemonstrated.  The largest of these are compatible with simple cysts, while the smaller lesions are too small to definitively characterize. 6.  Prostatomegaly. 7.  Additional incidental findings, as above.   Original Report Authenticated By: Trudie Reed, M.D.    Ct Cta Abd/pel W/cm &/or W/o Cm  11/11/2012  *RADIOLOGY REPORT*  Clinical Data:  Abdominal pain.  History of thoracic aortic ulcer. Evaluate for aortic dissection.  Possible contained rupture.  CT ANGIOGRAPHY CHEST, ABDOMEN AND PELVIS  Technique:  Multidetector CT imaging through the chest, abdomen and pelvis was performed using the standard protocol during bolus administration of intravenous contrast.  Multiplanar reconstructed images including MIPs were obtained and reviewed to evaluate the vascular anatomy.  Contrast: OMNIPAQUE IOHEXOL 350 MG/ML SOLN  Comparison:  CT of the abdomen and pelvis 04/05/2012.  CTA CHEST  Findings:  Mediastinum: On precontrast images there is no crescentic high attenuation associated with the wall of the thoracic aorta to suggest an acute intramural hemorrhage.  On postcontrast images, there is extensive atherosclerosis of the thoracic aorta, predominately involving the arch and descending thoracic aorta, but there is no  evidence of aneurysm or dissection.  The area of the previously suspected penetrating ulcer in the ascending thoracic aorta appears  to represent just a prominent atherosclerotic plaque (no penetrating ulcer identified).  Mild ectasia of the descending thoracic aorta is noted measuring up to 3.8 cm in diameter. Atherosclerotic calcifications are noted in the left main, left anterior descending, left circumflex and right coronary arteries. Heart size is mildly enlarged. There is no significant pericardial fluid, thickening or pericardial calcification. No pathologically enlarged mediastinal or hilar lymph nodes. Esophagus is unremarkable in appearance.   Pacemaker lead terminates in the right ventricular apex.  Lungs/Pleura: Mild diffuse bronchial wall thickening.  Mild centrilobular and paraseptal emphysema.  Calcified right upper lobe nodule immediately above the minor fissure is compatible with a calcified granuloma.  No other suspicious appearing pulmonary nodules or masses are identified on today's examination.  Small amount of scarring in the right lower lobe.  No consolidative airspace disease.  No pleural effusions.  Musculoskeletal: There are no aggressive appearing lytic or blastic lesions noted in the visualized portions of the skeleton.   Review of the MIP images confirms the above findings.  IMPRESSION: 1.  No acute abnormality of the thoracic aorta. 2. Atherosclerosis, including left main and three-vessel coronary artery disease. 3.  Mild diffuse bronchial wall thickening with mild centrilobular and paraseptal emphysema; imaging findings compatible with mild COPD. 4.  Additional incidental findings, as above.  CTA ABDOMEN AND PELVIS  Findings:  Abdomen/Pelvis:  Extensive atherosclerosis of the abdominal and pelvic vasculature, including a fusiform infrarenal abdominal aortic aneurysm measuring up to 4.3 x 4.6 cm.  There is some eccentric nonenhancing material associated with the aneurysm sac, which may represent a combination of atheromatous plaque and/or mural thrombus.  No evidence of surrounding active extravasation to suggest a  leaking aneurysm at this time.  There is also aneurysmal dilatation of the left common iliac artery (2.1 cm in diameter). The celiac axis, superior mesenteric artery and inferior mesenteric artery are all patent at this time, as are their major branches. Bilateral renal arteries are patent with mild atherosclerosis.  The liver appears slightly shrunken and nodular in contour, which could suggest some mild cirrhosis.  No definite focal hepatic lesions are identified.  The appearance of the gallbladder, pancreas, spleen and bilateral adrenal glands is unremarkable. Numerous low-attenuation lesions are again noted in the kidneys bilaterally, the largest of which are compatible with simple cysts, with the smaller lesions too small to definitively characterize. These are similar to prior study from 04/05/2012.  No ascites or pneumoperitoneum and a no pathologic distension of small bowel. There are few scattered colonic diverticula, and adjacent to the distal descending colon there are some subtle inflammatory changes that are concerning for early or mild acute diverticulitis.  This is best demonstrated on images 217 - 231 of series 6.  No ascites or pneumoperitoneum and no pathologic distension of small bowel. No definite pathologic lymphadenopathy identified within the abdomen or pelvis at this time.  Prostate gland is enlarged (7.6 x 5.8 cm).  Urinary bladder is unremarkable in appearance.  Musculoskeletal: Bones are osteopenic. There are no aggressive appearing lytic or blastic lesions noted in the visualized portions of the skeleton.   Review of the MIP images confirms the above findings.  IMPRESSION: 1.  Soft tissue stranding adjacent to the distal descending colon. Given the presence of a few scattered colonic diverticula, these findings are concerning for early or mild acute diverticulitis. Clinical correlation is recommended.  No  definite diverticular abscess or signs to suggest a frank perforation are noted at  this time. 2.  There is also a fusiform infrarenal abdominal aortic aneurysm measuring 4.3 x 4.6 cm.  This contains some internal atheromatous plaque and/or mural thrombus.  No evidence of active extravasation at this time. 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 3.  Normal appendix. 4.  The appearance of the liver could suggest mild cirrhosis, as above. 5.  Numerous low attenuation renal lesions redemonstrated.  The largest of these are compatible with simple cysts, while the smaller lesions are too small to definitively characterize. 6.  Prostatomegaly. 7.  Additional incidental findings, as above.   Original Report Authenticated By: Trudie Reed, M.D.   I personally reviewed the imaging tests through PACS system I reviewed available ER/hospitalization records through the EMR   Lyanne Co, MD 11/11/12 1148

## 2012-11-11 NOTE — ED Provider Notes (Signed)
History     CSN: [id number]  Arrival date & time 11/11/12  0865   First MD Initiated Contact with Patient 11/11/12 [phone]      Chief Complaint  Patient presents with  . Abdominal Pain    (Consider location/radiation/quality/duration/timing/severity/associated sxs/prior treatment) HPI  78 year old male with history of A. fib, GERD, and cardiomyopathy presents complaining of abdominal pain. Patient reports for the past 3 days he has gradual onset of epigastric abdominal pain. Pain is described as a burning sensation,waxing and waning, moderate in severity, worsened after eating, feels similar to his GERD. Pain worsening when he lays down.  Has tried TUMS and mylanta without adequate relief.   Denies fever, chills, sneeze, runny nose,cough, hemoptysis,  cp, sob, back pain, dysuria, hematochezia, melena, or rash.  Sts he has been to ER for the same complaint in the past and GI cocktail usually helps with his pain.  Pt also mentioned he has trouble with constipation and his having to use laxative to move his bowel.  Last BM yesterday, and had several bouts.  Denies taking narcotic pain meds.  Denies recent alcohol use. Denies dizziness or lightheadedness.   Past Medical History  Diagnosis Date  . Hypertension   . CHF (congestive heart failure)   . GERD (gastroesophageal reflux disease)   . Recurrent upper respiratory infection (URI)     recent cold- treating /w OTC  . Arthritis     "legs are stiff" , had spinal injections for pain relieve 3-4 yrs. ago  . Atrial fibrillation-permanent   . Constipation   . Depression   . Nonischemic/ ischemic cardiomyopathy     Single-vessel coronary disease catheterization 2004-Myoview showing inferior wall perfusion defect Myoview 2006  . Subdural hematoma     Prior evacuation  . Cataract   . Bradycardia     Past Surgical History  Procedure Date  . Evacuation of subdural hematoma   . Eye surgery     cataract extracted in L eye  . Hernia repair       R side inguinal repair   . Brain surgery     2002  . Tonsillectomy     1957  . Cataract extraction w/phaco 01/30/2012    Procedure: CATARACT EXTRACTION PHACO AND INTRAOCULAR LENS PLACEMENT (IOC);  Surgeon: Shade Flood, MD;  Location: [facility] OR;  Service: Ophthalmology;  Laterality: Right;  . Pars plana vitrectomy 07/30/2012    Procedure: PARS PLANA VITRECTOMY WITH 23 GAUGE;  Surgeon: Shade Flood, MD;  Location: [facility] OR;  Service: Ophthalmology;  Laterality: Right;  Insertion of Silicone Oil  . Gas/fluid exchange 07/30/2012    Procedure: GAS/FLUID EXCHANGE;  Surgeon: Shade Flood, MD;  Location: [facility] OR;  Service: Ophthalmology;  Laterality: Right;    Family History  Problem Relation Age of Onset  . Anesthesia problems Neg Hx   . Hypotension Neg Hx   . Malignant hyperthermia Neg Hx   . Pseudochol deficiency Neg Hx     History  Substance Use Topics  . Smoking status: Former Smoker -- 0.2 packs/day for 15 years    Quit date: 12/24/1994  . Smokeless tobacco: Not on file  . Alcohol Use: No      Review of Systems  All other systems reviewed and are negative.    Allergies  Review of patient's allergies indicates no known allergies.  Home Medications   Current Outpatient Rx  Name  Route  Sig  Dispense  Refill  . ASPIRIN 81 MG PO TBEC   Oral  Take 1 tablet (81 mg total) by mouth daily.   30 tablet   3   . ATORVASTATIN CALCIUM 10 MG PO TABS   Oral   Take 10 mg by mouth daily.          Marland Kitchen CARVEDILOL 6.25 MG PO TABS   Oral   Take 1 tablet (6.25 mg total) by mouth 2 (two) times daily with a meal.   60 tablet   3   . ISOSORBIDE DINITRATE 20 MG PO TABS   Oral   Take 20 mg by mouth 2 (two) times daily.         . ADULT MULTIVITAMIN W/MINERALS CH   Oral   Take 1 tablet by mouth daily.         Marland Kitchen RAMIPRIL 2.5 MG PO CAPS   Oral   Take 1 capsule (2.5 mg total) by mouth daily.   30 capsule   3   . SPIRONOLACTONE 25 MG PO TABS   Oral   Take 1 tablet (25 mg total) by  mouth daily.   30 tablet   3   . TAMSULOSIN HCL 0.4 MG PO CAPS   Oral   Take 0.4 mg by mouth daily.            BP 117/74  Pulse 77  Temp 98.3 F (36.8 C) (Oral)  Resp 18  SpO2 97%  Physical Exam  Nursing note and vitals reviewed. Constitutional: He appears well-developed and well-nourished. No distress.       Awake, alert, nontoxic appearance  HENT:  Head: Atraumatic.  Eyes: Conjunctivae normal are normal. Right eye exhibits no discharge. Left eye exhibits no discharge.  Neck: Normal range of motion. Neck supple.  Cardiovascular:       irregularly irregular  Pulmonary/Chest: Effort normal. No respiratory distress. He exhibits no tenderness.  Abdominal: Soft. Bowel sounds are normal. He exhibits no distension. There is tenderness (mild epigastric tenderness without guarding or rebound.  no abd hernia, or overlying skin changes.  ). There is no rebound.       Palpable abdominal aorta on exam  Musculoskeletal: He exhibits no edema and no tenderness.       ROM appears intact, no obvious focal weakness  Neurological: He is alert.  Skin: Skin is warm and dry. No rash noted.  Psychiatric: He has a normal mood and affect.    ED Course  Procedures (including critical care time)  Labs Reviewed - No data to display No results found.   No diagnosis found.   Date: 11/11/2012  Rate: 62  Rhythm: atrial fibrillation  QRS Axis: left  Intervals: v-paced complexes  ST/T Wave abnormalities: v-paced complexes  Conduction Disutrbances:v-paced complexes  Narrative Interpretation:   Old EKG Reviewed: changes noted  Results for orders placed during the hospital encounter of 11/11/12  CBC WITH DIFFERENTIAL      Component Value Range   WBC 4.4  4.0 - 10.5 K/uL   RBC 5.16  4.22 - 5.81 MIL/uL   Hemoglobin 13.2  13.0 - 17.0 g/dL   HCT 40.9 (*) 81.1 - 91.4 %   MCV 72.9 (*) 78.0 - 100.0 fL   MCH 25.6 (*) 26.0 - 34.0 pg   MCHC 35.1  30.0 - 36.0 g/dL   RDW 78.2 (*) 95.6 - 21.3 %    Platelets 178  150 - 400 K/uL   Neutrophils Relative 43  43 - 77 %   Neutro Abs 1.9  1.7 - 7.7 K/uL  Lymphocytes Relative 43  12 - 46 %   Lymphs Abs 1.9  0.7 - 4.0 K/uL   Monocytes Relative 9  3 - 12 %   Monocytes Absolute 0.4  0.1 - 1.0 K/uL   Eosinophils Relative 5  0 - 5 %   Eosinophils Absolute 0.2  0.0 - 0.7 K/uL   Basophils Relative 1  0 - 1 %   Basophils Absolute 0.0  0.0 - 0.1 K/uL  COMPREHENSIVE METABOLIC PANEL      Component Value Range   Sodium 137  135 - 145 mEq/L   Potassium 3.0 (*) 3.5 - 5.1 mEq/L   Chloride 101  96 - 112 mEq/L   CO2 24  19 - 32 mEq/L   Glucose, Bld 96  70 - 99 mg/dL   BUN 18  6 - 23 mg/dL   Creatinine, Ser 9.60  0.50 - 1.35 mg/dL   Calcium 9.1  8.4 - 45.4 mg/dL   Total Protein 6.7  6.0 - 8.3 g/dL   Albumin 3.4 (*) 3.5 - 5.2 g/dL   AST 20  0 - 37 U/L   ALT 12  0 - 53 U/L   Alkaline Phosphatase 64  39 - 117 U/L   Total Bilirubin 0.8  0.3 - 1.2 mg/dL   GFR calc non Af Amer 49 (*) >90 mL/min   GFR calc Af Amer 57 (*) >90 mL/min  LIPASE, BLOOD      Component Value Range   Lipase 23  11 - 59 U/L   Ct Angio Chest W/cm &/or Wo Cm  11/11/2012  *RADIOLOGY REPORT*  Clinical Data:  Abdominal pain.  History of thoracic aortic ulcer. Evaluate for aortic dissection.  Possible contained rupture.  CT ANGIOGRAPHY CHEST, ABDOMEN AND PELVIS  Technique:  Multidetector CT imaging through the chest, abdomen and pelvis was performed using the standard protocol during bolus administration of intravenous contrast.  Multiplanar reconstructed images including MIPs were obtained and reviewed to evaluate the vascular anatomy.  Contrast: OMNIPAQUE IOHEXOL 350 MG/ML SOLN  Comparison:  CT of the abdomen and pelvis 04/05/2012.  CTA CHEST  Findings:  Mediastinum: On precontrast images there is no crescentic high attenuation associated with the wall of the thoracic aorta to suggest an acute intramural hemorrhage.  On postcontrast images, there is extensive atherosclerosis of the  thoracic aorta, predominately involving the arch and descending thoracic aorta, but there is no evidence of aneurysm or dissection.  The area of the previously suspected penetrating ulcer in the ascending thoracic aorta appears to represent just a prominent atherosclerotic plaque (no penetrating ulcer identified).  Mild ectasia of the descending thoracic aorta is noted measuring up to 3.8 cm in diameter. Atherosclerotic calcifications are noted in the left main, left anterior descending, left circumflex and right coronary arteries. Heart size is mildly enlarged. There is no significant pericardial fluid, thickening or pericardial calcification. No pathologically enlarged mediastinal or hilar lymph nodes. Esophagus is unremarkable in appearance.   Pacemaker lead terminates in the right ventricular apex.  Lungs/Pleura: Mild diffuse bronchial wall thickening.  Mild centrilobular and paraseptal emphysema.  Calcified right upper lobe nodule immediately above the minor fissure is compatible with a calcified granuloma.  No other suspicious appearing pulmonary nodules or masses are identified on today's examination.  Small amount of scarring in the right lower lobe.  No consolidative airspace disease.  No pleural effusions.  Musculoskeletal: There are no aggressive appearing lytic or blastic lesions noted in the visualized portions of the skeleton.  Review of the MIP images confirms the above findings.  IMPRESSION: 1.  No acute abnormality of the thoracic aorta. 2. Atherosclerosis, including left main and three-vessel coronary artery disease. 3.  Mild diffuse bronchial wall thickening with mild centrilobular and paraseptal emphysema; imaging findings compatible with mild COPD. 4.  Additional incidental findings, as above.  CTA ABDOMEN AND PELVIS  Findings:  Abdomen/Pelvis:  Extensive atherosclerosis of the abdominal and pelvic vasculature, including a fusiform infrarenal abdominal aortic aneurysm measuring up to 4.3 x 4.6  cm.  There is some eccentric nonenhancing material associated with the aneurysm sac, which may represent a combination of atheromatous plaque and/or mural thrombus.  No evidence of surrounding active extravasation to suggest a leaking aneurysm at this time.  There is also aneurysmal dilatation of the left common iliac artery (2.1 cm in diameter). The celiac axis, superior mesenteric artery and inferior mesenteric artery are all patent at this time, as are their major branches. Bilateral renal arteries are patent with mild atherosclerosis.  The liver appears slightly shrunken and nodular in contour, which could suggest some mild cirrhosis.  No definite focal hepatic lesions are identified.  The appearance of the gallbladder, pancreas, spleen and bilateral adrenal glands is unremarkable. Numerous low-attenuation lesions are again noted in the kidneys bilaterally, the largest of which are compatible with simple cysts, with the smaller lesions too small to definitively characterize. These are similar to prior study from 04/05/2012.  No ascites or pneumoperitoneum and a no pathologic distension of small bowel. There are few scattered colonic diverticula, and adjacent to the distal descending colon there are some subtle inflammatory changes that are concerning for early or mild acute diverticulitis.  This is best demonstrated on images 217 - 231 of series 6.  No ascites or pneumoperitoneum and no pathologic distension of small bowel. No definite pathologic lymphadenopathy identified within the abdomen or pelvis at this time.  Prostate gland is enlarged (7.6 x 5.8 cm).  Urinary bladder is unremarkable in appearance.  Musculoskeletal: Bones are osteopenic. There are no aggressive appearing lytic or blastic lesions noted in the visualized portions of the skeleton.   Review of the MIP images confirms the above findings.  IMPRESSION: 1.  Soft tissue stranding adjacent to the distal descending colon. Given the presence of a few  scattered colonic diverticula, these findings are concerning for early or mild acute diverticulitis. Clinical correlation is recommended.  No definite diverticular abscess or signs to suggest a frank perforation are noted at this time. 2.  There is also a fusiform infrarenal abdominal aortic aneurysm measuring 4.3 x 4.6 cm.  This contains some internal atheromatous plaque and/or mural thrombus.  No evidence of active extravasation at this time. 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 3.  Normal appendix. 4.  The appearance of the liver could suggest mild cirrhosis, as above. 5.  Numerous low attenuation renal lesions redemonstrated.  The largest of these are compatible with simple cysts, while the smaller lesions are too small to definitively characterize. 6.  Prostatomegaly. 7.  Additional incidental findings, as above.   Original Report Authenticated By: Trudie Reed, M.D.    Ct Cta Abd/pel W/cm &/or W/o Cm  11/11/2012  *RADIOLOGY REPORT*  Clinical Data:  Abdominal pain.  History of thoracic aortic ulcer. Evaluate for aortic dissection.  Possible contained rupture.  CT ANGIOGRAPHY CHEST, ABDOMEN AND PELVIS  Technique:  Multidetector CT imaging through the chest, abdomen and pelvis was performed using the standard protocol during bolus  administration of intravenous contrast.  Multiplanar reconstructed images including MIPs were obtained and reviewed to evaluate the vascular anatomy.  Contrast: OMNIPAQUE IOHEXOL 350 MG/ML SOLN  Comparison:  CT of the abdomen and pelvis 04/05/2012.  CTA CHEST  Findings:  Mediastinum: On precontrast images there is no crescentic high attenuation associated with the wall of the thoracic aorta to suggest an acute intramural hemorrhage.  On postcontrast images, there is extensive atherosclerosis of the thoracic aorta, predominately involving the arch and descending thoracic aorta, but there is no evidence of aneurysm or dissection.  The area of the  previously suspected penetrating ulcer in the ascending thoracic aorta appears to represent just a prominent atherosclerotic plaque (no penetrating ulcer identified).  Mild ectasia of the descending thoracic aorta is noted measuring up to 3.8 cm in diameter. Atherosclerotic calcifications are noted in the left main, left anterior descending, left circumflex and right coronary arteries. Heart size is mildly enlarged. There is no significant pericardial fluid, thickening or pericardial calcification. No pathologically enlarged mediastinal or hilar lymph nodes. Esophagus is unremarkable in appearance.   Pacemaker lead terminates in the right ventricular apex.  Lungs/Pleura: Mild diffuse bronchial wall thickening.  Mild centrilobular and paraseptal emphysema.  Calcified right upper lobe nodule immediately above the minor fissure is compatible with a calcified granuloma.  No other suspicious appearing pulmonary nodules or masses are identified on today's examination.  Small amount of scarring in the right lower lobe.  No consolidative airspace disease.  No pleural effusions.  Musculoskeletal: There are no aggressive appearing lytic or blastic lesions noted in the visualized portions of the skeleton.   Review of the MIP images confirms the above findings.  IMPRESSION: 1.  No acute abnormality of the thoracic aorta. 2. Atherosclerosis, including left main and three-vessel coronary artery disease. 3.  Mild diffuse bronchial wall thickening with mild centrilobular and paraseptal emphysema; imaging findings compatible with mild COPD. 4.  Additional incidental findings, as above.  CTA ABDOMEN AND PELVIS  Findings:  Abdomen/Pelvis:  Extensive atherosclerosis of the abdominal and pelvic vasculature, including a fusiform infrarenal abdominal aortic aneurysm measuring up to 4.3 x 4.6 cm.  There is some eccentric nonenhancing material associated with the aneurysm sac, which may represent a combination of atheromatous plaque and/or  mural thrombus.  No evidence of surrounding active extravasation to suggest a leaking aneurysm at this time.  There is also aneurysmal dilatation of the left common iliac artery (2.1 cm in diameter). The celiac axis, superior mesenteric artery and inferior mesenteric artery are all patent at this time, as are their major branches. Bilateral renal arteries are patent with mild atherosclerosis.  The liver appears slightly shrunken and nodular in contour, which could suggest some mild cirrhosis.  No definite focal hepatic lesions are identified.  The appearance of the gallbladder, pancreas, spleen and bilateral adrenal glands is unremarkable. Numerous low-attenuation lesions are again noted in the kidneys bilaterally, the largest of which are compatible with simple cysts, with the smaller lesions too small to definitively characterize. These are similar to prior study from 04/05/2012.  No ascites or pneumoperitoneum and a no pathologic distension of small bowel. There are few scattered colonic diverticula, and adjacent to the distal descending colon there are some subtle inflammatory changes that are concerning for early or mild acute diverticulitis.  This is best demonstrated on images 217 - 231 of series 6.  No ascites or pneumoperitoneum and no pathologic distension of small bowel. No definite pathologic lymphadenopathy identified within the abdomen or  pelvis at this time.  Prostate gland is enlarged (7.6 x 5.8 cm).  Urinary bladder is unremarkable in appearance.  Musculoskeletal: Bones are osteopenic. There are no aggressive appearing lytic or blastic lesions noted in the visualized portions of the skeleton.   Review of the MIP images confirms the above findings.  IMPRESSION: 1.  Soft tissue stranding adjacent to the distal descending colon. Given the presence of a few scattered colonic diverticula, these findings are concerning for early or mild acute diverticulitis. Clinical correlation is recommended.  No  definite diverticular abscess or signs to suggest a frank perforation are noted at this time. 2.  There is also a fusiform infrarenal abdominal aortic aneurysm measuring 4.3 x 4.6 cm.  This contains some internal atheromatous plaque and/or mural thrombus.  No evidence of active extravasation at this time. 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 3.  Normal appendix. 4.  The appearance of the liver could suggest mild cirrhosis, as above. 5.  Numerous low attenuation renal lesions redemonstrated.  The largest of these are compatible with simple cysts, while the smaller lesions are too small to definitively characterize. 6.  Prostatomegaly. 7.  Additional incidental findings, as above.   Original Report Authenticated By: Trudie Reed, M.D.     1. Diverticulitis 2. AAA  MDM  Pt with long standing hx of GERD presents with similar GERD related pain.  Pain reproducible on exam.  Doubt cardiac etiology.  Pt has permanent pace maker placed several months ago.  Currently denies cp, sob, lightheadedness or dizziness.  Does c/o constipation requiring use of laxative but has BM yesterday.    Will give GI cocktail, check EKG, and perform rectal exam.  Will also check CBC, CMP, lipase, Trop.  Pt currently in NAD, VSS.  May consider further imaging as necessary. Care discussed with my attending.    8:51 AM My attending has evaluated pt and were able to palpate a palpable abdominal aorta, with bedside US it measured 4x4.  Plan to obtain chest/abd/pelvic CT angio to evaluate for aneurysm.    11:34 AM CTA confirms presents of AAA, which shows no acute changes.  There are evidence suggestive of diverticulitis, which may explain his presenting complaint.  My attending has reevaluate and will dc pt with cipro/flagyl, f/u referral to his PCP and to vascular surgeon.  Pt given pain medication as well.  Pt stable to be discharge.    BP 103/65  Pulse 61  Temp 98.3 F (36.8 C) (Oral)  Resp 18  SpO2  100%  I have reviewed nursing notes and vital signs. I personally reviewed the imaging tests through PACS system  I reviewed available ER/hospitalization records thought the EMR   Fayrene Helper, [place] 11/11/12 1136

## 2012-12-11 ENCOUNTER — Encounter: Payer: Self-pay | Admitting: Internal Medicine

## 2012-12-11 ENCOUNTER — Ambulatory Visit (INDEPENDENT_AMBULATORY_CARE_PROVIDER_SITE_OTHER): Payer: Medicare Other | Admitting: Internal Medicine

## 2012-12-11 VITALS — BP 106/71 | HR 79 | Ht 73.0 in | Wt 159.0 lb

## 2012-12-11 DIAGNOSIS — R001 Bradycardia, unspecified: Secondary | ICD-10-CM

## 2012-12-11 DIAGNOSIS — I4891 Unspecified atrial fibrillation: Secondary | ICD-10-CM

## 2012-12-11 DIAGNOSIS — I428 Other cardiomyopathies: Secondary | ICD-10-CM

## 2012-12-11 DIAGNOSIS — I498 Other specified cardiac arrhythmias: Secondary | ICD-10-CM

## 2012-12-11 LAB — PACEMAKER DEVICE OBSERVATION
BATTERY VOLTAGE: 2.993 V
BMOD-0001RV: 2
DEVICE MODEL PM: 7230364
RV LEAD IMPEDENCE PM: 587.5 Ohm

## 2012-12-11 NOTE — Progress Notes (Signed)
PCP: Robynn Pane, MD Primary Cardiologist:  Jamarkis Branam is a 78 y.o. male who presents today for routine electrophysiology followup.  Since having his pacemaker implanted, the patient reports doing very well. His dizziness and daytime somnolence have resolved.   Today, he denies symptoms of palpitations, chest pain, shortness of breath,  lower extremity edema,  presyncope, or syncope.  The patient is otherwise without complaint today.   Past Medical History  Diagnosis Date  . Hypertension   . CHF (congestive heart failure)   . GERD (gastroesophageal reflux disease)   . Recurrent upper respiratory infection (URI)     recent cold- treating /w OTC  . Arthritis     "legs are stiff" , had spinal injections for pain relieve 3-4 yrs. ago  . Atrial fibrillation-permanent   . Constipation   . Depression   . Nonischemic/ ischemic cardiomyopathy     Single-vessel coronary disease catheterization 2004-Myoview showing inferior wall perfusion defect Myoview 2006  . Subdural hematoma     Prior evacuation  . Cataract   . Bradycardia    Past Surgical History  Procedure Date  . Evacuation of subdural hematoma   . Eye surgery     cataract extracted in L eye  . Hernia repair     R side inguinal repair   . Brain surgery     2002  . Tonsillectomy     1957  . Cataract extraction w/phaco 01/30/2012    Procedure: CATARACT EXTRACTION PHACO AND INTRAOCULAR LENS PLACEMENT (IOC);  Surgeon: Shade Flood, MD;  Location: [facility] OR;  Service: Ophthalmology;  Laterality: Right;  . Pars plana vitrectomy 07/30/2012    Procedure: PARS PLANA VITRECTOMY WITH 23 GAUGE;  Surgeon: Shade Flood, MD;  Location: [facility] OR;  Service: Ophthalmology;  Laterality: Right;  Insertion of Silicone Oil  . Gas/fluid exchange 07/30/2012    Procedure: GAS/FLUID EXCHANGE;  Surgeon: Shade Flood, MD;  Location: [facility] OR;  Service: Ophthalmology;  Laterality: Right;  . Pacemaker insertion 08/26/12    SJM Accent SR RF pacemaker     Current Outpatient Prescriptions  Medication Sig Dispense Refill  . aspirin EC 81 MG EC tablet Take 1 tablet (81 mg total) by mouth daily.  30 tablet  3  . atorvastatin (LIPITOR) 10 MG tablet Take 10 mg by mouth daily.       . carvedilol (COREG) 6.25 MG tablet Take 1 tablet (6.25 mg total) by mouth 2 (two) times daily with a meal.  60 tablet  3  . furosemide (LASIX) 40 MG tablet Take 40 mg by mouth daily.      . isosorbide dinitrate (ISORDIL) 20 MG tablet Take 20 mg by mouth 2 (two) times daily.      . Multiple Vitamin (MULTIVITAMIN WITH MINERALS) TABS Take 1 tablet by mouth daily.      . pantoprazole (PROTONIX) 40 MG tablet Take 40 mg by mouth 2 (two) times daily.      . ramipril (ALTACE) 2.5 MG capsule Take 1 capsule (2.5 mg total) by mouth daily.  30 capsule  3  . spironolactone (ALDACTONE) 25 MG tablet Take 25 mg by mouth daily.      . Tamsulosin HCl (FLOMAX) 0.4 MG CAPS Take 0.4 mg by mouth daily.         Physical Exam: Filed Vitals:   12/11/12 0922  BP: 106/71  Pulse: 79  Height: 6\' 1"  (1.854 m)  Weight: 159 lb (72.122 kg)  SpO2: 96%    GEN- The  patient is well appearing, alert and oriented x 3 today.   Head- normocephalic, atraumatic Eyes-  Sclera clear, conjunctiva pink Ears- hearing intact Oropharynx- clear Lungs- Clear to ausculation bilaterally, normal work of breathing Chest- pacemaker pocket is well healed Heart- Regular rate and rhythm (paced) GI- soft, NT, ND, + BS Extremities- no clubbing, cyanosis, or edema  Pacemaker interrogation- reviewed in detail today,  See PACEART report  Assessment and Plan:  1. Bradycardia Normal pacemaker function See Pace Art report No changes today   2. afib Not felt to be a candidate for anticoagulation  No changes today  Return in 9 months Follow-up with Dr Sharyn Lull as scheduled

## 2012-12-11 NOTE — Patient Instructions (Signed)
Your physician wants you to follow-up in: Sept with Dr Johney Frame Bonita Quin will receive a reminder letter in the mail two months in advance. If you don't receive a letter, please call our office to schedule the follow-up appointment.

## 2012-12-14 ENCOUNTER — Emergency Department (HOSPITAL_COMMUNITY)
Admission: EM | Admit: 2012-12-14 | Discharge: 2012-12-14 | Disposition: A | Discharge status: Home / Self Care | Payer: Medicare Other | Attending: Emergency Medicine | Admitting: Emergency Medicine

## 2012-12-14 ENCOUNTER — Emergency Department (HOSPITAL_COMMUNITY): Payer: Medicare Other

## 2012-12-14 ENCOUNTER — Encounter (HOSPITAL_COMMUNITY): Payer: Self-pay | Admitting: *Deleted

## 2012-12-14 DIAGNOSIS — Z8679 Personal history of other diseases of the circulatory system: Secondary | ICD-10-CM | POA: Insufficient documentation

## 2012-12-14 DIAGNOSIS — K219 Gastro-esophageal reflux disease without esophagitis: Secondary | ICD-10-CM | POA: Insufficient documentation

## 2012-12-14 DIAGNOSIS — Z7982 Long term (current) use of aspirin: Secondary | ICD-10-CM | POA: Insufficient documentation

## 2012-12-14 DIAGNOSIS — Z8659 Personal history of other mental and behavioral disorders: Secondary | ICD-10-CM | POA: Insufficient documentation

## 2012-12-14 DIAGNOSIS — Z950 Presence of cardiac pacemaker: Secondary | ICD-10-CM | POA: Insufficient documentation

## 2012-12-14 DIAGNOSIS — I509 Heart failure, unspecified: Secondary | ICD-10-CM | POA: Insufficient documentation

## 2012-12-14 DIAGNOSIS — I10 Essential (primary) hypertension: Secondary | ICD-10-CM | POA: Insufficient documentation

## 2012-12-14 DIAGNOSIS — Z87891 Personal history of nicotine dependence: Secondary | ICD-10-CM | POA: Insufficient documentation

## 2012-12-14 DIAGNOSIS — R5381 Other malaise: Secondary | ICD-10-CM | POA: Insufficient documentation

## 2012-12-14 DIAGNOSIS — Z961 Presence of intraocular lens: Secondary | ICD-10-CM | POA: Insufficient documentation

## 2012-12-14 DIAGNOSIS — R079 Chest pain, unspecified: Secondary | ICD-10-CM | POA: Insufficient documentation

## 2012-12-14 DIAGNOSIS — Z79899 Other long term (current) drug therapy: Secondary | ICD-10-CM | POA: Insufficient documentation

## 2012-12-14 DIAGNOSIS — Z9849 Cataract extraction status, unspecified eye: Secondary | ICD-10-CM | POA: Insufficient documentation

## 2012-12-14 DIAGNOSIS — Z8739 Personal history of other diseases of the musculoskeletal system and connective tissue: Secondary | ICD-10-CM | POA: Insufficient documentation

## 2012-12-14 DIAGNOSIS — Z8709 Personal history of other diseases of the respiratory system: Secondary | ICD-10-CM | POA: Insufficient documentation

## 2012-12-14 LAB — COMPREHENSIVE METABOLIC PANEL
BUN: 12 mg/dL (ref 6–23)
CO2: 21 mEq/L (ref 19–32)
Calcium: 9.3 mg/dL (ref 8.4–10.5)
Chloride: 102 mEq/L (ref 96–112)
Creatinine, Ser: 1.08 mg/dL (ref 0.50–1.35)
GFR calc Af Amer: 69 mL/min — ABNORMAL LOW (ref >90)
GFR calc non Af Amer: 60 mL/min — ABNORMAL LOW (ref >90)
Total Bilirubin: 1.1 mg/dL (ref 0.3–1.2)

## 2012-12-14 LAB — CBC WITH DIFFERENTIAL/PLATELET
Band Neutrophils: 0 % (ref 0–10)
Basophils Relative: 4 % — ABNORMAL HIGH (ref 0–1)
Blasts: 0 %
HCT: 36.9 % — ABNORMAL LOW (ref 39.0–52.0)
Hemoglobin: 12.6 g/dL — ABNORMAL LOW (ref 13.0–17.0)
Lymphocytes Relative: 48 % — ABNORMAL HIGH (ref 12–46)
Lymphs Abs: 1.5 10*3/uL (ref 0.7–4.0)
MCHC: 34.1 g/dL (ref 30.0–36.0)
Monocytes Absolute: 0.1 10*3/uL (ref 0.1–1.0)
Monocytes Relative: 4 % (ref 3–12)
Neutro Abs: 1.3 10*3/uL — ABNORMAL LOW (ref 1.7–7.7)
Neutrophils Relative %: 42 % — ABNORMAL LOW (ref 43–77)
Promyelocytes Absolute: 0 %
RDW: 17.2 % — ABNORMAL HIGH (ref 11.5–15.5)
WBC: 3.1 10*3/uL — ABNORMAL LOW (ref 4.0–10.5)

## 2012-12-14 LAB — CG4 I-STAT (LACTIC ACID): Lactic Acid, Venous: 1.45 mmol/L (ref 0.5–2.2)

## 2012-12-14 LAB — TROPONIN I: Troponin I: <0.3 ng/mL (ref <0.30)

## 2012-12-14 LAB — LIPASE, BLOOD: Lipase: 43 U/L (ref 11–59)

## 2012-12-14 MED ORDER — PANTOPRAZOLE SODIUM 40 MG IV SOLR
40.0000 mg | Freq: Once | INTRAVENOUS | Status: AC
  Administered 2012-12-14: 40 mg via INTRAVENOUS
  Filled 2012-12-14: qty 40

## 2012-12-14 MED ORDER — GI COCKTAIL ~~LOC~~
30.0000 mL | Freq: Once | ORAL | Status: AC
  Administered 2012-12-14: 30 mL via ORAL
  Filled 2012-12-14: qty 30

## 2012-12-14 NOTE — ED Notes (Signed)
Pt had recent pacemaker placed 9/3. Reports having chest pain, a burning sensation that starts in epigastric area and radiates up into his chest. No relief with acid reflux meds. Also reports increase in generalized weakness and fatigue over past week with decreased appetite. Denies n/v/d.

## 2012-12-14 NOTE — ED Provider Notes (Signed)
History     CSN: [id number]  Arrival date & time 12/14/12  2130   First MD Initiated Contact with Patient 12/14/12 [phone]      Chief Complaint  Patient presents with  . Chest Pain     Patient is a 78 y.o. male presenting with chest pain. The history is provided by the patient.  Chest Pain The chest pain began 3 - 5 days ago. Chest pain occurs constantly. The chest pain is unchanged. The pain is associated with eating. The severity of the pain is mild. The quality of the pain is described as burning. Radiates to: pain starts in epigastrium and goes into chest. Chest pain is worsened by eating. Pertinent negatives for primary symptoms include no fever, no shortness of breath, no cough, no abdominal pain and no vomiting.  Pertinent negatives for associated symptoms include no diaphoresis and no near-syncope. Treatments tried: rest.   pt reports epigastric/chest burning for past 3 days No vomiting.  No diarrhea.  No rectal bleeding reported He reports some fatigue but this is not a new issue.  No back pain.  No SOB is reported  Past Medical History  Diagnosis Date  . Hypertension   . CHF (congestive heart failure)   . GERD (gastroesophageal reflux disease)   . Recurrent upper respiratory infection (URI)     recent cold- treating /w OTC  . Arthritis     "legs are stiff" , had spinal injections for pain relieve 3-4 yrs. ago  . Atrial fibrillation-permanent   . Constipation   . Depression   . Nonischemic/ ischemic cardiomyopathy     Single-vessel coronary disease catheterization 2004-Myoview showing inferior wall perfusion defect Myoview 2006  . Subdural hematoma     Prior evacuation  . Cataract   . Bradycardia     Past Surgical History  Procedure Date  . Evacuation of subdural hematoma   . Eye surgery     cataract extracted in L eye  . Hernia repair     R side inguinal repair   . Brain surgery     2002  . Tonsillectomy     1957  . Cataract extraction w/phaco 01/30/2012    Procedure: CATARACT EXTRACTION PHACO AND INTRAOCULAR LENS PLACEMENT (IOC);  Surgeon: Shade Flood, MD;  Location: [facility] OR;  Service: Ophthalmology;  Laterality: Right;  . Pars plana vitrectomy 07/30/2012    Procedure: PARS PLANA VITRECTOMY WITH 23 GAUGE;  Surgeon: Shade Flood, MD;  Location: [facility] OR;  Service: Ophthalmology;  Laterality: Right;  Insertion of Silicone Oil  . Gas/fluid exchange 07/30/2012    Procedure: GAS/FLUID EXCHANGE;  Surgeon: Shade Flood, MD;  Location: [facility] OR;  Service: Ophthalmology;  Laterality: Right;  . Pacemaker insertion 08/26/12    SJM Accent SR RF pacemaker    Family History  Problem Relation Age of Onset  . Anesthesia problems Neg Hx   . Hypotension Neg Hx   . Malignant hyperthermia Neg Hx   . Pseudochol deficiency Neg Hx     History  Substance Use Topics  . Smoking status: Former Smoker -- 0.2 packs/day for 15 years    Quit date: 12/24/1994  . Smokeless tobacco: Not on file  . Alcohol Use: No      Review of Systems  Constitutional: Negative for fever and diaphoresis.  Respiratory: Negative for cough and shortness of breath.   Cardiovascular: Positive for chest pain. Negative for near-syncope.  Gastrointestinal: Negative for vomiting, abdominal pain and blood in stool.  Musculoskeletal: Negative for  back pain.  Neurological: Negative for syncope.  Psychiatric/Behavioral: Negative for agitation.  All other systems reviewed and are negative.    Allergies  Review of patient's allergies indicates no known allergies.  Home Medications   Current Outpatient Rx  Name  Route  Sig  Dispense  Refill  . ASPIRIN 81 MG PO TBEC   Oral   Take 1 tablet (81 mg total) by mouth daily.   30 tablet   3   . ATORVASTATIN CALCIUM 10 MG PO TABS   Oral   Take 10 mg by mouth daily.          Marland Kitchen CARVEDILOL 6.25 MG PO TABS   Oral   Take 1 tablet (6.25 mg total) by mouth 2 (two) times daily with a meal.   60 tablet   3   . FUROSEMIDE 40 MG PO TABS   Oral   Take  40 mg by mouth daily.         . ISOSORBIDE DINITRATE 20 MG PO TABS   Oral   Take 20 mg by mouth 2 (two) times daily.         . ADULT MULTIVITAMIN W/MINERALS CH   Oral   Take 1 tablet by mouth daily.         Marland Kitchen PANTOPRAZOLE SODIUM 40 MG PO TBEC   Oral   Take 40 mg by mouth 2 (two) times daily.         Marland Kitchen RAMIPRIL 2.5 MG PO CAPS   Oral   Take 1 capsule (2.5 mg total) by mouth daily.   30 capsule   3   . SPIRONOLACTONE 25 MG PO TABS   Oral   Take 25 mg by mouth daily.         Marland Kitchen TAMSULOSIN HCL 0.4 MG PO CAPS   Oral   Take 0.4 mg by mouth daily.            BP 98/49  Pulse 79  Temp 98.4 F (36.9 C) (Oral)  Resp 16  SpO2 100%  Physical Exam CONSTITUTIONAL: Well developed/well nourished HEAD AND FACE: Normocephalic/atraumatic EYES: OD surgically corrected.  No icterus ENMT: Mucous membranes moist NECK: supple no meningeal signs SPINE:entire spine nontender CV: irregular no murmurs/rubs/gallops noted LUNGS: Lungs are clear to auscultation bilaterally, no apparent distress ABDOMEN: soft, nontender, no rebound or guarding GU:no cva tenderness NEURO: Pt is awake/alert, moves all extremitiesx4 EXTREMITIES: pulses normal, full ROM SKIN: warm, color normal PSYCH: no abnormalities of mood noted  ED Course  Procedures    Labs Reviewed  CBC WITH DIFFERENTIAL  COMPREHENSIVE METABOLIC PANEL  TROPONIN I  LIPASE, BLOOD  8:08 AM Pt seen recently by EP cardiology had normal pacemaker function and he is not felt to be anticoagulation per cardiology Pt well appearing.  He reports he has had this before and has had good relief with GI cocktail.  Will initiate cardiac workup given his age, however given his appearance/exam suspicion for ACS is low.   1:23 PM Pt improved He is ambulatory at baseline No new complaints The monitor did capture some paced beats but pt asymptomatic  I have arranged f/u with cardiology, spoke to [place] who is on call for harwani Stable  for d/c Pt agreeable with plan    MDM  Nursing notes including past medical history and social history reviewed and considered in documentation xrays reviewed and considered Labs/vital reviewed and considered Previous records reviewed and considered        Date:  12/14/2012  Rate: 146 (though on monitor his heart is in 70s)  Rhythm: atrial fibrillation  QRS Axis: left  Intervals: normal  ST/T Wave abnormalities: nonspecific ST changes  Conduction Disutrbances:none  Narrative Interpretation:   Old EKG Reviewed: unchanged   Date: 12/14/2012 1049  Rate: 62  Rhythm: atrial fibrillation  QRS Axis: left  Intervals: normal  ST/T Wave abnormalities: nonspecific ST changes  Conduction Disutrbances:nonspecific intraventricular conduction delay  Narrative Interpretation:   Old EKG Reviewed: unchanged   Date: 12/14/2012 1258  Rate: 60  Rhythm: atrial fibrillation  QRS Axis: left  Intervals: QT prolonged  ST/T Wave abnormalities: nonspecific ST changes  Conduction Disutrbances:paced beats  Narrative Interpretation:   Old EKG Reviewed: unchanged      Joya Gaskins, MD 12/14/12 1325

## 2012-12-21 ENCOUNTER — Emergency Department (HOSPITAL_COMMUNITY)
Admission: EM | Admit: 2012-12-21 | Discharge: 2012-12-21 | Disposition: A | Discharge status: Home / Self Care | Payer: Medicare Other | Attending: Emergency Medicine | Admitting: Emergency Medicine

## 2012-12-21 ENCOUNTER — Encounter (HOSPITAL_COMMUNITY): Payer: Self-pay | Admitting: Emergency Medicine

## 2012-12-21 DIAGNOSIS — F3289 Other specified depressive episodes: Secondary | ICD-10-CM | POA: Insufficient documentation

## 2012-12-21 DIAGNOSIS — Z79899 Other long term (current) drug therapy: Secondary | ICD-10-CM | POA: Insufficient documentation

## 2012-12-21 DIAGNOSIS — Z8669 Personal history of other diseases of the nervous system and sense organs: Secondary | ICD-10-CM | POA: Insufficient documentation

## 2012-12-21 DIAGNOSIS — Z7982 Long term (current) use of aspirin: Secondary | ICD-10-CM | POA: Insufficient documentation

## 2012-12-21 DIAGNOSIS — Z87891 Personal history of nicotine dependence: Secondary | ICD-10-CM | POA: Insufficient documentation

## 2012-12-21 DIAGNOSIS — J069 Acute upper respiratory infection, unspecified: Secondary | ICD-10-CM | POA: Insufficient documentation

## 2012-12-21 DIAGNOSIS — F329 Major depressive disorder, single episode, unspecified: Secondary | ICD-10-CM | POA: Insufficient documentation

## 2012-12-21 DIAGNOSIS — I10 Essential (primary) hypertension: Secondary | ICD-10-CM | POA: Insufficient documentation

## 2012-12-21 DIAGNOSIS — I498 Other specified cardiac arrhythmias: Secondary | ICD-10-CM | POA: Insufficient documentation

## 2012-12-21 DIAGNOSIS — I509 Heart failure, unspecified: Secondary | ICD-10-CM | POA: Insufficient documentation

## 2012-12-21 DIAGNOSIS — R1033 Periumbilical pain: Secondary | ICD-10-CM | POA: Insufficient documentation

## 2012-12-21 DIAGNOSIS — M129 Arthropathy, unspecified: Secondary | ICD-10-CM | POA: Insufficient documentation

## 2012-12-21 DIAGNOSIS — K219 Gastro-esophageal reflux disease without esophagitis: Secondary | ICD-10-CM | POA: Insufficient documentation

## 2012-12-21 DIAGNOSIS — I4891 Unspecified atrial fibrillation: Secondary | ICD-10-CM | POA: Insufficient documentation

## 2012-12-21 DIAGNOSIS — Z87828 Personal history of other (healed) physical injury and trauma: Secondary | ICD-10-CM | POA: Insufficient documentation

## 2012-12-21 MED ORDER — GI COCKTAIL ~~LOC~~
30.0000 mL | Freq: Once | ORAL | Status: DC
  Administered 2012-12-21: 30 mL via ORAL
  Filled 2012-12-21: qty 30

## 2012-12-21 NOTE — ED Provider Notes (Signed)
Medical screening examination/treatment/procedure(s) were performed by non-physician practitioner and as supervising physician I was immediately available for consultation/collaboration.   Rito Lecomte L Eutimio Gharibian, MD 12/21/12 0730 

## 2012-12-21 NOTE — ED Provider Notes (Signed)
History     CSN: [id number]  Arrival date & time 12/21/12  0544   First MD Initiated Contact with Patient 12/21/12 0602      Chief Complaint  Patient presents with  . Gastrophageal Reflux    (Consider location/radiation/quality/duration/timing/severity/associated sxs/prior treatment) Patient is a 78 y.o. male presenting with GERD. The history is provided by the patient.  Gastrophageal Reflux This is a recurrent problem. The current episode started in the past 7 days. The problem occurs constantly. Associated symptoms include abdominal pain. Pertinent negatives include no chest pain, chills, fever, nausea or vomiting. Associated symptoms comments: Symptoms of epigastric burning sensation, worse after eating, for several days. He relates symptoms to known history of GERD. He takes his medications as prescribed but reports poor control. No vomiting. He denies chest pain, SOB, or fever. Normal bowel movements without melena..    Past Medical History  Diagnosis Date  . Hypertension   . CHF (congestive heart failure)   . GERD (gastroesophageal reflux disease)   . Recurrent upper respiratory infection (URI)     recent cold- treating /w OTC  . Arthritis     "legs are stiff" , had spinal injections for pain relieve 3-4 yrs. ago  . Atrial fibrillation-permanent   . Constipation   . Depression   . Nonischemic/ ischemic cardiomyopathy     Single-vessel coronary disease catheterization 2004-Myoview showing inferior wall perfusion defect Myoview 2006  . Subdural hematoma     Prior evacuation  . Cataract   . Bradycardia     Past Surgical History  Procedure Date  . Evacuation of subdural hematoma   . Eye surgery     cataract extracted in L eye  . Hernia repair     R side inguinal repair   . Brain surgery     2002  . Tonsillectomy     1957  . Cataract extraction w/phaco 01/30/2012    Procedure: CATARACT EXTRACTION PHACO AND INTRAOCULAR LENS PLACEMENT (IOC);  Surgeon: Shade Flood,  MD;  Location: [facility] OR;  Service: Ophthalmology;  Laterality: Right;  . Pars plana vitrectomy 07/30/2012    Procedure: PARS PLANA VITRECTOMY WITH 23 GAUGE;  Surgeon: Shade Flood, MD;  Location: [facility] OR;  Service: Ophthalmology;  Laterality: Right;  Insertion of Silicone Oil  . Gas/fluid exchange 07/30/2012    Procedure: GAS/FLUID EXCHANGE;  Surgeon: Shade Flood, MD;  Location: [facility] OR;  Service: Ophthalmology;  Laterality: Right;  . Pacemaker insertion 08/26/12    SJM Accent SR RF pacemaker    Family History  Problem Relation Age of Onset  . Anesthesia problems Neg Hx   . Hypotension Neg Hx   . Malignant hyperthermia Neg Hx   . Pseudochol deficiency Neg Hx     History  Substance Use Topics  . Smoking status: Former Smoker -- 0.2 packs/day for 15 years    Quit date: 12/24/1994  . Smokeless tobacco: Not on file  . Alcohol Use: No      Review of Systems  Constitutional: Negative for fever and chills.  HENT: Negative.   Respiratory: Negative.  Negative for shortness of breath.   Cardiovascular: Negative.  Negative for chest pain.  Gastrointestinal: Positive for abdominal pain. Negative for nausea, vomiting and blood in stool.  Genitourinary: Negative for dysuria.  Musculoskeletal: Negative.   Skin: Negative.   Neurological: Negative.     Allergies  Review of patient's allergies indicates no known allergies.  Home Medications   Current Outpatient Rx  Name  Route  Sig  Dispense  Refill  . ASPIRIN 81 MG PO TBEC   Oral   Take 1 tablet (81 mg total) by mouth daily.   30 tablet   3   . ATORVASTATIN CALCIUM 10 MG PO TABS   Oral   Take 10 mg by mouth daily.          Marland Kitchen CARVEDILOL 6.25 MG PO TABS   Oral   Take 1 tablet (6.25 mg total) by mouth 2 (two) times daily with a meal.   60 tablet   3   . DOCUSATE SODIUM 100 MG PO CAPS   Oral   Take 100 mg by mouth at bedtime as needed. For constipation         . FUROSEMIDE 40 MG PO TABS   Oral   Take 40 mg by mouth daily.          Marland Kitchen HYDRALAZINE HCL 25 MG PO TABS   Oral   Take 25 mg by mouth 2 (two) times daily.         . ISOSORBIDE DINITRATE 20 MG PO TABS   Oral   Take 20 mg by mouth 2 (two) times daily.         . LUBIPROSTONE 24 MCG PO CAPS   Oral   Take 24 mcg by mouth 2 (two) times daily with a meal.         . ADULT MULTIVITAMIN W/MINERALS CH   Oral   Take 1 tablet by mouth daily.         Marland Kitchen PANTOPRAZOLE SODIUM 40 MG PO TBEC   Oral   Take 40 mg by mouth 2 (two) times daily.         Marland Kitchen RAMIPRIL 2.5 MG PO CAPS   Oral   Take 1 capsule (2.5 mg total) by mouth daily.   30 capsule   3   . SPIRONOLACTONE 25 MG PO TABS   Oral   Take 25 mg by mouth daily.         Marland Kitchen TAMSULOSIN HCL 0.4 MG PO CAPS   Oral   Take 0.4 mg by mouth daily.            BP 100/58  Pulse 60  Temp 98.1 F (36.7 C) (Oral)  Resp 10  SpO2 100%  Physical Exam  Constitutional: He is oriented to person, place, and time. He appears well-developed and well-nourished.  HENT:  Head: Normocephalic.  Neck: Normal range of motion. Neck supple.  Cardiovascular: Normal rate and regular rhythm.   Pulmonary/Chest: Effort normal and breath sounds normal.  Abdominal: Soft. Bowel sounds are normal. He exhibits no distension. There is tenderness. There is no rebound and no guarding.       Mild epigastric tenderness without rebound or guarding.  Musculoskeletal: Normal range of motion.  Neurological: He is alert and oriented to person, place, and time.  Skin: Skin is warm and dry. No rash noted.  Psychiatric: He has a normal mood and affect.    ED Course  Procedures (including critical care time)  Labs Reviewed - No data to display No results found.   No diagnosis found.  1. GERD  MDM  Patient with a history of CAD, pacer but without chest pain and has close cardiology follow up. He presents with recurrent symptoms of GERD and is completely better with GI Cocktail, which he states usually controls his symptoms.  Will refer to GI.        Rodena Medin, PA-C  12/21/12 0720 

## 2012-12-21 NOTE — ED Notes (Signed)
Report received, assumed care.  

## 2012-12-21 NOTE — ED Notes (Signed)
PT. ARRIVED WITH EMS FROM HOME , REPORTS GASTROESOPHAGEAL REFLUX FOR SEVERAL  DAYS , POOR APPETITE , DESCRIBES " BURNING" SENSATION AT EPIGASTRIC AREA, DENIES NAUSEA OR VOMITTING / RESPIRATIONS UNLABORED . CBG= 124.

## 2012-12-31 ENCOUNTER — Other Ambulatory Visit: Payer: Self-pay | Admitting: Gastroenterology

## 2012-12-31 DIAGNOSIS — R12 Heartburn: Secondary | ICD-10-CM

## 2013-01-06 ENCOUNTER — Other Ambulatory Visit: Payer: Medicare Other

## 2013-01-06 ENCOUNTER — Ambulatory Visit
Admission: RE | Admit: 2013-01-06 | Discharge: 2013-01-06 | Disposition: A | Discharge status: Home / Self Care | Payer: Medicare Other | Source: Ambulatory Visit | Attending: Gastroenterology | Admitting: Gastroenterology

## 2013-01-06 ENCOUNTER — Other Ambulatory Visit: Payer: Self-pay | Admitting: Gastroenterology

## 2013-01-06 DIAGNOSIS — R12 Heartburn: Secondary | ICD-10-CM

## 2013-01-08 IMAGING — CR DG CHEST 2V
2 series · 2 of 2 positions shown · non-contrast
Comparison: 08/24/2012.

CLINICAL DATA: Post pacemaker insertion.

CHEST - 2 VIEW

[w chest pa]
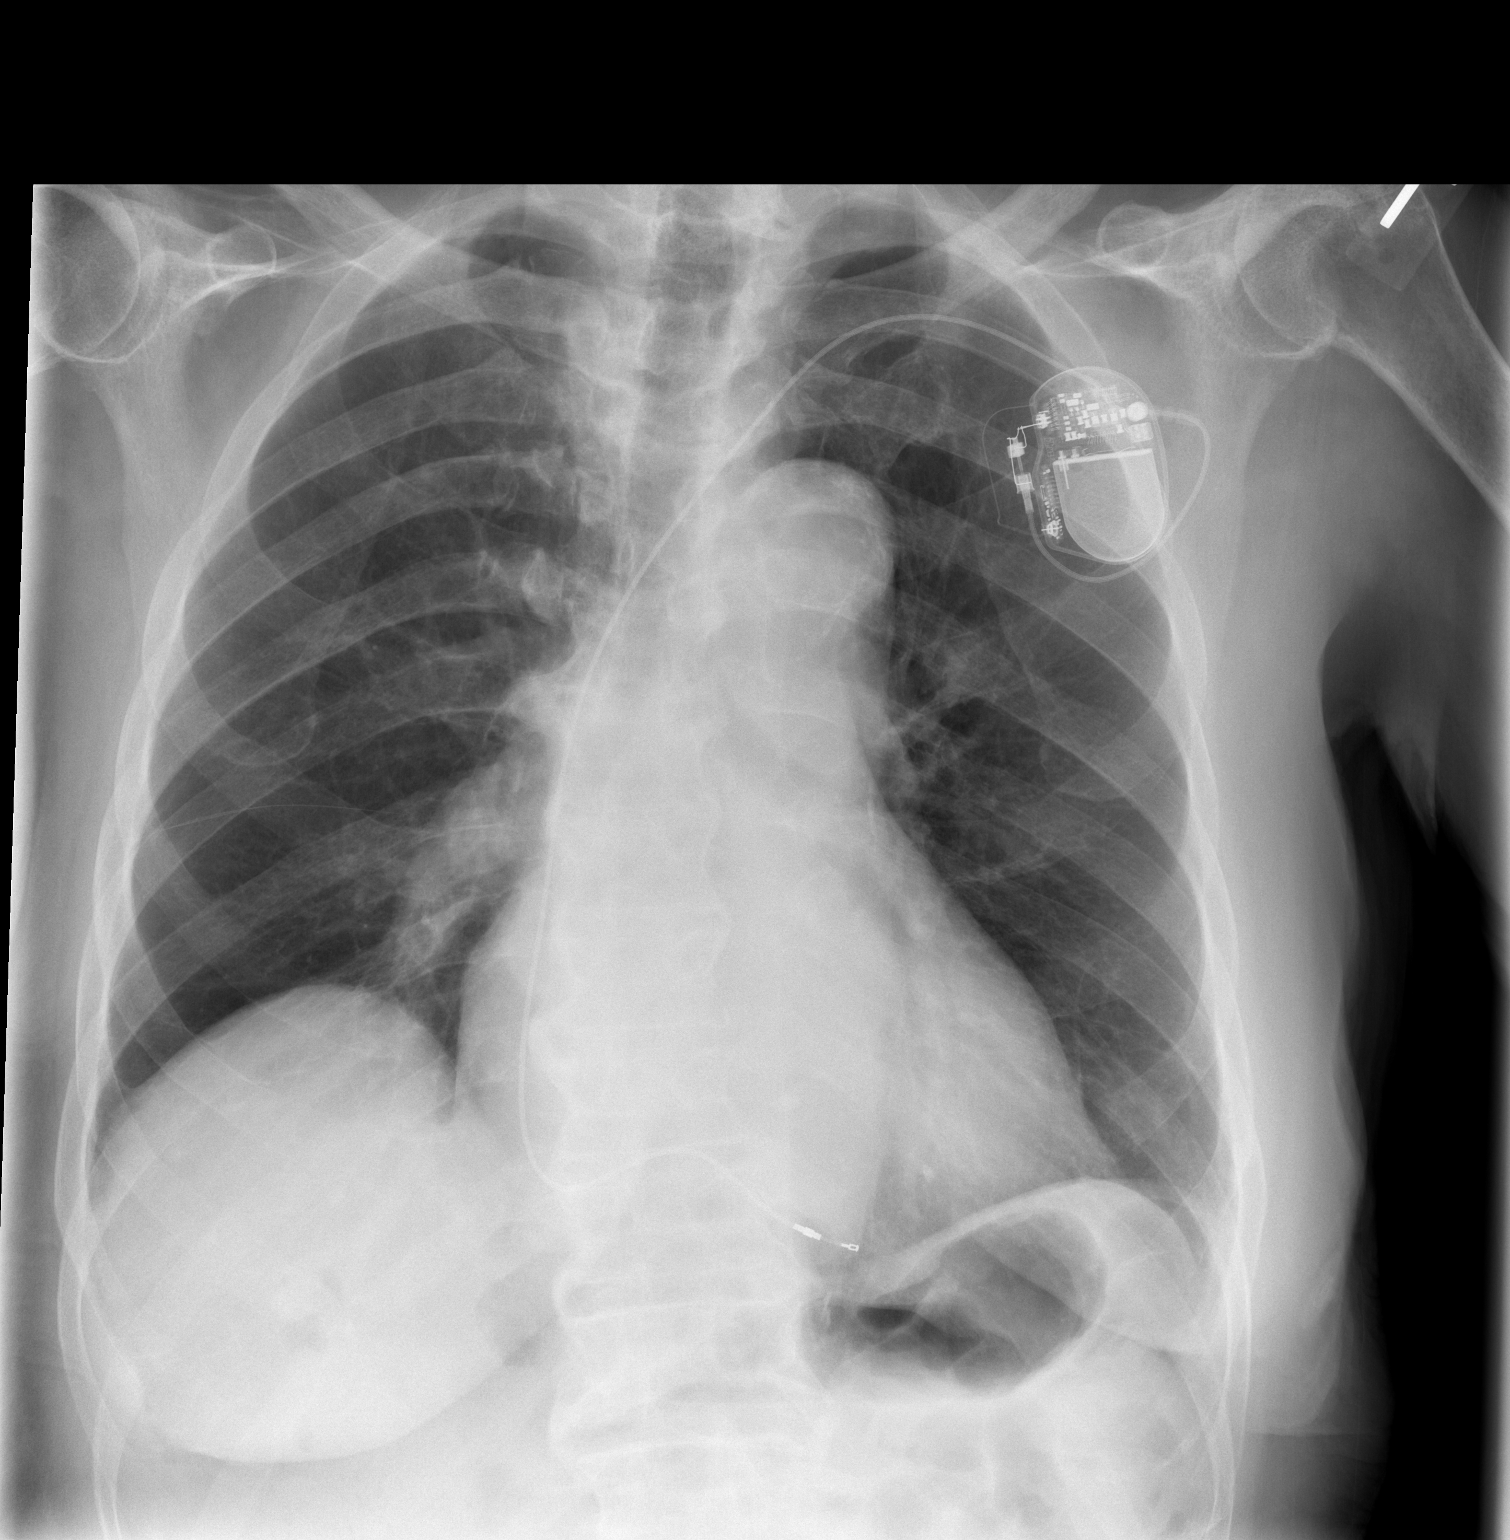

[w chest lat]
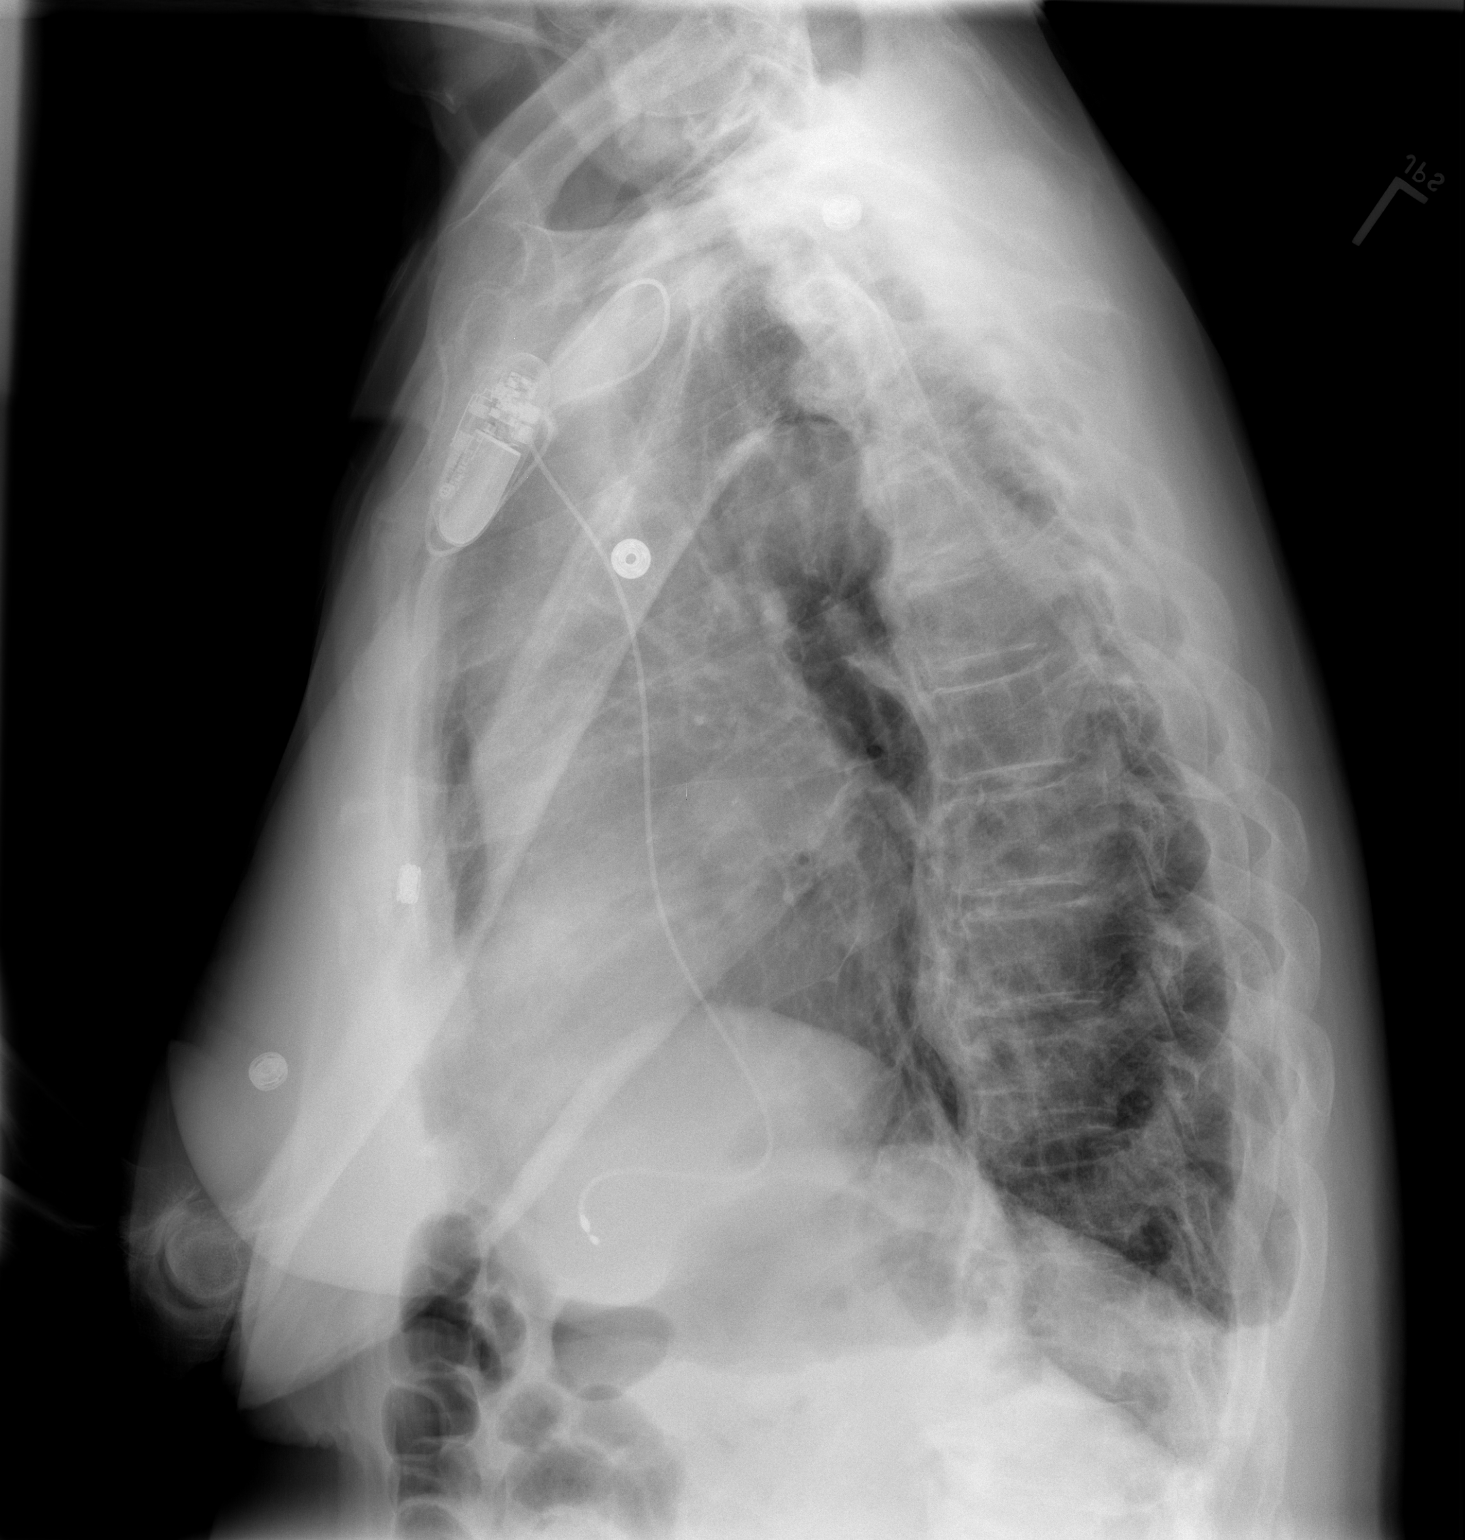

[2 of 2 positions shown; findings below may reference images not displayed]

FINDINGS: Trachea is midline.  Heart is mildly enlarged, stable.
Thoracic aorta is calcified.  Left subclavian pacemaker lead tip
projects over the right ventricle.  No pneumothorax.  The lungs are
somewhat low in volume with bibasilar atelectasis and/or scarring.
Trace left pleural fluid.  Extensive degenerative change in the
spine.
IMPRESSION: 1.  Left subclavian pacemaker insertion without pneumothorax.
2.  Tiny left pleural effusion.
3.  Bibasilar atelectasis and/or scarring.

## 2013-02-18 ENCOUNTER — Other Ambulatory Visit: Payer: Self-pay | Admitting: Cardiology

## 2013-02-18 DIAGNOSIS — M549 Dorsalgia, unspecified: Secondary | ICD-10-CM

## 2013-02-23 ENCOUNTER — Ambulatory Visit
Admission: RE | Admit: 2013-02-23 | Discharge: 2013-02-23 | Disposition: A | Discharge status: Home / Self Care | Payer: Medicare Other | Source: Ambulatory Visit | Attending: Cardiology | Admitting: Cardiology

## 2013-02-23 ENCOUNTER — Inpatient Hospital Stay: Admission: RE | Admit: 2013-02-23 | Payer: Medicare Other | Source: Ambulatory Visit

## 2013-02-23 DIAGNOSIS — M549 Dorsalgia, unspecified: Secondary | ICD-10-CM

## 2013-02-23 MED ORDER — METHYLPREDNISOLONE ACETATE 40 MG/ML INJ SUSP (RADIOLOG
120.0000 mg | Freq: Once | INTRAMUSCULAR | Status: AC
  Administered 2013-02-23: 120 mg via EPIDURAL

## 2013-02-23 MED ORDER — IOHEXOL 180 MG/ML  SOLN
1.0000 mL | Freq: Once | INTRAMUSCULAR | Status: AC | PRN
  Administered 2013-02-23: 1 mL via EPIDURAL

## 2013-03-16 ENCOUNTER — Emergency Department (HOSPITAL_COMMUNITY)
Admission: EM | Admit: 2013-03-16 | Discharge: 2013-03-17 | Disposition: A | Discharge status: Home / Self Care | Payer: Medicare Other | Attending: Emergency Medicine | Admitting: Emergency Medicine

## 2013-03-16 ENCOUNTER — Encounter (HOSPITAL_COMMUNITY): Payer: Self-pay | Admitting: Emergency Medicine

## 2013-03-16 DIAGNOSIS — Z79899 Other long term (current) drug therapy: Secondary | ICD-10-CM | POA: Insufficient documentation

## 2013-03-16 DIAGNOSIS — Z8739 Personal history of other diseases of the musculoskeletal system and connective tissue: Secondary | ICD-10-CM | POA: Insufficient documentation

## 2013-03-16 DIAGNOSIS — I10 Essential (primary) hypertension: Secondary | ICD-10-CM | POA: Insufficient documentation

## 2013-03-16 DIAGNOSIS — F3289 Other specified depressive episodes: Secondary | ICD-10-CM | POA: Insufficient documentation

## 2013-03-16 DIAGNOSIS — Z8709 Personal history of other diseases of the respiratory system: Secondary | ICD-10-CM | POA: Insufficient documentation

## 2013-03-16 DIAGNOSIS — I509 Heart failure, unspecified: Secondary | ICD-10-CM | POA: Insufficient documentation

## 2013-03-16 DIAGNOSIS — K219 Gastro-esophageal reflux disease without esophagitis: Secondary | ICD-10-CM | POA: Insufficient documentation

## 2013-03-16 DIAGNOSIS — Z8719 Personal history of other diseases of the digestive system: Secondary | ICD-10-CM | POA: Insufficient documentation

## 2013-03-16 DIAGNOSIS — Z87891 Personal history of nicotine dependence: Secondary | ICD-10-CM | POA: Insufficient documentation

## 2013-03-16 DIAGNOSIS — F329 Major depressive disorder, single episode, unspecified: Secondary | ICD-10-CM | POA: Insufficient documentation

## 2013-03-16 DIAGNOSIS — Z8679 Personal history of other diseases of the circulatory system: Secondary | ICD-10-CM | POA: Insufficient documentation

## 2013-03-16 DIAGNOSIS — Z7982 Long term (current) use of aspirin: Secondary | ICD-10-CM | POA: Insufficient documentation

## 2013-03-16 DIAGNOSIS — N390 Urinary tract infection, site not specified: Secondary | ICD-10-CM | POA: Insufficient documentation

## 2013-03-16 DIAGNOSIS — R34 Anuria and oliguria: Secondary | ICD-10-CM | POA: Insufficient documentation

## 2013-03-16 LAB — CBC
HCT: 34.4 % — ABNORMAL LOW (ref 39.0–52.0)
Hemoglobin: 12.4 g/dL — ABNORMAL LOW (ref 13.0–17.0)
MCV: 71.2 fL — ABNORMAL LOW (ref 78.0–100.0)
RDW: 16.8 % — ABNORMAL HIGH (ref 11.5–15.5)
WBC: 4.3 10*3/uL (ref 4.0–10.5)

## 2013-03-16 NOTE — ED Notes (Signed)
PT. REPORTS URINARY RETENTION WITH BLADDER PRESSURE FOR SEVERAL DAYS  , DENIES FEVER OR CHILLS.

## 2013-03-16 NOTE — ED Notes (Signed)
Bladder scanner in room

## 2013-03-16 NOTE — ED Provider Notes (Signed)
History     CSN: [id number]  Arrival date & time 03/16/13  4098   First MD Initiated Contact with Patient 03/16/13 2307      Chief Complaint  Patient presents with  . Urinary Retention    (Consider location/radiation/quality/duration/timing/severity/associated sxs/prior treatment) HPI Comments: Lives alone states has been eating and drinking well, Hx constipations--takes laxative regularly  Now reporting decreased urine output  Has not urinated since yesterday.  Denies any Hx BPH, recent illness, fever, abdominal pain.  The history is provided by the patient.    Past Medical History  Diagnosis Date  . Hypertension   . CHF (congestive heart failure)   . GERD (gastroesophageal reflux disease)   . Recurrent upper respiratory infection (URI)     recent cold- treating /w OTC  . Arthritis     "legs are stiff" , had spinal injections for pain relieve 3-4 yrs. ago  . Atrial fibrillation-permanent   . Constipation   . Depression   . Nonischemic/ ischemic cardiomyopathy     Single-vessel coronary disease catheterization 2004-Myoview showing inferior wall perfusion defect Myoview 2006  . Subdural hematoma     Prior evacuation  . Cataract   . Bradycardia     Past Surgical History  Procedure Laterality Date  . Evacuation of subdural hematoma    . Eye surgery      cataract extracted in L eye  . Hernia repair      R side inguinal repair   . Brain surgery      2002  . Tonsillectomy      1957  . Cataract extraction w/phaco  01/30/2012    Procedure: CATARACT EXTRACTION PHACO AND INTRAOCULAR LENS PLACEMENT (IOC);  Surgeon: Shade Flood, MD;  Location: [facility] OR;  Service: Ophthalmology;  Laterality: Right;  . Pars plana vitrectomy  07/30/2012    Procedure: PARS PLANA VITRECTOMY WITH 23 GAUGE;  Surgeon: Shade Flood, MD;  Location: [facility] OR;  Service: Ophthalmology;  Laterality: Right;  Insertion of Silicone Oil  . Gas/fluid exchange  07/30/2012    Procedure: GAS/FLUID EXCHANGE;  Surgeon:  Shade Flood, MD;  Location: [facility] OR;  Service: Ophthalmology;  Laterality: Right;  . Pacemaker insertion  08/26/12    SJM Accent SR RF pacemaker    Family History  Problem Relation Age of Onset  . Anesthesia problems Neg Hx   . Hypotension Neg Hx   . Malignant hyperthermia Neg Hx   . Pseudochol deficiency Neg Hx     History  Substance Use Topics  . Smoking status: Former Smoker -- 0.25 packs/day for 15 years    Quit date: 12/24/1994  . Smokeless tobacco: Not on file  . Alcohol Use: No      Review of Systems  Constitutional: Negative for fever and chills.  Respiratory: Negative for shortness of breath.   Gastrointestinal: Negative for nausea, vomiting, abdominal pain and abdominal distention.  Genitourinary: Positive for decreased urine volume. Negative for dysuria, frequency, flank pain, scrotal swelling, penile pain and testicular pain.  Neurological: Negative for dizziness and weakness.  All other systems reviewed and are negative.    Allergies  Review of patient's allergies indicates no known allergies.  Home Medications   Current Outpatient Rx  Name  Route  Sig  Dispense  Refill  . aspirin EC 81 MG EC tablet   Oral   Take 1 tablet (81 mg total) by mouth daily.   30 tablet   3   . atorvastatin (LIPITOR) 10 MG tablet  Oral   Take 10 mg by mouth daily.          . carvedilol (COREG) 6.25 MG tablet   Oral   Take 1 tablet (6.25 mg total) by mouth 2 (two) times daily with a meal.   60 tablet   3   . ciprofloxacin (CIPRO) 500 MG tablet   Oral   Take 1 tablet (500 mg total) by mouth 2 (two) times daily.   13 tablet   0   . docusate sodium (COLACE) 100 MG capsule   Oral   Take 100 mg by mouth at bedtime as needed. For constipation         . furosemide (LASIX) 40 MG tablet   Oral   Take 40 mg by mouth daily.         . hydrALAZINE (APRESOLINE) 25 MG tablet   Oral   Take 25 mg by mouth 2 (two) times daily.         . isosorbide dinitrate (ISORDIL)  20 MG tablet   Oral   Take 20 mg by mouth 2 (two) times daily.         Marland Kitchen lubiprostone (AMITIZA) 24 MCG capsule   Oral   Take 24 mcg by mouth 2 (two) times daily with a meal.         . Multiple Vitamin (MULTIVITAMIN WITH MINERALS) TABS   Oral   Take 1 tablet by mouth daily.         . pantoprazole (PROTONIX) 40 MG tablet   Oral   Take 40 mg by mouth 2 (two) times daily.         . ramipril (ALTACE) 2.5 MG capsule   Oral   Take 1 capsule (2.5 mg total) by mouth daily.   30 capsule   3   . spironolactone (ALDACTONE) 25 MG tablet   Oral   Take 25 mg by mouth daily.         . Tamsulosin HCl (FLOMAX) 0.4 MG CAPS   Oral   Take 0.4 mg by mouth daily.            BP 107/62  Pulse 59  Temp(Src) 97.9 F (36.6 C) (Oral)  Resp 15  SpO2 99%  Physical Exam  Constitutional: He is oriented to person, place, and time. He appears well-developed and well-nourished.  HENT:  Head: Normocephalic.  Mouth/Throat: Mucous membranes are dry.  Eyes: Pupils are equal, round, and reactive to light.  Neck: Normal range of motion.  Cardiovascular: Normal rate and regular rhythm.   Pulmonary/Chest: Effort normal.  Abdominal: Soft. He exhibits no distension. There is no tenderness.  Musculoskeletal: Normal range of motion.  Neurological: He is alert and oriented to person, place, and time.  Skin: Skin is warm and dry. There is pallor.    ED Course  Procedures (including critical care time)  Labs Reviewed  URINALYSIS, ROUTINE W REFLEX MICROSCOPIC - Abnormal; Notable for the following:    APPearance CLOUDY (*)    Leukocytes, UA MODERATE (*)    All other components within normal limits  CBC - Abnormal; Notable for the following:    Hemoglobin 12.4 (*)    HCT 34.4 (*)    MCV 71.2 (*)    MCH 25.7 (*)    RDW 16.8 (*)    All other components within normal limits  URINE MICROSCOPIC-ADD ON - Abnormal; Notable for the following:    Squamous Epithelial / LPF FEW (*)    Bacteria, UA  MANY (*)    Casts HYALINE CASTS (*)    All other components within normal limits  POCT I-STAT, CHEM 8 - Abnormal; Notable for the following:    Chloride 115 (*)    All other components within normal limits  URINE CULTURE   No results found.   1. UTI (lower urinary tract infection)       MDM   Pateint started on Po Cipro gently hydrated will be DC home with RX for same and FU PCP       Arman Filter, NP 03/17/13 [phone]

## 2013-03-16 NOTE — ED Notes (Addendum)
Pt reported for the past few days he's been having trouble using the bathroom.  He states that he has several "drips of urine but its not like it should be".  His appetite has decreased.  His last BM was 2 days ago.

## 2013-03-17 LAB — POCT I-STAT, CHEM 8
BUN: 17 mg/dL (ref 6–23)
Calcium, Ion: 1.29 mmol/L (ref 1.13–1.30)
Chloride: 115 mEq/L — ABNORMAL HIGH (ref 96–112)
Creatinine, Ser: 0.9 mg/dL (ref 0.50–1.35)
Glucose, Bld: 91 mg/dL (ref 70–99)
HCT: 40 % (ref 39.0–52.0)

## 2013-03-17 LAB — URINALYSIS, ROUTINE W REFLEX MICROSCOPIC
Glucose, UA: NEGATIVE mg/dL
Hgb urine dipstick: NEGATIVE
Protein, ur: NEGATIVE mg/dL
pH: 5 (ref 5.0–8.0)

## 2013-03-17 LAB — URINE MICROSCOPIC-ADD ON

## 2013-03-17 MED ORDER — CIPROFLOXACIN HCL 500 MG PO TABS
500.0000 mg | ORAL_TABLET | Freq: Once | ORAL | Status: AC
  Administered 2013-03-17: 500 mg via ORAL
  Filled 2013-03-17: qty 1

## 2013-03-17 MED ORDER — SODIUM CHLORIDE 0.9 % IV BOLUS (SEPSIS)
500.0000 mL | Freq: Once | INTRAVENOUS | Status: AC
  Administered 2013-03-17: 500 mL via INTRAVENOUS

## 2013-03-17 MED ORDER — CIPROFLOXACIN HCL 500 MG PO TABS: 500.0000 mg | ORAL_TABLET | Freq: Two times a day (BID) | ORAL | Status: DC

## 2013-03-17 NOTE — ED Notes (Signed)
Discussed how to take antibiotic with patient.  Emphised the need to find the whole course of antibiotic.

## 2013-03-17 NOTE — ED Provider Notes (Signed)
Medical screening examination/treatment/procedure(s) were conducted as a shared visit with non-physician practitioner(s) and myself.  I personally evaluated the patient during the encounter  Mild suprapubic tenderness. Sleeping but arousable.   Hanley Seamen, MD 03/17/13 [phone]

## 2013-03-18 LAB — URINE CULTURE: Colony Count: 75000

## 2013-03-19 ENCOUNTER — Telehealth (HOSPITAL_COMMUNITY): Payer: Self-pay | Admitting: Emergency Medicine

## 2013-03-19 NOTE — ED Notes (Signed)
Positive urnc- chart sent to edp for review.  

## 2013-03-21 ENCOUNTER — Telehealth (HOSPITAL_COMMUNITY): Payer: Self-pay | Admitting: Emergency Medicine

## 2013-03-21 NOTE — ED Notes (Signed)
Rx called in to [facility] on [address] by [address].

## 2013-04-27 IMAGING — CR DG CHEST 1V PORT
2 series · 2 of 2 positions shown · non-contrast
Comparison: CTA chest 11/11/2012.  Two-view chest x-ray 08/27/2012,
10/05/2011.

CLINICAL DATA: Burning chest pain over the past 3-4 days.

PORTABLE CHEST - 1 VIEW

[AP (1 of 2)]
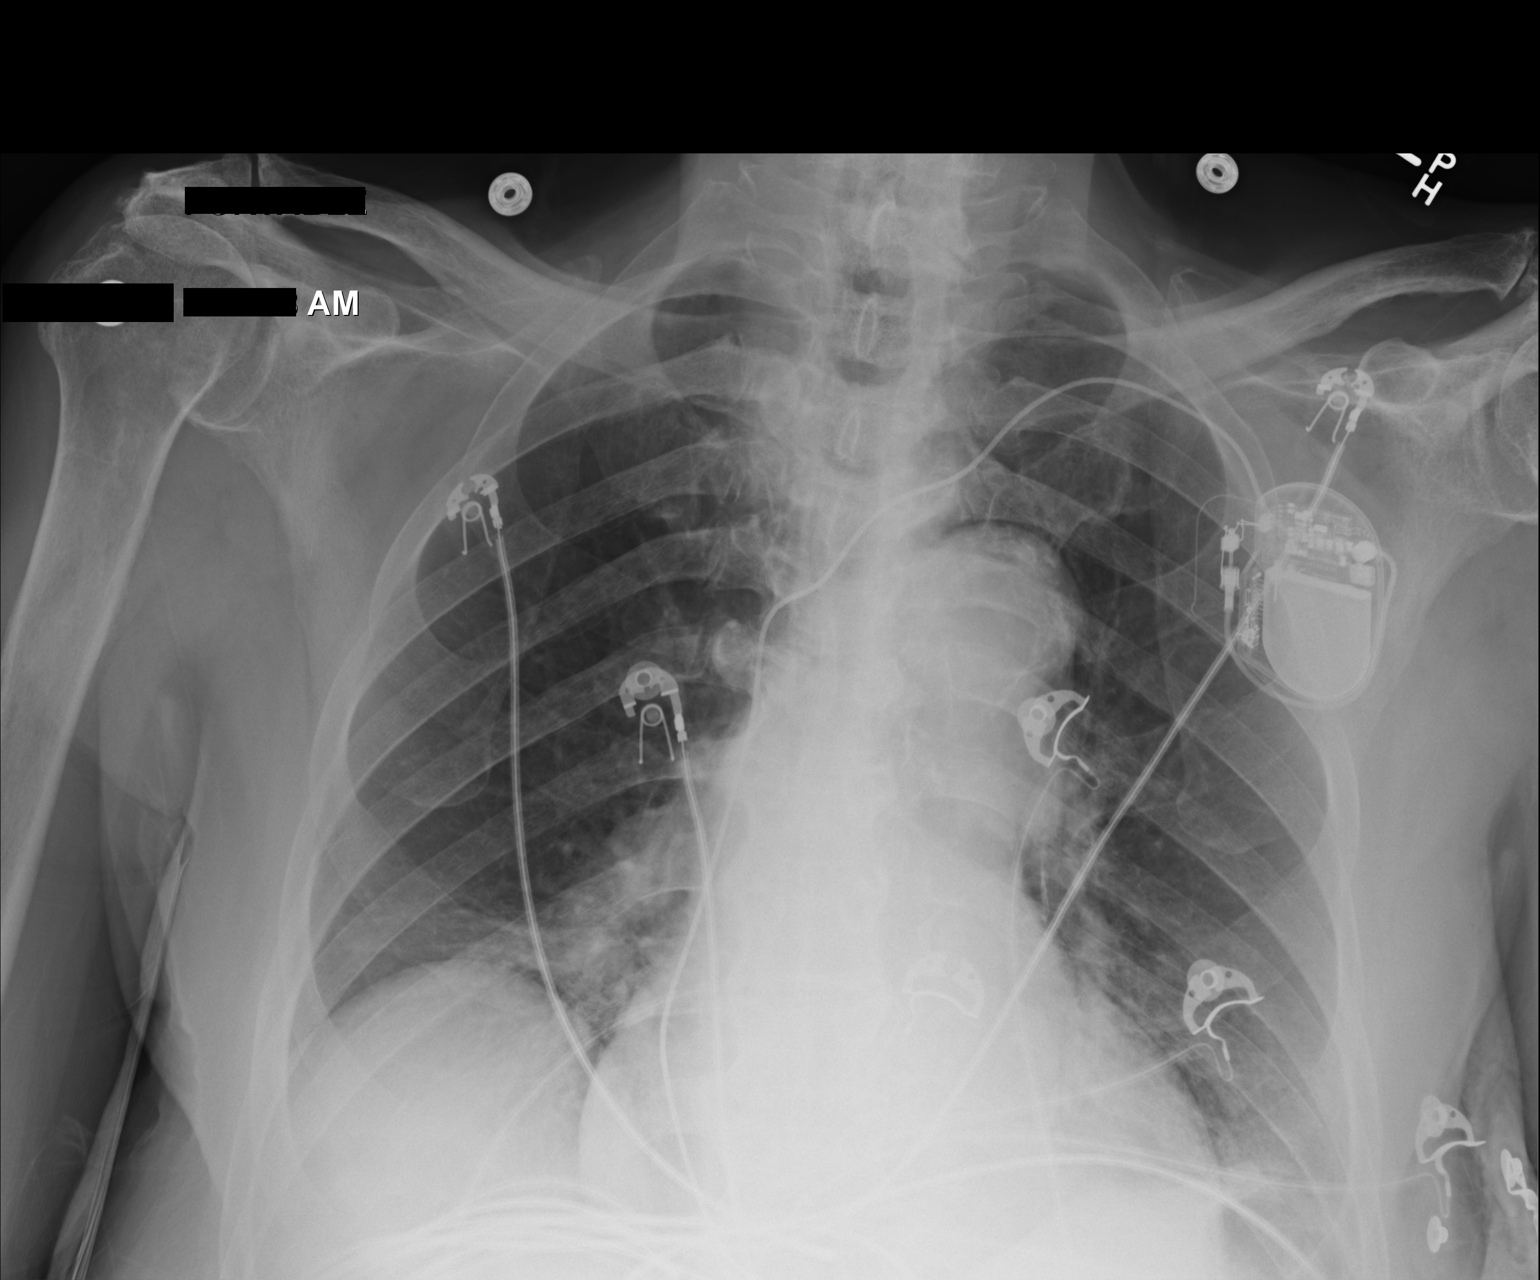

[AP (2 of 2)]
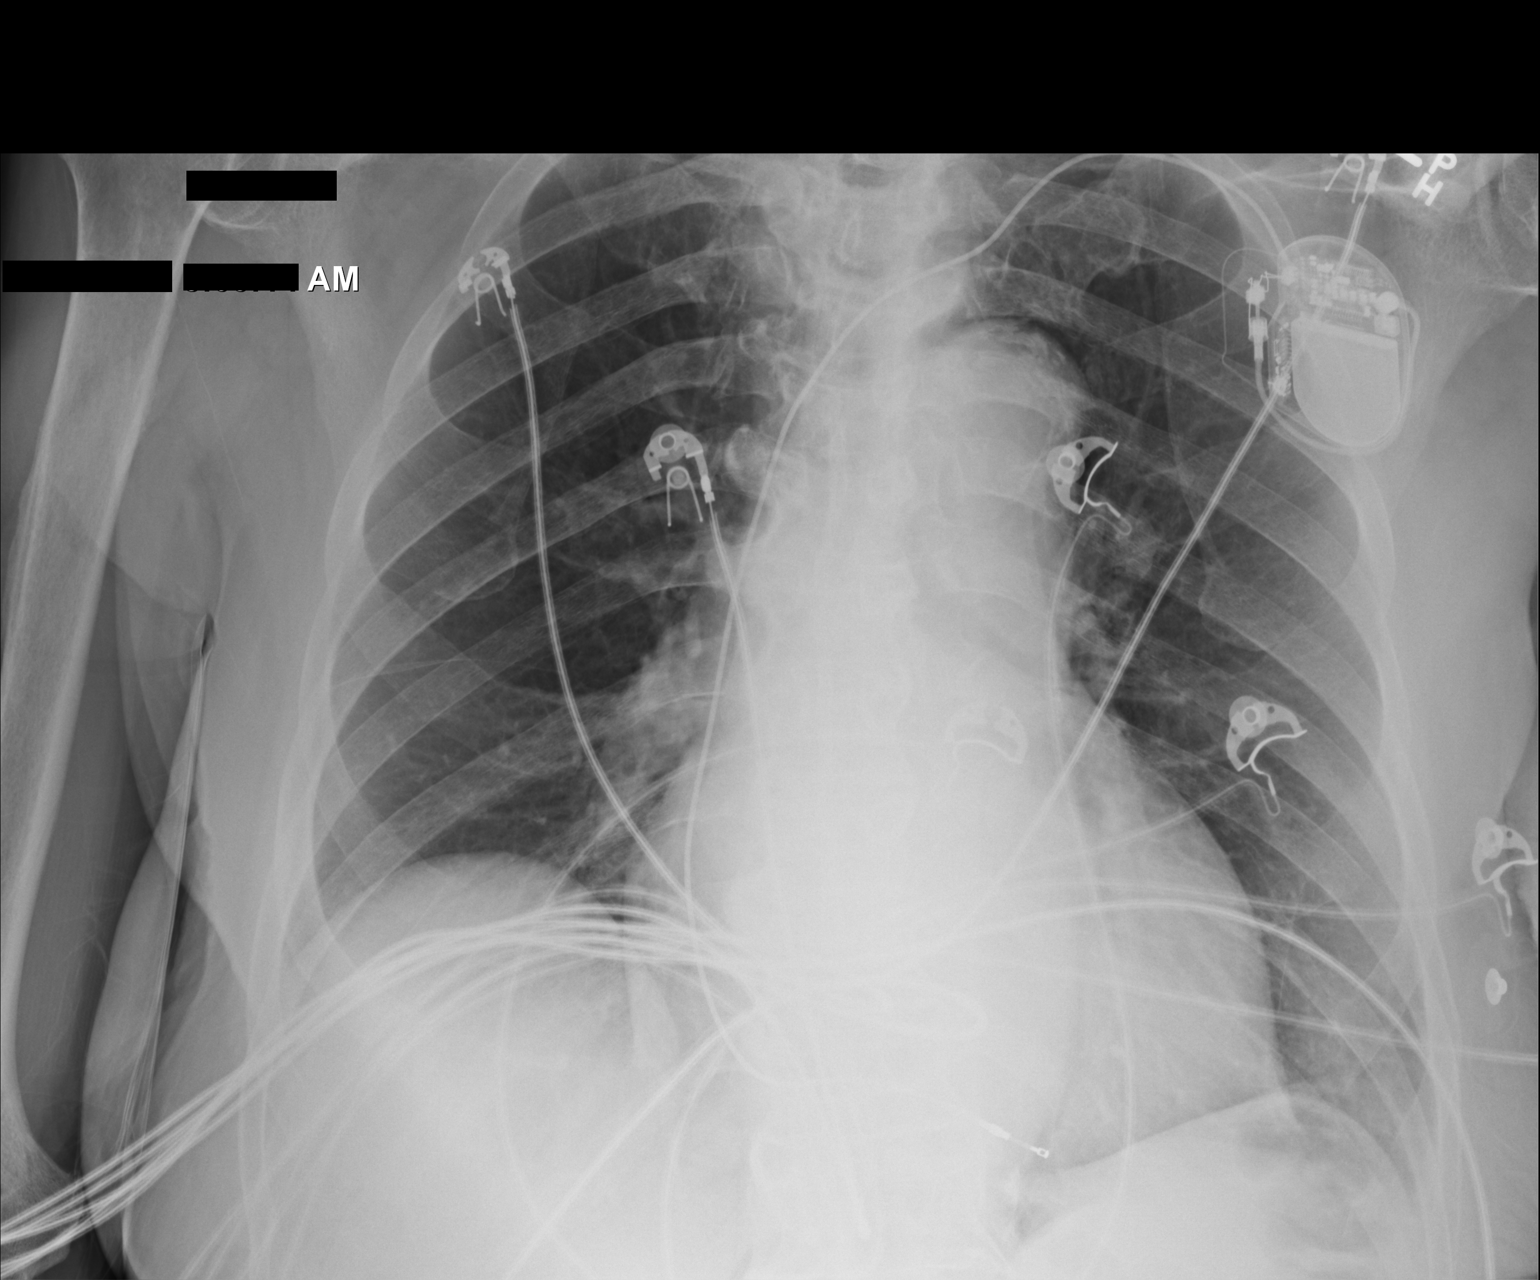

[2 of 2 positions shown; findings below may reference images not displayed]

FINDINGS: Cardiac silhouette moderately enlarged but stable.  Left
subclavian single lead transvenous pacemaker unchanged and intact.
Thoracic aorta atherosclerotic, unchanged.  Hilar and mediastinal
contours otherwise unremarkable.  Stable chronic elevation of the
right hemidiaphragm with associated mild chronic atelectasis and/or
scarring in the right base.  Lungs otherwise clear.  Pulmonary
vascularity normal.
IMPRESSION: Stable cardiomegaly.  Stable chronic elevation of the right
hemidiaphragm with associated chronic scar/atelectasis at the right
base.  No acute cardiopulmonary disease.

## 2013-05-25 ENCOUNTER — Other Ambulatory Visit: Payer: Self-pay | Admitting: Cardiology

## 2013-05-25 DIAGNOSIS — M549 Dorsalgia, unspecified: Secondary | ICD-10-CM

## 2013-05-28 ENCOUNTER — Ambulatory Visit
Admission: RE | Admit: 2013-05-28 | Discharge: 2013-05-28 | Disposition: A | Discharge status: Home / Self Care | Payer: Medicare Other | Source: Ambulatory Visit | Attending: Cardiology | Admitting: Cardiology

## 2013-05-28 DIAGNOSIS — M549 Dorsalgia, unspecified: Secondary | ICD-10-CM

## 2013-05-28 MED ORDER — METHYLPREDNISOLONE ACETATE 40 MG/ML INJ SUSP (RADIOLOG
120.0000 mg | Freq: Once | INTRAMUSCULAR | Status: AC
  Administered 2013-05-28: 120 mg via EPIDURAL

## 2013-05-28 MED ORDER — IOHEXOL 180 MG/ML  SOLN
1.0000 mL | Freq: Once | INTRAMUSCULAR | Status: AC | PRN
  Administered 2013-05-28: 1 mL via EPIDURAL

## 2013-07-28 ENCOUNTER — Other Ambulatory Visit: Payer: Self-pay | Admitting: Cardiology

## 2013-07-28 DIAGNOSIS — M545 Low back pain: Secondary | ICD-10-CM

## 2013-07-30 ENCOUNTER — Ambulatory Visit
Admission: RE | Admit: 2013-07-30 | Discharge: 2013-07-30 | Disposition: A | Discharge status: Home / Self Care | Payer: Medicare Other | Source: Ambulatory Visit | Attending: Cardiology | Admitting: Cardiology

## 2013-07-30 DIAGNOSIS — M545 Low back pain: Secondary | ICD-10-CM

## 2013-07-30 MED ORDER — IOHEXOL 180 MG/ML  SOLN
1.0000 mL | Freq: Once | INTRAMUSCULAR | Status: AC | PRN
  Administered 2013-07-30: 1 mL via EPIDURAL

## 2013-07-30 MED ORDER — METHYLPREDNISOLONE ACETATE 40 MG/ML INJ SUSP (RADIOLOG
120.0000 mg | Freq: Once | INTRAMUSCULAR | Status: AC
  Administered 2013-07-30: 120 mg via EPIDURAL

## 2013-09-09 ENCOUNTER — Encounter: Payer: Self-pay | Admitting: Internal Medicine

## 2013-09-09 ENCOUNTER — Ambulatory Visit (INDEPENDENT_AMBULATORY_CARE_PROVIDER_SITE_OTHER): Payer: Medicare Other | Admitting: Internal Medicine

## 2013-09-09 VITALS — BP 101/70 | HR 67 | Ht 73.0 in | Wt 146.0 lb

## 2013-09-09 DIAGNOSIS — I498 Other specified cardiac arrhythmias: Secondary | ICD-10-CM

## 2013-09-09 DIAGNOSIS — I4891 Unspecified atrial fibrillation: Secondary | ICD-10-CM

## 2013-09-09 DIAGNOSIS — Z950 Presence of cardiac pacemaker: Secondary | ICD-10-CM

## 2013-09-09 DIAGNOSIS — R001 Bradycardia, unspecified: Secondary | ICD-10-CM

## 2013-09-09 LAB — PACEMAKER DEVICE OBSERVATION
BATTERY VOLTAGE: 2.993 V
BMOD-0001RV: 2
BMOD-0002RV: 8
DEVICE MODEL PM: 7230364
RV LEAD AMPLITUDE: 12 mv
RV LEAD IMPEDENCE PM: 587.5 Ohm
VENTRICULAR PACING PM: 76

## 2013-09-09 NOTE — Patient Instructions (Addendum)
Your physician wants you to follow-up in: 12 months with Dr Jacquiline Doe will receive a reminder letter in the mail two months in advance. If you don't receive a letter, please call our office to schedule the follow-up appointment.    Remote monitoring is used to monitor your Pacemaker or ICD from home. This monitoring reduces the number of office visits required to check your device to one time per year. It allows [place] to keep an eye on the functioning of your device to ensure it is working properly. You are scheduled for a device check from home on 12/14/13. You may send your transmission at any time that day. If you have a wireless device, the transmission will be sent automatically. After your physician reviews your transmission, you will receive a postcard with your next transmission date.

## 2013-09-09 NOTE — Progress Notes (Signed)
PCP: Robynn Pane, MD Primary Cardiologist:  [name] [name] is a 79 y.o. male who presents today for routine electrophysiology followup.  Since his last visit, the patient reports doing very well.  Today, he denies symptoms of palpitations, chest pain, shortness of breath,  lower extremity edema,  presyncope, or syncope.  The patient is otherwise without complaint today.   Past Medical History  Diagnosis Date  . Hypertension   . CHF (congestive heart failure)   . GERD (gastroesophageal reflux disease)   . Recurrent upper respiratory infection (URI)     recent cold- treating /w OTC  . Arthritis     "legs are stiff" , had spinal injections for pain relieve 3-4 yrs. ago  . Atrial fibrillation-permanent   . Constipation   . Depression   . Nonischemic/ ischemic cardiomyopathy     Single-vessel coronary disease catheterization 2004-Myoview showing inferior wall perfusion defect Myoview 2006  . Subdural hematoma     Prior evacuation  . Cataract   . Bradycardia    Past Surgical History  Procedure Laterality Date  . Evacuation of subdural hematoma    . Eye surgery      cataract extracted in L eye  . Hernia repair      R side inguinal repair   . Brain surgery      2002  . Tonsillectomy      1957  . Cataract extraction w/phaco  01/30/2012    Procedure: CATARACT EXTRACTION PHACO AND INTRAOCULAR LENS PLACEMENT (IOC);  Surgeon: Shade Flood, MD;  Location: [facility] OR;  Service: Ophthalmology;  Laterality: Right;  . Pars plana vitrectomy  07/30/2012    Procedure: PARS PLANA VITRECTOMY WITH 23 GAUGE;  Surgeon: Shade Flood, MD;  Location: [facility] OR;  Service: Ophthalmology;  Laterality: Right;  Insertion of Silicone Oil  . Gas/fluid exchange  07/30/2012    Procedure: GAS/FLUID EXCHANGE;  Surgeon: Shade Flood, MD;  Location: [facility] OR;  Service: Ophthalmology;  Laterality: Right;  . Pacemaker insertion  08/26/12    SJM Accent SR RF pacemaker    Current Outpatient Prescriptions  Medication Sig  Dispense Refill  . atorvastatin (LIPITOR) 10 MG tablet Take 10 mg by mouth daily.       . furosemide (LASIX) 40 MG tablet Take 40 mg by mouth daily.      . hydrALAZINE (APRESOLINE) 25 MG tablet Take 25 mg by mouth 2 (two) times daily.      . isosorbide dinitrate (ISORDIL) 20 MG tablet Take 20 mg by mouth 2 (two) times daily.      . Multiple Vitamin (MULTIVITAMIN WITH MINERALS) TABS Take 1 tablet by mouth daily.      Marland Kitchen spironolactone (ALDACTONE) 25 MG tablet Take 25 mg by mouth daily.      . Tamsulosin HCl (FLOMAX) 0.4 MG CAPS Take 0.4 mg by mouth daily.       . carvedilol (COREG) 6.25 MG tablet Take 1 tablet (6.25 mg total) by mouth 2 (two) times daily with a meal.  60 tablet  3   No current facility-administered medications for this visit.    Physical Exam: Filed Vitals:   09/09/13 0911  BP: 101/70  Pulse: 67  Height: 6\' 1"  (1.854 m)  Weight: 146 lb (66.225 kg)    GEN- The patient is well appearing, alert and oriented x 3 today.   Head- normocephalic, atraumatic Eyes-  Sclera clear, conjunctiva pink Ears- hearing intact Oropharynx- clear Lungs- Clear to ausculation bilaterally, normal work of breathing Chest-  pacemaker pocket is well healed Heart- Regular rate and rhythm (paced) GI- soft, NT, ND, + BS Extremities- no clubbing, cyanosis, or edema  Pacemaker interrogation- reviewed in detail today,  See PACEART report  Assessment and Plan:  1. Bradycardia Normal pacemaker function See Pace Art report No changes today   2. Permanent afib He takes ASA.  His CHADS2VASC score is at least 4.  AVERROES data would suggest that eliquis is as safe as ASA with significant reduction in stroke.  I have encouraged him to consider eliquis.  He would like to discuss this further with Dr Sharyn Lull before changing.  He has scheduled followup next month  No changes today  Return in 12 months Merlin Follow-up with Dr Sharyn Lull as scheduled

## 2013-09-29 ENCOUNTER — Encounter: Payer: Self-pay | Admitting: Internal Medicine

## 2013-10-11 ENCOUNTER — Encounter (HOSPITAL_COMMUNITY): Payer: Self-pay | Admitting: Emergency Medicine

## 2013-10-11 ENCOUNTER — Emergency Department (HOSPITAL_COMMUNITY)
Admission: EM | Admit: 2013-10-11 | Discharge: 2013-10-11 | Disposition: A | Discharge status: Home / Self Care | Payer: Medicare Other | Attending: Emergency Medicine | Admitting: Emergency Medicine

## 2013-10-11 DIAGNOSIS — Z87891 Personal history of nicotine dependence: Secondary | ICD-10-CM | POA: Insufficient documentation

## 2013-10-11 DIAGNOSIS — I509 Heart failure, unspecified: Secondary | ICD-10-CM | POA: Insufficient documentation

## 2013-10-11 DIAGNOSIS — Z8709 Personal history of other diseases of the respiratory system: Secondary | ICD-10-CM | POA: Insufficient documentation

## 2013-10-11 DIAGNOSIS — Z79899 Other long term (current) drug therapy: Secondary | ICD-10-CM | POA: Insufficient documentation

## 2013-10-11 DIAGNOSIS — I4891 Unspecified atrial fibrillation: Secondary | ICD-10-CM | POA: Insufficient documentation

## 2013-10-11 DIAGNOSIS — I10 Essential (primary) hypertension: Secondary | ICD-10-CM | POA: Insufficient documentation

## 2013-10-11 DIAGNOSIS — Z8669 Personal history of other diseases of the nervous system and sense organs: Secondary | ICD-10-CM | POA: Insufficient documentation

## 2013-10-11 DIAGNOSIS — Z8739 Personal history of other diseases of the musculoskeletal system and connective tissue: Secondary | ICD-10-CM | POA: Insufficient documentation

## 2013-10-11 DIAGNOSIS — Z8659 Personal history of other mental and behavioral disorders: Secondary | ICD-10-CM | POA: Insufficient documentation

## 2013-10-11 DIAGNOSIS — R634 Abnormal weight loss: Secondary | ICD-10-CM | POA: Insufficient documentation

## 2013-10-11 DIAGNOSIS — K219 Gastro-esophageal reflux disease without esophagitis: Secondary | ICD-10-CM | POA: Insufficient documentation

## 2013-10-11 LAB — CBC WITH DIFFERENTIAL/PLATELET
Basophils Absolute: 0 10*3/uL (ref 0.0–0.1)
Basophils Relative: 1 % (ref 0–1)
Eosinophils Relative: 6 % — ABNORMAL HIGH (ref 0–5)
HCT: 33.6 % — ABNORMAL LOW (ref 39.0–52.0)
Lymphocytes Relative: 50 % — ABNORMAL HIGH (ref 12–46)
MCHC: 35.7 g/dL (ref 30.0–36.0)
Monocytes Absolute: 0.4 10*3/uL (ref 0.1–1.0)
Neutro Abs: 1.1 10*3/uL — ABNORMAL LOW (ref 1.7–7.7)
Platelets: 216 10*3/uL (ref 150–400)
RDW: 16.1 % — ABNORMAL HIGH (ref 11.5–15.5)
WBC: 3.5 10*3/uL — ABNORMAL LOW (ref 4.0–10.5)

## 2013-10-11 LAB — POCT I-STAT TROPONIN I: Troponin i, poc: 0.02 ng/mL (ref 0.00–0.08)

## 2013-10-11 LAB — URINALYSIS, ROUTINE W REFLEX MICROSCOPIC
Glucose, UA: NEGATIVE mg/dL
Hgb urine dipstick: NEGATIVE
Specific Gravity, Urine: 1.018 (ref 1.005–1.030)
pH: 7 (ref 5.0–8.0)

## 2013-10-11 LAB — URINE MICROSCOPIC-ADD ON

## 2013-10-11 LAB — COMPREHENSIVE METABOLIC PANEL
ALT: 9 U/L (ref 0–53)
AST: 22 U/L (ref 0–37)
Albumin: 3.7 g/dL (ref 3.5–5.2)
CO2: 22 mEq/L (ref 19–32)
Calcium: 9.1 mg/dL (ref 8.4–10.5)
Chloride: 103 mEq/L (ref 96–112)
Creatinine, Ser: 1.3 mg/dL (ref 0.50–1.35)
Sodium: 136 mEq/L (ref 135–145)

## 2013-10-11 MED ORDER — GI COCKTAIL ~~LOC~~
30.0000 mL | Freq: Once | ORAL | Status: AC
  Administered 2013-10-11: 30 mL via ORAL
  Filled 2013-10-11: qty 30

## 2013-10-11 MED ORDER — PANTOPRAZOLE SODIUM 20 MG PO TBEC: 20.0000 mg | DELAYED_RELEASE_TABLET | Freq: Every day | ORAL | Status: DC

## 2013-10-11 MED ORDER — RANITIDINE HCL 150 MG PO CAPS: 150.0000 mg | ORAL_CAPSULE | Freq: Every day | ORAL | Status: DC

## 2013-10-11 NOTE — ED Provider Notes (Signed)
79 year old male comes in because of recurrent episodes of epigastric burning. Pain tends to come on at night and is without radiation. There is no associated nausea or vomiting or dyspnea. He has seen physicians in the past and then given a GI cocktail which has given him relief. He he also notes approximately 10 pound weight loss over the last 2-3 months. This is associated with decreased appetite. On exam, he appears mildly cachectic. Lungs are clear and heart has regular rate and rhythm. Abdomen is soft and nontender. There no masses or hepatosplenomegaly. Old records are reviewed and he has a known abdominal aortic aneurysm and had a CT angiogram done 1 year ago which showed no significant pathology other than the aneurysm. He is not currently on an H2 blocker or proton pump inhibitor. ECG is unremarkable. Troponin will be checked as well as screening labs. He is likely to need outpatient workup. He will need to be placed on a proton pump inhibitor.   Date: 10/11/2013  Rate: 73  Rhythm: atrial fibrillation  QRS Axis: left  Intervals: normal  ST/T Wave abnormalities: nonspecific T wave changes  Conduction Disutrbances:left anterior fascicular block  Narrative Interpretation: Atrial fibrillation, left axis deviation, left anterior fascicular block, nonspecific T wave flattening. When compared with ECG of 12/21/2012, atrial fibrillation has replaced electronic ventricular paced rhythm.  Old EKG Reviewed: changes noted  Medical screening examination/treatment/procedure(s) were conducted as a shared visit with non-physician practitioner(s) and myself.  I personally evaluated the patient during the encounter   Dione Booze, MD 10/11/13 [phone]

## 2013-10-11 NOTE — ED Provider Notes (Signed)
CSN: [id number]     Arrival date & time 10/11/13  0555 History   First MD Initiated Contact with Patient 10/11/13 [phone]     Chief Complaint  Patient presents with  . Gastrophageal Reflux   (Consider location/radiation/quality/duration/timing/severity/associated sxs/prior Treatment) HPI Comments: Patient is an 79 year old male with history of hypertension, congestive heart failure, GERD, recurrent upper respiratory infection, arthritis, A. Fib, constipation, depression, subdural hematoma with evacuation, cataracts, bradycardia presents today with complaints of gradually worsening GERD. He reports that he has a burning sensation in his upper abdomen that is worse at night. He's been taking antacids with little relief. He has had this in the past and discussed this with his primary care physician. His primary care physician has told him to the light meals later in the day. He reports that his symptoms are worse when he eats heavier meals before bedtime. Generally in the day he eats sausage and grits for breakfast and then some chicken and [place] for lunch. He is concerned about a 10 pound weight loss over the past few months. He states that he is not as hungry as he usually is. He denies any associated shortness of breath, chest pain, diaphoresis, vomiting, diarrhea. He has not noticed any darkening of his stools or bright red blood per rectum. Last bowel movement was yesterday.  The history is provided by the patient. No language interpreter was used.    Past Medical History  Diagnosis Date  . Hypertension   . CHF (congestive heart failure)   . GERD (gastroesophageal reflux disease)   . Recurrent upper respiratory infection (URI)     recent cold- treating /w OTC  . Arthritis     "legs are stiff" , had spinal injections for pain relieve 3-4 yrs. ago  . Atrial fibrillation-permanent   . Constipation   . Depression   . Nonischemic/ ischemic cardiomyopathy     Single-vessel coronary disease  catheterization 2004-Myoview showing inferior wall perfusion defect Myoview 2006  . Subdural hematoma     Prior evacuation  . Cataract   . Bradycardia    Past Surgical History  Procedure Laterality Date  . Evacuation of subdural hematoma    . Eye surgery      cataract extracted in L eye  . Hernia repair      R side inguinal repair   . Brain surgery      2002  . Tonsillectomy      1957  . Cataract extraction w/phaco  01/30/2012    Procedure: CATARACT EXTRACTION PHACO AND INTRAOCULAR LENS PLACEMENT (IOC);  Surgeon: Shade Flood, MD;  Location: [facility] OR;  Service: Ophthalmology;  Laterality: Right;  . Pars plana vitrectomy  07/30/2012    Procedure: PARS PLANA VITRECTOMY WITH 23 GAUGE;  Surgeon: Shade Flood, MD;  Location: [facility] OR;  Service: Ophthalmology;  Laterality: Right;  Insertion of Silicone Oil  . Gas/fluid exchange  07/30/2012    Procedure: GAS/FLUID EXCHANGE;  Surgeon: Shade Flood, MD;  Location: [facility] OR;  Service: Ophthalmology;  Laterality: Right;  . Pacemaker insertion  08/26/12    SJM Accent SR RF pacemaker   Family History  Problem Relation Age of Onset  . Anesthesia problems Neg Hx   . Hypotension Neg Hx   . Malignant hyperthermia Neg Hx   . Pseudochol deficiency Neg Hx    History  Substance Use Topics  . Smoking status: Former Smoker -- 0.25 packs/day for 15 years    Quit date: 12/24/1994  . Smokeless tobacco: Not on  file  . Alcohol Use: No    Review of Systems  Constitutional: Positive for unexpected weight change. Negative for fever and chills.  Respiratory: Negative for shortness of breath.   Cardiovascular: Negative for chest pain.  Gastrointestinal: Positive for abdominal pain. Negative for nausea, vomiting, diarrhea and constipation.  All other systems reviewed and are negative.    Allergies  Review of patient's allergies indicates no known allergies.  Home Medications   Current Outpatient Rx  Name  Route  Sig  Dispense  Refill  . atorvastatin (LIPITOR)  10 MG tablet   Oral   Take 10 mg by mouth daily.          Marland Kitchen EXPIRED: carvedilol (COREG) 6.25 MG tablet   Oral   Take 1 tablet (6.25 mg total) by mouth 2 (two) times daily with a meal.   60 tablet   3   . furosemide (LASIX) 40 MG tablet   Oral   Take 40 mg by mouth daily.         . hydrALAZINE (APRESOLINE) 25 MG tablet   Oral   Take 25 mg by mouth 2 (two) times daily.         . isosorbide dinitrate (ISORDIL) 20 MG tablet   Oral   Take 20 mg by mouth 2 (two) times daily.         . Multiple Vitamin (MULTIVITAMIN WITH MINERALS) TABS   Oral   Take 1 tablet by mouth daily.         Marland Kitchen spironolactone (ALDACTONE) 25 MG tablet   Oral   Take 25 mg by mouth daily.         . Tamsulosin HCl (FLOMAX) 0.4 MG CAPS   Oral   Take 0.4 mg by mouth daily.           BP 101/64  Pulse 64  Temp(Src) 97.5 F (36.4 C) (Oral)  Resp 18  SpO2 98% Physical Exam  Nursing note and vitals reviewed. Constitutional: He is oriented to person, place, and time. He appears well-developed and well-nourished. No distress.  HENT:  Head: Normocephalic and atraumatic.  Right Ear: External ear normal.  Left Ear: External ear normal.  Nose: Nose normal.  Eyes: Conjunctivae are normal.  Neck: Normal range of motion. No tracheal deviation present.  Cardiovascular: Normal rate and normal heart sounds.  An irregular rhythm present.  Pulmonary/Chest: Effort normal and breath sounds normal. No stridor.  Abdominal: Soft. Bowel sounds are normal. He exhibits no distension. There is no tenderness. There is no rigidity and no guarding.  Musculoskeletal: Normal range of motion.  Neurological: He is alert and oriented to person, place, and time.  Skin: Skin is warm and dry. He is not diaphoretic.  Psychiatric: He has a normal mood and affect. His behavior is normal.    ED Course  Procedures (including critical care time) Labs Review Labs Reviewed  CBC WITH DIFFERENTIAL - Abnormal; Notable for the  following:    WBC 3.5 (*)    Hemoglobin 12.0 (*)    HCT 33.6 (*)    MCV 73.0 (*)    RDW 16.1 (*)    Neutrophils Relative % 32 (*)    Neutro Abs 1.1 (*)    Lymphocytes Relative 50 (*)    Eosinophils Relative 6 (*)    All other components within normal limits  COMPREHENSIVE METABOLIC PANEL - Abnormal; Notable for the following:    GFR calc non Af Amer 47 (*)    GFR  calc Af Amer 55 (*)    All other components within normal limits  URINALYSIS, ROUTINE W REFLEX MICROSCOPIC - Abnormal; Notable for the following:    APPearance CLOUDY (*)    Leukocytes, UA SMALL (*)    All other components within normal limits  URINE MICROSCOPIC-ADD ON - Abnormal; Notable for the following:    Casts HYALINE CASTS (*)    All other components within normal limits  LIPASE, BLOOD  POCT I-STAT TROPONIN I   Imaging Review No results found.  EKG Interpretation   None       MDM   1. GERD (gastroesophageal reflux disease)    Patient with known history of GERD. He reports a burning epigastric sensation that feels like his GERD that has been gradually worsening over time. No associated chest pain, shortness of breath, diaphoresis. Symptoms are resolved with GI cocktail. Abdomen nontender. Labs are unremarkable. He has an appointment with his primary care physician tomorrow at 145. No concern for acute abdomen, appendicitis, bowel obstruction, bowel perforation, cholecystitis, diverticulitis, or cardiac etiology of this pain. Patient would like to go home. Dr. Preston Fleeting evaluated this patient and agrees with plan. Return instructions given. Vital signs stable for discharge. Patient / Family / Caregiver informed of clinical course, understand medical decision-making process, and agree with plan.         Mora Bellman, PA-C 10/11/13 [phone]

## 2013-10-11 NOTE — ED Notes (Signed)
Pt reports acid reflux x 3days. Reports a history of GERD.

## 2013-12-07 ENCOUNTER — Encounter (HOSPITAL_COMMUNITY): Payer: Self-pay | Admitting: Emergency Medicine

## 2013-12-07 ENCOUNTER — Emergency Department (HOSPITAL_COMMUNITY): Payer: Medicare Other

## 2013-12-07 ENCOUNTER — Observation Stay (HOSPITAL_COMMUNITY)
Admission: EM | Admit: 2013-12-07 | Discharge: 2013-12-10 | Disposition: A | Discharge status: Home / Self Care | Payer: Medicare Other | Attending: Cardiology | Admitting: Cardiology

## 2013-12-07 DIAGNOSIS — K2970 Gastritis, unspecified, without bleeding: Secondary | ICD-10-CM

## 2013-12-07 DIAGNOSIS — I10 Essential (primary) hypertension: Secondary | ICD-10-CM | POA: Insufficient documentation

## 2013-12-07 DIAGNOSIS — I714 Abdominal aortic aneurysm, without rupture, unspecified: Secondary | ICD-10-CM | POA: Insufficient documentation

## 2013-12-07 DIAGNOSIS — Z7982 Long term (current) use of aspirin: Secondary | ICD-10-CM | POA: Insufficient documentation

## 2013-12-07 DIAGNOSIS — R630 Anorexia: Secondary | ICD-10-CM | POA: Insufficient documentation

## 2013-12-07 DIAGNOSIS — K219 Gastro-esophageal reflux disease without esophagitis: Secondary | ICD-10-CM | POA: Insufficient documentation

## 2013-12-07 DIAGNOSIS — F329 Major depressive disorder, single episode, unspecified: Secondary | ICD-10-CM | POA: Insufficient documentation

## 2013-12-07 DIAGNOSIS — I428 Other cardiomyopathies: Secondary | ICD-10-CM | POA: Insufficient documentation

## 2013-12-07 DIAGNOSIS — R1013 Epigastric pain: Secondary | ICD-10-CM | POA: Insufficient documentation

## 2013-12-07 DIAGNOSIS — Z87891 Personal history of nicotine dependence: Secondary | ICD-10-CM | POA: Insufficient documentation

## 2013-12-07 DIAGNOSIS — I959 Hypotension, unspecified: Principal | ICD-10-CM | POA: Insufficient documentation

## 2013-12-07 DIAGNOSIS — Z79899 Other long term (current) drug therapy: Secondary | ICD-10-CM | POA: Insufficient documentation

## 2013-12-07 DIAGNOSIS — R079 Chest pain, unspecified: Secondary | ICD-10-CM | POA: Diagnosis present

## 2013-12-07 DIAGNOSIS — M1710 Unilateral primary osteoarthritis, unspecified knee: Secondary | ICD-10-CM | POA: Insufficient documentation

## 2013-12-07 DIAGNOSIS — I509 Heart failure, unspecified: Secondary | ICD-10-CM | POA: Insufficient documentation

## 2013-12-07 DIAGNOSIS — E43 Unspecified severe protein-calorie malnutrition: Secondary | ICD-10-CM | POA: Insufficient documentation

## 2013-12-07 DIAGNOSIS — I6200 Nontraumatic subdural hemorrhage, unspecified: Secondary | ICD-10-CM | POA: Insufficient documentation

## 2013-12-07 DIAGNOSIS — K5900 Constipation, unspecified: Secondary | ICD-10-CM | POA: Insufficient documentation

## 2013-12-07 DIAGNOSIS — H269 Unspecified cataract: Secondary | ICD-10-CM | POA: Insufficient documentation

## 2013-12-07 DIAGNOSIS — I498 Other specified cardiac arrhythmias: Secondary | ICD-10-CM | POA: Insufficient documentation

## 2013-12-07 DIAGNOSIS — F3289 Other specified depressive episodes: Secondary | ICD-10-CM | POA: Insufficient documentation

## 2013-12-07 DIAGNOSIS — R0789 Other chest pain: Secondary | ICD-10-CM | POA: Insufficient documentation

## 2013-12-07 DIAGNOSIS — I4891 Unspecified atrial fibrillation: Secondary | ICD-10-CM | POA: Insufficient documentation

## 2013-12-07 DIAGNOSIS — Z950 Presence of cardiac pacemaker: Secondary | ICD-10-CM | POA: Insufficient documentation

## 2013-12-07 HISTORY — DX: Abdominal aortic aneurysm, without rupture: I71.4

## 2013-12-07 HISTORY — DX: Diverticulosis of intestine, part unspecified, without perforation or abscess without bleeding: K57.90

## 2013-12-07 HISTORY — DX: Abdominal aortic aneurysm, without rupture, unspecified: I71.40

## 2013-12-07 LAB — CBC WITH DIFFERENTIAL/PLATELET
Eosinophils Absolute: 0.1 10*3/uL (ref 0.0–0.7)
HCT: 32.7 % — ABNORMAL LOW (ref 39.0–52.0)
Hemoglobin: 11.1 g/dL — ABNORMAL LOW (ref 13.0–17.0)
Lymphs Abs: 1.5 10*3/uL (ref 0.7–4.0)
MCH: 25.4 pg — ABNORMAL LOW (ref 26.0–34.0)
Monocytes Relative: 11 % (ref 3–12)
Neutrophils Relative %: 31 % — ABNORMAL LOW (ref 43–77)
RBC: 4.37 MIL/uL (ref 4.22–5.81)

## 2013-12-07 LAB — COMPREHENSIVE METABOLIC PANEL
ALT: 10 U/L (ref 0–53)
ALT: 11 U/L (ref 0–53)
AST: 17 U/L (ref 0–37)
AST: 20 U/L (ref 0–37)
Albumin: 3.3 g/dL — ABNORMAL LOW (ref 3.5–5.2)
Alkaline Phosphatase: 44 U/L (ref 39–117)
CO2: 15 mEq/L — ABNORMAL LOW (ref 19–32)
Calcium: 8.7 mg/dL (ref 8.4–10.5)
Chloride: 111 mEq/L (ref 96–112)
Creatinine, Ser: 1.19 mg/dL (ref 0.50–1.35)
GFR calc Af Amer: 57 mL/min — ABNORMAL LOW (ref >90)
GFR calc non Af Amer: 53 mL/min — ABNORMAL LOW (ref >90)
Sodium: 137 mEq/L (ref 135–145)
Sodium: 138 mEq/L (ref 135–145)
Total Bilirubin: 0.5 mg/dL (ref 0.3–1.2)
Total Protein: 6.3 g/dL (ref 6.0–8.3)

## 2013-12-07 LAB — URINALYSIS W MICROSCOPIC + REFLEX CULTURE
Glucose, UA: NEGATIVE mg/dL
Ketones, ur: NEGATIVE mg/dL
Nitrite: NEGATIVE
Protein, ur: NEGATIVE mg/dL
pH: 5 (ref 5.0–8.0)

## 2013-12-07 LAB — PROTIME-INR: INR: 1.25 (ref 0.00–1.49)

## 2013-12-07 LAB — TROPONIN I: Troponin I: <0.3 ng/mL (ref <0.30)

## 2013-12-07 LAB — APTT: aPTT: 36 seconds (ref 24–37)

## 2013-12-07 LAB — TSH: TSH: 1.208 u[IU]/mL (ref 0.350–4.500)

## 2013-12-07 LAB — OCCULT BLOOD, POC DEVICE: Fecal Occult Bld: NEGATIVE

## 2013-12-07 MED ORDER — RAMIPRIL 2.5 MG PO CAPS
2.5000 mg | ORAL_CAPSULE | Freq: Every day | ORAL | Status: DC
  Filled 2013-12-07 (×4): qty 1

## 2013-12-07 MED ORDER — SODIUM CHLORIDE 0.9 % IV BOLUS (SEPSIS)
1000.0000 mL | Freq: Once | INTRAVENOUS | Status: AC
  Administered 2013-12-07: 1000 mL via INTRAVENOUS

## 2013-12-07 MED ORDER — ASPIRIN 300 MG RE SUPP
300.0000 mg | RECTAL | Status: AC
  Filled 2013-12-07: qty 1

## 2013-12-07 MED ORDER — ONDANSETRON HCL 4 MG/2ML IJ SOLN: 4.0000 mg | Freq: Four times a day (QID) | INTRAMUSCULAR | Status: DC | PRN

## 2013-12-07 MED ORDER — FAMOTIDINE 20 MG PO TABS
20.0000 mg | ORAL_TABLET | Freq: Two times a day (BID) | ORAL | Status: DC
  Administered 2013-12-07 – 2013-12-10 (×7): 20 mg via ORAL
  Filled 2013-12-07 (×8): qty 1

## 2013-12-07 MED ORDER — CARVEDILOL 3.125 MG PO TABS
3.1250 mg | ORAL_TABLET | Freq: Two times a day (BID) | ORAL | Status: DC
  Administered 2013-12-08: 3.125 mg via ORAL
  Filled 2013-12-07 (×8): qty 1

## 2013-12-07 MED ORDER — NITROGLYCERIN 0.4 MG SL SUBL: 0.4000 mg | SUBLINGUAL_TABLET | SUBLINGUAL | Status: DC | PRN

## 2013-12-07 MED ORDER — ASPIRIN EC 81 MG PO TBEC: 81.0000 mg | DELAYED_RELEASE_TABLET | Freq: Every day | ORAL | Status: DC

## 2013-12-07 MED ORDER — ASPIRIN 81 MG PO CHEW
324.0000 mg | CHEWABLE_TABLET | ORAL | Status: AC
  Administered 2013-12-07: 324 mg via ORAL
  Filled 2013-12-07: qty 4

## 2013-12-07 MED ORDER — SODIUM CHLORIDE 0.9 % IV SOLN
Freq: Once | INTRAVENOUS | Status: AC
  Administered 2013-12-07: 12:00:00 via INTRAVENOUS

## 2013-12-07 MED ORDER — HEPARIN SODIUM (PORCINE) 5000 UNIT/ML IJ SOLN
5000.0000 [IU] | Freq: Three times a day (TID) | INTRAMUSCULAR | Status: DC
  Administered 2013-12-07 – 2013-12-10 (×10): 5000 [IU] via SUBCUTANEOUS
  Filled 2013-12-07 (×12): qty 1

## 2013-12-07 MED ORDER — DOCUSATE SODIUM 100 MG PO CAPS
100.0000 mg | ORAL_CAPSULE | Freq: Every day | ORAL | Status: DC
  Administered 2013-12-09: 100 mg via ORAL
  Filled 2013-12-07 (×4): qty 1

## 2013-12-07 MED ORDER — ACETAMINOPHEN 325 MG PO TABS: 650.0000 mg | ORAL_TABLET | ORAL | Status: DC | PRN

## 2013-12-07 MED ORDER — GI COCKTAIL ~~LOC~~
30.0000 mL | Freq: Once | ORAL | Status: AC
  Administered 2013-12-07: 30 mL via ORAL
  Filled 2013-12-07: qty 30

## 2013-12-07 MED ORDER — PANTOPRAZOLE SODIUM 40 MG PO TBEC
40.0000 mg | DELAYED_RELEASE_TABLET | Freq: Every day | ORAL | Status: DC
  Administered 2013-12-07 – 2013-12-10 (×4): 40 mg via ORAL
  Filled 2013-12-07 (×3): qty 1

## 2013-12-07 MED ORDER — ASPIRIN EC 81 MG PO TBEC
81.0000 mg | DELAYED_RELEASE_TABLET | Freq: Every day | ORAL | Status: DC
  Administered 2013-12-08 – 2013-12-10 (×3): 81 mg via ORAL
  Filled 2013-12-07 (×4): qty 1

## 2013-12-07 MED ORDER — SPIRONOLACTONE 25 MG PO TABS
25.0000 mg | ORAL_TABLET | Freq: Every day | ORAL | Status: DC
  Filled 2013-12-07 (×2): qty 1

## 2013-12-07 NOTE — ED Notes (Signed)
Patient transported to X-ray 

## 2013-12-07 NOTE — H&P (Signed)
[name] [name] is an 79 y.o. male.   Chief Complaint: Chest burning/epigastric pain HPI: Patient is 79 year old male with past medical history significant for multiple medical problems i.e. nonischemic cardiomyopathy, history of recurrent congestive heart failure secondary to systolic dysfunction, hypertension, chronic atrial fibrillation, history of symptomatic bradycardia status post permanent pacemaker, GERD, history of subdural hematoma, history of abdominal aortic aneurysm, degenerative joint disease, came to the ER complaining of retrosternal chest burning associated with epigastric pain off and on for last few weeks associated with poor appetite. States chest burning gets better after taking TUMS denies any exertional chest pain although activity Limited. Denies any cough fever chills. Denies any urinary complaints. Denies any palpitation lightheadedness or syncope. Denies any history of PND orthopnea leg swelling. Patient was noted to have orthostatic hypotension in the ED received which 1 L total of normal saline still remains hypotensive with blood pressure in 90s to 100.  Past Medical History  Diagnosis Date  . Hypertension   . CHF (congestive heart failure)   . GERD (gastroesophageal reflux disease)   . Recurrent upper respiratory infection (URI)     recent cold- treating /w OTC  . Arthritis     "legs are stiff" , had spinal injections for pain relieve 3-4 yrs. ago  . Atrial fibrillation-permanent   . Constipation   . Depression   . Nonischemic/ ischemic cardiomyopathy     Single-vessel coronary disease catheterization 2004-Myoview showing inferior wall perfusion defect Myoview 2006  . Subdural hematoma     Prior evacuation  . Cataract   . Bradycardia   . Diverticulosis   . AAA (abdominal aortic aneurysm) without rupture     10/2012 CT scan: 4.3x4.6cm    Past Surgical History  Procedure Laterality Date  . Evacuation of subdural hematoma    . Eye surgery      cataract  extracted in L eye  . Hernia repair      R side inguinal repair   . Brain surgery      2002  . Tonsillectomy      1957  . Cataract extraction w/phaco  01/30/2012    Procedure: CATARACT EXTRACTION PHACO AND INTRAOCULAR LENS PLACEMENT (IOC);  Surgeon: Shade Flood, MD;  Location: [facility] OR;  Service: Ophthalmology;  Laterality: Right;  . Pars plana vitrectomy  07/30/2012    Procedure: PARS PLANA VITRECTOMY WITH 23 GAUGE;  Surgeon: Shade Flood, MD;  Location: [facility] OR;  Service: Ophthalmology;  Laterality: Right;  Insertion of Silicone Oil  . Gas/fluid exchange  07/30/2012    Procedure: GAS/FLUID EXCHANGE;  Surgeon: Shade Flood, MD;  Location: [facility] OR;  Service: Ophthalmology;  Laterality: Right;  . Pacemaker insertion  08/26/12    SJM Accent SR RF pacemaker    Family History  Problem Relation Age of Onset  . Anesthesia problems Neg Hx   . Hypotension Neg Hx   . Malignant hyperthermia Neg Hx   . Pseudochol deficiency Neg Hx    Social History:  reports that he quit smoking about 18 years ago. He does not have any smokeless tobacco history on file. He reports that he does not drink alcohol or use illicit drugs.  Allergies: No Known Allergies   (Not in a hospital admission)  Results for orders placed during the hospital encounter of 12/07/13 (from the past 48 hour(s))  COMPREHENSIVE METABOLIC PANEL     Status: Abnormal   Collection Time    12/07/13  9:00 AM      Result Value Range  Sodium 137  135 - 145 mEq/L   Potassium 4.7  3.5 - 5.1 mEq/L   Chloride 109  96 - 112 mEq/L   CO2 16 (*) 19 - 32 mEq/L   Glucose, Bld 81  70 - 99 mg/dL   BUN 21  6 - 23 mg/dL   Creatinine, Ser 7.82  0.50 - 1.35 mg/dL   Calcium 8.7  8.4 - 95.6 mg/dL   Total Protein 6.3  6.0 - 8.3 g/dL   Albumin 3.3 (*) 3.5 - 5.2 g/dL   AST 17  0 - 37 U/L   ALT 10  0 - 53 U/L   Alkaline Phosphatase 44  39 - 117 U/L   Total Bilirubin 0.5  0.3 - 1.2 mg/dL   GFR calc non Af Amer 50 (*) >90 mL/min   GFR calc Af Amer 57 (*) >90  mL/min   Comment: (NOTE)     The eGFR has been calculated using the CKD EPI equation.     This calculation has not been validated in all clinical situations.     eGFR's persistently <90 mL/min signify possible Chronic Kidney     Disease.  LIPASE, BLOOD     Status: None   Collection Time    12/07/13  9:00 AM      Result Value Range   Lipase 43  11 - 59 U/L  TROPONIN I     Status: None   Collection Time    12/07/13  9:00 AM      Result Value Range   Troponin I <0.30  <0.30 ng/mL   Comment:            Due to the release kinetics of cTnI,     a negative result within the first hours     of the onset of symptoms does not rule out     myocardial infarction with certainty.     If myocardial infarction is still suspected,     repeat the test at appropriate intervals.  LACTIC ACID, PLASMA     Status: None   Collection Time    12/07/13 10:51 AM      Result Value Range   Lactic Acid, Venous 1.5  0.5 - 2.2 mmol/L  URINALYSIS W MICROSCOPIC + REFLEX CULTURE     Status: Abnormal   Collection Time    12/07/13 10:55 AM      Result Value Range   Color, Urine YELLOW  YELLOW   APPearance CLEAR  CLEAR   Specific Gravity, Urine 1.018  1.005 - 1.030   pH 5.0  5.0 - 8.0   Glucose, UA NEGATIVE  NEGATIVE mg/dL   Hgb urine dipstick NEGATIVE  NEGATIVE   Bilirubin Urine NEGATIVE  NEGATIVE   Ketones, ur NEGATIVE  NEGATIVE mg/dL   Protein, ur NEGATIVE  NEGATIVE mg/dL   Urobilinogen, UA 0.2  0.0 - 1.0 mg/dL   Nitrite NEGATIVE  NEGATIVE   Leukocytes, UA SMALL (*) NEGATIVE   WBC, UA 0-2  <3 WBC/hpf   Squamous Epithelial / LPF RARE  RARE   Dg Chest 2 View  12/07/2013   CLINICAL DATA:  Epigastric pain.  EXAM: CHEST  2 VIEW  COMPARISON:  12/14/2012 chest x-ray.  11/11/2012 chest CT.  FINDINGS: Pacemaker enters from the left with the tip in the region of the right ventricle.  Elevated right hemidiaphragm.  Mild central pulmonary vascular prominence without pulmonary edema.  No segmental infiltrate or  pneumothorax.  Calcified tortuous aorta.  IMPRESSION:  Pacemaker enters from the left with the tip in the region of the right ventricle.  Elevated right hemidiaphragm.  Mild central pulmonary vascular prominence without pulmonary edema.  No segmental infiltrate or pneumothorax.  Calcified tortuous aorta.   Electronically Signed   By: Bridgett Larsson M.D.   On: 12/07/2013 09:56    Review of Systems  Constitutional: Negative for fever and chills.  HENT: Negative for hearing loss.   Respiratory: Negative for cough, hemoptysis, sputum production and shortness of breath.   Cardiovascular: Positive for chest pain. Negative for palpitations, orthopnea, claudication, leg swelling and PND.  Gastrointestinal: Positive for abdominal pain. Negative for nausea.  Genitourinary: Negative for urgency.  Neurological: Negative for dizziness, tingling and headaches.    Blood pressure 106/61, pulse 67, temperature 98.6 F (37 C), temperature source Oral, height 6\' 1"  (1.854 m), weight 64.411 kg (142 lb), SpO2 95.00%. Physical Exam  Constitutional: He is oriented to person, place, and time.  HENT:  Head: Normocephalic and atraumatic.  Eyes: Conjunctivae are normal. No scleral icterus.  Neck: Normal range of motion. Neck supple. No tracheal deviation present. No thyromegaly present.  Cardiovascular:  Irregularly irregular S1 and S2 soft there is soft systolic murmur no S3 gallop  Respiratory: Effort normal and breath sounds normal. No respiratory distress. He has no wheezes. He has no rales.  GI: Soft. Bowel sounds are normal. He exhibits no distension. There is tenderness (Mild epigastric tenderness noted no guarding no rebound). There is no rebound.  Musculoskeletal: He exhibits no edema and no tenderness.  Lymphadenopathy:    He has no cervical adenopathy.  Neurological: He is oriented to person, place, and time.     Assessment/Plan Atypical chest pain/epigastric pain rule out gastritis peptic ulcer disease  rule out GERD Nonischemic cardiomyopathy Status post orthostatic hypotension probably secondary to meds Compensated systolic heart failure History of symptomatic bradycardia status post permanent pacemaker GERD History of subdural hematoma Degenerative joint disease AA aneurysm Plan As per orders  [name] [name] 12/07/2013, 12:53 PM

## 2013-12-07 NOTE — ED Notes (Signed)
Pt c/o heartburn, sts its been going on for a few days now, worst when he wakes up and then starts to get better throughout the morning. Denies nausea/diaphoresis/sob. Nad, skin warm and dry, resp e/u.

## 2013-12-07 NOTE — ED Provider Notes (Signed)
CSN: [id number]     Arrival date & time 12/07/13  8295 History   First MD Initiated Contact with Patient 12/07/13 0827     Chief Complaint  Patient presents with  . Heartburn   (Consider location/radiation/quality/duration/timing/severity/associated sxs/prior Treatment) HPI Comments: 79 yo male with hx nonischemic cardiomyopathy, recurrent congestive heart failure secondary to systolic dysfunction, hypertension, chronic atrial fibrillation, symptomatic bradycardia status post permanent pacemaker, GERD, subdural hematoma, abdominal aortic aneurysm, degenerative joint disease, presents with c/o burning in the epigastric and central chest area for several days. Reports burning is more prominent upon awakening in the morning and improves throughout the day. Similar to previous episodes of GERD.  Reports seeing his PCP in October and had his meds changed around. Takes his PPI in the morning as opposed to the evening. Had reported a 10 pound weight loss at his last visit here in Oct, but has not had any further weight loss. Denies dyspnea, exertional fatigue, N/V/D, fever, chills.  The history is provided by the patient.    Past Medical History  Diagnosis Date  . Hypertension   . CHF (congestive heart failure)   . GERD (gastroesophageal reflux disease)   . Recurrent upper respiratory infection (URI)     recent cold- treating /w OTC  . Arthritis     "legs are stiff" , had spinal injections for pain relieve 3-4 yrs. ago  . Atrial fibrillation-permanent   . Constipation   . Depression   . Nonischemic/ ischemic cardiomyopathy     Single-vessel coronary disease catheterization 2004-Myoview showing inferior wall perfusion defect Myoview 2006  . Subdural hematoma     Prior evacuation  . Cataract   . Bradycardia   . Diverticulosis   . AAA (abdominal aortic aneurysm) without rupture     10/2012 CT scan: 4.3x4.6cm   Past Surgical History  Procedure Laterality Date  . Evacuation of subdural  hematoma    . Eye surgery      cataract extracted in L eye  . Hernia repair      R side inguinal repair   . Brain surgery      2002  . Tonsillectomy      1957  . Cataract extraction w/phaco  01/30/2012    Procedure: CATARACT EXTRACTION PHACO AND INTRAOCULAR LENS PLACEMENT (IOC);  Surgeon: Shade Flood, MD;  Location: [facility] OR;  Service: Ophthalmology;  Laterality: Right;  . Pars plana vitrectomy  07/30/2012    Procedure: PARS PLANA VITRECTOMY WITH 23 GAUGE;  Surgeon: Shade Flood, MD;  Location: [facility] OR;  Service: Ophthalmology;  Laterality: Right;  Insertion of Silicone Oil  . Gas/fluid exchange  07/30/2012    Procedure: GAS/FLUID EXCHANGE;  Surgeon: Shade Flood, MD;  Location: [facility] OR;  Service: Ophthalmology;  Laterality: Right;  . Pacemaker insertion  08/26/12    SJM Accent SR RF pacemaker   Family History  Problem Relation Age of Onset  . Anesthesia problems Neg Hx   . Hypotension Neg Hx   . Malignant hyperthermia Neg Hx   . Pseudochol deficiency Neg Hx    History  Substance Use Topics  . Smoking status: Former Smoker -- 0.25 packs/day for 15 years    Quit date: 12/24/1994  . Smokeless tobacco: Not on file  . Alcohol Use: No    Review of Systems  Constitutional: Negative for fever, chills, activity change and fatigue.  HENT: Negative for congestion, rhinorrhea and sore throat.   Eyes: Negative for visual disturbance.  Respiratory: Negative for cough and shortness  of breath.   Cardiovascular: Negative for palpitations and leg swelling.  Gastrointestinal: Positive for abdominal pain ( epigastric burning). Negative for nausea, vomiting, diarrhea, constipation and blood in stool.  Genitourinary: Negative for difficulty urinating.  Musculoskeletal: Negative for back pain and myalgias.  Skin: Negative for rash.  Neurological: Negative for headaches.  Hematological: Negative for adenopathy.  Psychiatric/Behavioral: Negative for agitation.    Allergies  Review of patient's allergies  indicates no known allergies.  Home Medications   Current Outpatient Rx  Name  Route  Sig  Dispense  Refill  . aspirin EC 81 MG tablet   Oral   Take 81 mg by mouth daily.         . carvedilol (COREG) 6.25 MG tablet   Oral   Take 6.25 mg by mouth 2 (two) times daily with a meal.         . docusate sodium (COLACE) 100 MG capsule   Oral   Take 100 mg by mouth at bedtime.         . hydrALAZINE (APRESOLINE) 25 MG tablet   Oral   Take 25 mg by mouth 2 (two) times daily.         . isosorbide dinitrate (ISORDIL) 20 MG tablet   Oral   Take 20 mg by mouth 2 (two) times daily.         . Multiple Vitamin (MULTIVITAMIN WITH MINERALS) TABS   Oral   Take 1 tablet by mouth daily.         . pantoprazole (PROTONIX) 20 MG tablet   Oral   Take 1 tablet (20 mg total) by mouth daily.   30 tablet   0   . ramipril (ALTACE) 2.5 MG capsule   Oral   Take 2.5 mg by mouth daily.         . ranitidine (ZANTAC) 150 MG capsule   Oral   Take 1 capsule (150 mg total) by mouth daily.   30 capsule   0   . spironolactone (ALDACTONE) 25 MG tablet   Oral   Take 25 mg by mouth daily.         . Tamsulosin HCl (FLOMAX) 0.4 MG CAPS   Oral   Take 0.4 mg by mouth daily.          Marland Kitchen EXPIRED: carvedilol (COREG) 6.25 MG tablet   Oral   Take 1 tablet (6.25 mg total) by mouth 2 (two) times daily with a meal.   60 tablet   3    BP 106/58  Pulse 62  Temp(Src) 98.6 F (37 C) (Oral)  Ht 6\' 1"  (1.854 m)  Wt 142 lb (64.411 kg)  BMI 18.74 kg/m2  SpO2 94% Physical Exam  Nursing note and vitals reviewed. Constitutional: He is oriented to person, place, and time. He appears well-developed and well-nourished.  HENT:  Head: Normocephalic and atraumatic.  Right Ear: External ear normal.  Left Ear: External ear normal.  Nose: Nose normal.  Mouth/Throat: Oropharynx is clear and moist.  Eyes: EOM are normal.  Neck: Normal range of motion. Neck supple.  Cardiovascular: Normal rate,  regular rhythm, normal heart sounds and intact distal pulses.   Pulmonary/Chest: Effort normal and breath sounds normal.  Abdominal: Soft. Bowel sounds are normal. There is no tenderness.  Musculoskeletal: Normal range of motion. He exhibits no edema.  Neurological: He is alert and oriented to person, place, and time.  Skin: Skin is warm and dry.  Psychiatric: He has a normal  mood and affect.    ED Course  Procedures (including critical care time) Labs Review Labs Reviewed  COMPREHENSIVE METABOLIC PANEL - Abnormal; Notable for the following:    CO2 16 (*)    Albumin 3.3 (*)    GFR calc non Af Amer 50 (*)    GFR calc Af Amer 57 (*)    All other components within normal limits  URINALYSIS W MICROSCOPIC + REFLEX CULTURE - Abnormal; Notable for the following:    Leukocytes, UA SMALL (*)    All other components within normal limits  LIPASE, BLOOD  TROPONIN I  LACTIC ACID, PLASMA  CBC WITH DIFFERENTIAL  OCCULT BLOOD X 1 CARD TO LAB, STOOL  OCCULT BLOOD, POC DEVICE   Imaging Review Dg Chest 2 View  12/07/2013   CLINICAL DATA:  Epigastric pain.  EXAM: CHEST  2 VIEW  COMPARISON:  12/14/2012 chest x-ray.  11/11/2012 chest CT.  FINDINGS: Pacemaker enters from the left with the tip in the region of the right ventricle.  Elevated right hemidiaphragm.  Mild central pulmonary vascular prominence without pulmonary edema.  No segmental infiltrate or pneumothorax.  Calcified tortuous aorta.  IMPRESSION: Pacemaker enters from the left with the tip in the region of the right ventricle.  Elevated right hemidiaphragm.  Mild central pulmonary vascular prominence without pulmonary edema.  No segmental infiltrate or pneumothorax.  Calcified tortuous aorta.   Electronically Signed   By: Bridgett Larsson M.D.   On: 12/07/2013 09:56    EKG Interpretation    Date/Time:  Monday December 07 2013 08:17:10 EST Ventricular Rate:  77 PR Interval:    QRS Duration: 110 QT Interval:  388 QTC Calculation: 439 R  Axis:   -50 Text Interpretation:  Atrial fibrillation with premature ventricular or aberrantly conducted complexes Left axis deviation Incomplete right bundle branch block Abnormal ECG When compared with ECG of 10/11/2013, No significant change was found Confirmed by [facility]  MD, KATHLEEN ([id number]) on 12/07/2013 8:35:44 AM            MDM   1. Hypotension   2. Gastritis   3. Atypical chest pain    79 yo male presents with gastric burning type pain, worsening recently, accompanied by decreased po intake. EKG reveals atrial fib, similar to previous tracing. CXR reveals no pneumonia, no evidence of pulmonary edema. Initial troponin neg, no exertional symptoms, chest pain atypical in nature. Labs reveal mild acidosis with CO2=16, lactic acid normal. Noted to by hypotensive upon initial triage with SBP=82. After hydration with 1 liter of NS, patient remained orthostatic with BP dropping from 104/60 to 84/52 when standing, no change in heart rate. Hemoccult neg per rectum. Concern for persistent hypotension despite hydration. Consider peptic ulcer given the recurrence of symptoms over few months. Dr. Sharyn Lull consulted and arrangements made for admission.     Simmie Davies, NP 12/07/13 1556

## 2013-12-07 NOTE — ED Notes (Signed)
Pt returned from radiology.

## 2013-12-07 NOTE — ED Notes (Signed)
Phlebotomy at bedside.

## 2013-12-07 NOTE — ED Notes (Signed)
Called phlebotomy to draw labs, unable to get it.

## 2013-12-07 NOTE — ED Notes (Signed)
Attempted report 

## 2013-12-07 NOTE — ED Notes (Signed)
Upper epigastric pain xiphoidal pain x 4 days  Has been taking GI cocktail and it has not helped hurts worse at night no sob or n/v

## 2013-12-08 DIAGNOSIS — E43 Unspecified severe protein-calorie malnutrition: Secondary | ICD-10-CM | POA: Insufficient documentation

## 2013-12-08 MED ORDER — ENSURE COMPLETE PO LIQD
237.0000 mL | Freq: Two times a day (BID) | ORAL | Status: DC
  Administered 2013-12-08 – 2013-12-10 (×5): 237 mL via ORAL

## 2013-12-08 NOTE — Progress Notes (Signed)
Subjective:  Patient denies any further chest pain states feels weak and tired. BP remained slow  Objective:  Vital Signs in the last 24 hours: Temp:  [97.6 F (36.4 C)-98.5 F (36.9 C)] 98.5 F (36.9 C) (12/16 0445) Pulse Rate:  [58-91] 91 (12/16 0454) Resp:  [16-17] 16 (12/16 0445) BP: (85-124)/(48-72) 85/50 mmHg (12/16 1000) SpO2:  [91 %-100 %] 100 % (12/16 0445) Weight:  [65.545 kg (144 lb 8 oz)] 65.545 kg (144 lb 8 oz) (12/15 1427)  Intake/Output from previous day: 12/15 0701 - 12/16 0700 In: 270 [P.O.:270] Out: 350 [Urine:350] Intake/Output from this shift:    Physical Exam: Neck: no adenopathy, no carotid bruit, no JVD and supple, symmetrical, trachea midline Lungs: clear to auscultation bilaterally Heart: irregularly irregular rhythm, S1, S2 normal and Soft systolic murmur noted Abdomen: soft, non-tender; bowel sounds normal; no masses,  no organomegaly Extremities: extremities normal, atraumatic, no cyanosis or edema  Lab Results:  Recent Labs  12/07/13 1702  WBC 2.7*  HGB 11.1*  PLT 178    Recent Labs  12/07/13 0900 12/07/13 1702  NA 137 138  K 4.7 4.9  CL 109 111  CO2 16* 15*  GLUCOSE 81 76  BUN 21 19  CREATININE 1.25 1.19    Recent Labs  12/07/13 2110 12/08/13 0250  TROPONINI <0.30 <0.30   Hepatic Function Panel  Recent Labs  12/07/13 1702  PROT 6.2  ALBUMIN 3.2*  AST 20  ALT 11  ALKPHOS 43  BILITOT 0.5   No results found for this basename: CHOL,  in the last 72 hours No results found for this basename: PROTIME,  in the last 72 hours  Imaging: Imaging results have been reviewed and Dg Chest 2 View  12/07/2013   CLINICAL DATA:  Epigastric pain.  EXAM: CHEST  2 VIEW  COMPARISON:  12/14/2012 chest x-ray.  11/11/2012 chest CT.  FINDINGS: Pacemaker enters from the left with the tip in the region of the right ventricle.  Elevated right hemidiaphragm.  Mild central pulmonary vascular prominence without pulmonary edema.  No segmental  infiltrate or pneumothorax.  Calcified tortuous aorta.  IMPRESSION: Pacemaker enters from the left with the tip in the region of the right ventricle.  Elevated right hemidiaphragm.  Mild central pulmonary vascular prominence without pulmonary edema.  No segmental infiltrate or pneumothorax.  Calcified tortuous aorta.   Electronically Signed   By: Bridgett Larsson M.D.   On: 12/07/2013 09:56    Cardiac Studies:  Assessment/Plan:  Status post Atypical chest pain/epigastric pain rule out gastritis peptic ulcer disease rule out GERD  Nonischemic cardiomyopathy  Status post orthostatic hypotension probably secondary to meds  Compensated systolic heart failure  History of symptomatic bradycardia status post permanent pacemaker  GERD  History of subdural hematoma  Degenerative joint disease  AA aneurysm Plan DC Aldactone OT PT consult Social service for discharge planning  LOS: 1 day    [name] [name] N 12/08/2013, 10:43 AM

## 2013-12-08 NOTE — Progress Notes (Signed)
UR completed 

## 2013-12-08 NOTE — Progress Notes (Signed)
INITIAL NUTRITION ASSESSMENT  DOCUMENTATION CODES Per approved criteria  -Severe malnutrition in the context of chronic illness   INTERVENTION:  Ensure Complete PO BID, each supplement provides 350 kcal and 13 grams of protein  Consider Megace for appetite stimulation.  NUTRITION DIAGNOSIS: Malnutrition related to inadequate oral intake as evidenced by severe depletion of subcutaneous fat and muscle mass.   Goal: Intake to meet >90% of estimated nutrition needs.  Monitor:  PO intake, labs, weight trend.  Reason for Assessment: MST=2  79 y.o. male  Admitting Dx: Chest burning/epigastric pain  ASSESSMENT: Patient came to the ER on 12/15 complaining of retrosternal chest burning associated with epigastric pain off and on for last few weeks associated with poor appetite.  Spoke with patient and his caregiver, who report that patient eats fairly well at home, drinks Ensure supplements once daily at home. Patient c/o poor appetite; requests something to help improve his appetite. Patient has progressively lost weight over the past 2 years.  Nutrition Focused Physical Exam:  Subcutaneous Fat:  Orbital Region: mild-moderate depletion Upper Arm Region: severe depletion Thoracic and Lumbar Region: mild-moderate depletion  Muscle:  Temple Region: mild-moderate depletion Clavicle Bone Region: severe depletion Clavicle and Acromion Bone Region: severe depletion Scapular Bone Region: mild-moderate depletion Dorsal Hand: mild-moderate depeletion Patellar Region: severe depletion Anterior Thigh Region: mild-moderate depletion Posterior Calf Region: severe depletion  Edema: none  Pt meets criteria for severe MALNUTRITION in the context of chronic illness as evidenced by severe depletion of subcutaneous fat and muscle mass.   Height: Ht Readings from Last 1 Encounters:  12/07/13 6\' 1"  (1.854 m)    Weight: Wt Readings from Last 1 Encounters:  12/07/13 144 lb 8 oz (65.545 kg)     Ideal Body Weight: 83.6 kg  % Ideal Body Weight: 78%  Wt Readings from Last 10 Encounters:  12/07/13 144 lb 8 oz (65.545 kg)  09/09/13 146 lb (66.225 kg)  12/11/12 159 lb (72.122 kg)  08/24/12 162 lb 6.4 oz (73.664 kg)  08/24/12 162 lb 6.4 oz (73.664 kg)  07/29/12 168 lb (76.204 kg)  01/28/12 170 lb 13.7 oz (77.5 kg)  11/19/11 172 lb (78.019 kg)    Usual Body Weight: 159 lb one year ago  % Usual Body Weight: 91%  BMI:  Body mass index is 19.07 kg/(m^2).  Estimated Nutritional Needs: Kcal: 1950-2050 Protein: 90-100 gm Fluid: 1.9-2 L  Skin: no wounds  Diet Order: Cardiac  EDUCATION NEEDS: -Education needs addressed--provided "Heart Failure Nutrition Therapy for the Undernourished" handout from the Academy of [employer] and Dietetics.   Intake/Output Summary (Last 24 hours) at 12/08/13 1519 Last data filed at 12/08/13 1328  Gross per 24 hour  Intake    430 ml  Output    700 ml  Net   -270 ml    Last BM: 12/15   Labs:   Recent Labs Lab 12/07/13 0900 12/07/13 1702  NA 137 138  K 4.7 4.9  CL 109 111  CO2 16* 15*  BUN 21 19  CREATININE 1.25 1.19  CALCIUM 8.7 8.4  MG  --  2.3  GLUCOSE 81 76    CBG (last 3)  No results found for this basename: GLUCAP,  in the last 72 hours  Scheduled Meds: . aspirin EC  81 mg Oral Daily  . carvedilol  3.125 mg Oral BID WC  . docusate sodium  100 mg Oral QHS  . famotidine  20 mg Oral BID  . heparin  5,000 Units  Subcutaneous Q8H  . pantoprazole  40 mg Oral Daily  . ramipril  2.5 mg Oral Daily    Continuous Infusions:   Past Medical History  Diagnosis Date  . Hypertension   . CHF (congestive heart failure)   . GERD (gastroesophageal reflux disease)   . Recurrent upper respiratory infection (URI)     recent cold- treating /w OTC  . Arthritis     "legs are stiff" , had spinal injections for pain relieve 3-4 yrs. ago  . Atrial fibrillation-permanent   . Constipation   . Depression   . Nonischemic/  ischemic cardiomyopathy     Single-vessel coronary disease catheterization 2004-Myoview showing inferior wall perfusion defect Myoview 2006  . Subdural hematoma     Prior evacuation  . Cataract   . Bradycardia   . Diverticulosis   . AAA (abdominal aortic aneurysm) without rupture     10/2012 CT scan: 4.3x4.6cm    Past Surgical History  Procedure Laterality Date  . Evacuation of subdural hematoma    . Eye surgery      cataract extracted in L eye  . Hernia repair      R side inguinal repair   . Brain surgery      2002  . Tonsillectomy      1957  . Cataract extraction w/phaco  01/30/2012    Procedure: CATARACT EXTRACTION PHACO AND INTRAOCULAR LENS PLACEMENT (IOC);  Surgeon: Shade Flood, MD;  Location: [facility] OR;  Service: Ophthalmology;  Laterality: Right;  . Pars plana vitrectomy  07/30/2012    Procedure: PARS PLANA VITRECTOMY WITH 23 GAUGE;  Surgeon: Shade Flood, MD;  Location: [facility] OR;  Service: Ophthalmology;  Laterality: Right;  Insertion of Silicone Oil  . Gas/fluid exchange  07/30/2012    Procedure: GAS/FLUID EXCHANGE;  Surgeon: Shade Flood, MD;  Location: [facility] OR;  Service: Ophthalmology;  Laterality: Right;  . Pacemaker insertion  08/26/12    SJM Acc[employer] SR RF pacemaker    Joaquin Courts, RD, LDN, CNSC Pager [phone] After Hours Pager [phone]

## 2013-12-08 NOTE — Progress Notes (Signed)
Patient's BP 85/50 lying after am coreg.  Altace and aldactone held.  Dr. Sharyn Lull paged and notified.  Will continue to monitor.  [name] [name]

## 2013-12-09 LAB — GLUCOSE, CAPILLARY

## 2013-12-09 NOTE — Evaluation (Signed)
Physical Therapy Evaluation Patient Details Name: [name] [name] MRN: [id number] DOB: 12/18/1923 Today's Date: 12/09/2013 Time: 4098-1191 PT Time Calculation (min): 21 min  PT Assessment / Plan / Recommendation History of Present Illness  Patient is 79 year old male with past medical history significant for multiple medical problems i.e. nonischemic cardiomyopathy, history of recurrent congestive heart failure secondary to systolic dysfunction, hypertension, chronic atrial fibrillation, history of symptomatic bradycardia status post permanent pacemaker, GERD, history of subdural hematoma, history of abdominal aortic aneurysm, degenerative joint disease, came to the ER complaining of retrosternal chest burning associated with epigastric pain off and on for last few weeks associated with poor appetite. Pt dx w/ chest pain, orthostatic hypotension & nonischemia cardiomyopathy.  Clinical Impression  Despite very low standing BP (70/40) pt was asymptomatic with gait.  He is generally unsteady on his feet and would likely need less assist if he could use a RW full time, but it will not fit in all of the doorways in his home.  He is refusing SNF for rehab and is agreeable to HHPT services at discharge.  I would max out his HH services to help increase his safety at home as he does not have 24/7 assist.   PT to follow acutely for deficits listed below.       PT Assessment  Patient needs continued PT services    Follow Up Recommendations  Home health PT (pt did not want to go to SNF for rehab.  )    Does the patient have the potential to tolerate intense rehabilitation     NA  Barriers to Discharge  does not have 24/7 assist at home.  Aid 2.5 hours per day, 7 days per week.        Equipment Recommendations  None recommended by PT    Recommendations for Other Services   None  Frequency Min 3X/week    Precautions / Restrictions Precautions Precautions: Other (comment) (low BPs) Precaution  Comments: Check orthostatic vitals Restrictions Other Position/Activity Restrictions: Up w/ assist only per orders in chart, check/monitor orthostatic vitals.   Pertinent Vitals/Pain Standing BP 70/40.  Pt asymptomatic throughout session.        Mobility  Bed Mobility Bed Mobility: Supine to Sit;Sitting - Scoot to Edge of Bed;Sit to Supine Rolling Right: 4: Min assist;With rail Right Sidelying to Sit: 4: Min assist;HOB elevated;With rails Supine to Sit: 5: Supervision;With rails;HOB elevated Sitting - Scoot to Edge of Bed: 5: Supervision;With rail Sit to Supine: 5: Supervision;With rail;HOB flat Details for Bed Mobility Assistance: supervision for safety and cues.  PT relying on railing for support.   Transfers Transfers: Sit to Stand;Stand to Sit Sit to Stand: 4: Min guard;With upper extremity assist;With armrests;From bed Stand to Sit: 5: Supervision;With upper extremity assist;With armrests;To bed Details for Transfer Assistance: min guard to steady pt for balance to get to standing.   Ambulation/Gait Ambulation/Gait Assistance: 4: Min guard Ambulation Distance (Feet): 75 Feet Assistive device: 1 person hand held assist;Straight cane Ambulation/Gait Assistance Details: min guard assist to steady pt for balance while using the cane.  Pt has not been out of bed since monday (today is Wed) and he feels a bit unsteady.  He has no reports of lightheadedness despite his very low BP in standing 70/40.   Gait Pattern: Step-through pattern (mildly staggering. ) Gait velocity: decreased        PT Diagnosis: Difficulty walking;Generalized weakness;Abnormality of gait  PT Problem List: Decreased strength;Decreased activity tolerance;Decreased balance;Decreased mobility;Cardiopulmonary status limiting activity  PT Treatment Interventions: DME instruction;Gait training;Stair training;Functional mobility training;Therapeutic activities;Therapeutic exercise;Balance training;Neuromuscular  re-education;Patient/family education     PT Goals(Current goals can be found in the care plan section) Acute Rehab PT Goals Patient Stated Goal: to go home  PT Goal Formulation: With patient Time For Goal Achievement: 12/23/13 Potential to Achieve Goals: Good  Visit Information  Last PT Received On: 12/09/13 Assistance Needed: +1 History of Present Illness: Patient is 79 year old male with past medical history significant for multiple medical problems i.e. nonischemic cardiomyopathy, history of recurrent congestive heart failure secondary to systolic dysfunction, hypertension, chronic atrial fibrillation, history of symptomatic bradycardia status post permanent pacemaker, GERD, history of subdural hematoma, history of abdominal aortic aneurysm, degenerative joint disease, came to the ER complaining of retrosternal chest burning associated with epigastric pain off and on for last few weeks associated with poor appetite. Pt dx w/ chest pain, orthostatic hypotension & nonischemia cardiomyopathy.       Prior Functioning  Home Living Family/patient expects to be discharged to:: Private residence Living Arrangements: Alone Available Help at Discharge: Other (Comment) (has aid 7 days per week for 2.5 hours per day) Type of Home: House Home Access: Stairs to enter [employer] of Steps: 3 STE Entrance Stairs-Rails: Left Home Layout: One level Home Equipment: Walker - 2 wheels;Cane - single point;Tub bench;Hand held shower head;Bedside commode Prior Function Level of Independence: Needs assistance Gait / Transfers Assistance Needed: Ambulates w/ SPC most of the time, occasionally uses RW but "it can't go everywhere in the house, it doesn't fit" ADL's / Homemaking Assistance Needed: Personal care attendent 7 days per week for 2 1/2-3 hours a day, assists w/ housekeeping, meal prep and ADL/bathing. Communication / Swallowing Assistance Needed: none Comments: pt report he no longer  drives due to cataracts.   Communication Communication: No difficulties Dominant Hand: Right    Cognition  Cognition Arousal/Alertness: Awake/alert Behavior During Therapy: WFL for tasks assessed/performed Overall Cognitive Status: Within Functional Limits for tasks assessed    Extremity/Trunk Assessment Upper Extremity Assessment Upper Extremity Assessment: Defer to OT evaluation Lower Extremity Assessment Lower Extremity Assessment: Generalized weakness (4/5 throughout per MMT EOB) Cervical / Trunk Assessment Cervical / Trunk Assessment: Normal   Balance Balance Balance Assessed: Yes Static Sitting Balance Static Sitting - Balance Support: No upper extremity supported;Feet supported Static Sitting - Level of Assistance: 7: Independent Static Standing Balance Static Standing - Balance Support: Bilateral upper extremity supported Static Standing - Level of Assistance: 4: Min assist Dynamic Standing Balance Dynamic Standing - Balance Support: Bilateral upper extremity supported Dynamic Standing - Level of Assistance: 4: Min assist  End of Session PT - End of Session Equipment Utilized During Treatment: Gait belt Activity Tolerance: Patient limited by fatigue Patient left: in bed;with call bell/phone within reach  GP Functional Assessment Tool Used: assist level Functional Limitation: Mobility: Walking and moving around Mobility: Walking and Moving Around Current Status [phone]): At least 1 percent but less than 20 percent impaired, limited or restricted Mobility: Walking and Moving Around Goal Status [phone]): 0 percent impaired, limited or restricted   Favio Moder B. Michele Judy, PT, DPT [phone]   12/09/2013, 11:57 AM

## 2013-12-09 NOTE — Progress Notes (Signed)
Subjective:  Patient denies any chest pain or shortness of breath. Still feels weak. BP remains on lower side but improved  Objective:  Vital Signs in the last 24 hours: Temp:  [97.9 F (36.6 C)-98.9 F (37.2 C)] 98.9 F (37.2 C) (12/17 0615) Pulse Rate:  [56-88] 59 (12/17 1647) BP: (70-126)/(40-79) 96/63 mmHg (12/17 1647) SpO2:  [92 %-100 %] 97 % (12/17 1444) Weight:  [64.456 kg (142 lb 1.6 oz)] 64.456 kg (142 lb 1.6 oz) (12/17 0615)  Intake/Output from previous day: 12/16 0701 - 12/17 0700 In: 515 [P.O.:515] Out: 350 [Urine:350] Intake/Output from this shift: Total I/O In: 480 [P.O.:480] Out: 625 [Urine:625]  Physical Exam: Neck: no adenopathy, no carotid bruit, no JVD and supple, symmetrical, trachea midline Lungs: clear to auscultation bilaterally Heart: irregularly irregular rhythm, S1, S2 normal and Soft systolic murmur noted Abdomen: soft, non-tender; bowel sounds normal; no masses,  no organomegaly Extremities: extremities normal, atraumatic, no cyanosis or edema  Lab Results:  Recent Labs  12/07/13 1702  WBC 2.7*  HGB 11.1*  PLT 178    Recent Labs  12/07/13 0900 12/07/13 1702  NA 137 138  K 4.7 4.9  CL 109 111  CO2 16* 15*  GLUCOSE 81 76  BUN 21 19  CREATININE 1.25 1.19    Recent Labs  12/07/13 2110 12/08/13 0250  TROPONINI <0.30 <0.30   Hepatic Function Panel  Recent Labs  12/07/13 1702  PROT 6.2  ALBUMIN 3.2*  AST 20  ALT 11  ALKPHOS 43  BILITOT 0.5   No results found for this basename: CHOL,  in the last 72 hours No results found for this basename: PROTIME,  in the last 72 hours  Imaging: Imaging results have been reviewed  Cardiac Studies:  Assessment/Plan:  Status post Atypical chest pain/epigastric pain rule out gastritis peptic ulcer disease rule out GERD  Nonischemic cardiomyopathy  Status post orthostatic hypotension probably secondary to meds  Compensated systolic heart failure  History of symptomatic bradycardia  status post permanent pacemaker  GERD  History of subdural hematoma  Degenerative joint disease  AA aneurysm  Severe Protein calorie malnutrition Plan As per orders Increase ambulation Possible discharge tomorrow if stable Check orthostatics  LOS: 2 days    [name] [name] N 12/09/2013, 5:25 PM

## 2013-12-09 NOTE — ED Provider Notes (Signed)
Medical screening examination/treatment/procedure(s) were performed by non-physician practitioner and as supervising physician I was immediately available for consultation/collaboration.  EKG Interpretation    Date/Time:  Monday December 07 2013 08:17:10 EST Ventricular Rate:  77 PR Interval:    QRS Duration: 110 QT Interval:  388 QTC Calculation: 439 R Axis:   -50 Text Interpretation:  Atrial fibrillation with premature ventricular or aberrantly conducted complexes Left axis deviation Incomplete right bundle branch block Abnormal ECG When compared with ECG of 10/11/2013, No significant change was found Confirmed by [facility]  MD, Jaycelynn Knickerbocker [phone]) on 12/07/2013 8:35:44 AM              Laray Anger, DO 12/09/13 [phone]

## 2013-12-09 NOTE — Evaluation (Signed)
Occupational Therapy Evaluation Patient Details Name: [name] [name] MRN: [id number] DOB: [birth year] Today's Date: 12/09/2013 Time: 4098-1191 OT Time Calculation (min): 38 min  OT Assessment / Plan / Recommendation History of present illness Patient is 79 year old male with past medical history significant for multiple medical problems i.e. nonischemic cardiomyopathy, history of recurrent congestive heart failure secondary to systolic dysfunction, hypertension, chronic atrial fibrillation, history of symptomatic bradycardia status post permanent pacemaker, GERD, history of subdural hematoma, history of abdominal aortic aneurysm, degenerative joint disease, came to the ER complaining of retrosternal chest burning associated with epigastric pain off and on for last few weeks associated with poor appetite. Pt dx w/ chest pain, orthostatic hypotension & nonischemia cardiomyopathy.   Clinical Impression   Pt w/ current limitations in his ability to perform ADL's and self care tasks secondary to dx above. Pt lives alone w/ 2 1/2 - 3 hrs per day care attendant assistance for homemaking, ADL's, meal prep. Pt should benefit from acute OT to assist in maximizing independence prior to d/c to next venue. Monitor orthostatic BP.    OT Assessment  Patient needs continued OT Services    Follow Up Recommendations  SNF;Supervision/Assistance - 24 hour    Barriers to Discharge      Equipment Recommendations  Other (comment) (Defer to next venue)    Recommendations for Other Services    Frequency  Min 2X/week    Precautions / Restrictions Precautions Precautions: Other (comment) (Orthostatic hypotension) Precaution Comments: Check orthostatic vitals Restrictions Other Position/Activity Restrictions: Up w/ assist only per orders in chart, check/monitor orthostatic vitals.   Pertinent Vitals/Pain BP supine 98/61 HR 60; BP in sitting EOB 90/54 HR 60, RN staff notified. No OOB activity performed. Pt supine  in bed w/ call bell and bed alarm on.    ADL  Grooming: Performed;Wash/dry hands;Wash/dry face;Set up;Supervision/safety Where Assessed - Grooming: Unsupported sitting Upper Body Bathing: Performed;Chest;Right arm;Left arm;Abdomen;Min guard;Supervision/safety Where Assessed - Upper Body Bathing: Unsupported sitting Lower Body Bathing: Performed;Minimal assistance Where Assessed - Lower Body Bathing: Supported sitting (OOB activity deferred 2* low BP, pt asked to bathe EOB & not supine HOB elevated.) Upper Body Dressing: Performed;Minimal assistance Where Assessed - Upper Body Dressing: Unsupported sitting Lower Body Dressing: Performed;Minimal assistance Where Assessed - Lower Body Dressing: Unsupported sitting Toilet Transfer:  (Pt currently using urinal only 2* low BP) Tub/Shower Transfer Method: Not assessed Transfers/Ambulation Related to ADLs: OOB activity deferred secondary to orthostatic hypotension.  ADL Comments: Pt seen for bed level and sitting EOB level OT assessment and ADL retraining session today. No OOB activit was performed secondary to BP in supine 98/61 HR 60. Pt declined supine ADL's w/ HOB elevated & asked if he could sit EOB to bathe. BP sitting EOB 90/54 HR 60, RN made aware. Pt currently requiring increased assistance for ADL's and selfcare tasks and may benefit from short term Rehab at SNF 2* lives alone w/ personal care attendent only 2.5-3 hrs per day.     OT Diagnosis: Generalized weakness;Other (comment) (Orthostatic hypotension)  OT Problem List: Decreased strength;Decreased activity tolerance;Decreased knowledge of use of DME or AE;Cardiopulmonary status limiting activity OT Treatment Interventions: Self-care/ADL training;DME and/or AE instruction;Patient/family education;Therapeutic activities   OT Goals(Current goals can be found in the care plan section) Acute Rehab OT Goals Patient Stated Goal: "Lord willing, if I get well and live, I'll take me a walk  outside" Time For Goal Achievement: 12/23/13 Potential to Achieve Goals: Good  Visit Information  Last OT Received On: 12/09/13 Assistance  Needed: +1 History of Present Illness: Patient is 79 year old male with past medical history significant for multiple medical problems i.e. nonischemic cardiomyopathy, history of recurrent congestive heart failure secondary to systolic dysfunction, hypertension, chronic atrial fibrillation, history of symptomatic bradycardia status post permanent pacemaker, GERD, history of subdural hematoma, history of abdominal aortic aneurysm, degenerative joint disease, came to the ER complaining of retrosternal chest burning associated with epigastric pain off and on for last few weeks associated with poor appetite. Pt dx w/ chest pain, orthostatic hypotension & nonischemia cardiomyopathy.       Prior Functioning     Home Living Family/patient expects to be discharged to:: Private residence Living Arrangements: Alone Available Help at Discharge: Other (Comment) (Personal care attendent 7 days per week 2 1.2-3 hours per day) Type of Home: House Home Access: Stairs to enter [employer] of Steps: 3 STE Entrance Stairs-Rails: Left Home Layout: One level Home Equipment: Walker - 2 wheels;Cane - single point;Tub bench;Hand held shower head;Bedside commode Prior Function Level of Independence: Needs assistance Gait / Transfers Assistance Needed: Ambulates w/ SPC most of the time, occasionally uses RW but "it can't go everywhere in the house, it doesn't fit" ADL's / Homemaking Assistance Needed: Personal care attendent 7 days per week for 2 1/2-3 hours a day, assists w/ housekeeping, meal prep and ADL/bathing. Communication Communication: No difficulties Dominant Hand: Right    Vision/Perception Vision - History Baseline Vision: Wears glasses only for reading Vision - Assessment Vision Assessment: Vision not tested   Cognition   Cognition Arousal/Alertness: Awake/alert Behavior During Therapy: WFL for tasks assessed/performed Overall Cognitive Status: Within Functional Limits for tasks assessed    Extremity/Trunk Assessment Upper Extremity Assessment Upper Extremity Assessment: Generalized weakness Lower Extremity Assessment Lower Extremity Assessment: Defer to PT evaluation    Mobility Bed Mobility Bed Mobility: Rolling Right;Right Sidelying to Sit Rolling Right: 4: Min assist;With rail Right Sidelying to Sit: 4: Min assist;HOB elevated;With rails Details for Bed Mobility Assistance: Increased time for tasks secondary to deconditioning. BP assessed in sitting EOB 90/54 HR 60, RN staff made aware and no OOB activity was performed. Transfers Transfers: Not assessed Details for Transfer Assistance: See above, pt orthostatic.        Balance Balance Balance Assessed: Yes Static Sitting Balance Static Sitting - Balance Support: No upper extremity supported;Feet supported   End of Session OT - End of Session Activity Tolerance: Treatment limited secondary to medical complications (Comment) Patient left: in bed;with call bell/phone within reach;with bed alarm set Nurse Communication: Mobility status;Other (comment) (BP)  GO Functional Assessment Tool Used: Clinical Judgement Functional Limitation: Self care Self Care Current Status (E4540): At least 40 percent but less than 60 percent impaired, limited or restricted Self Care Goal Status (J8119): At least 1 percent but less than 20 percent impaired, limited or restricted   Alm Bustard 12/09/2013, 11:12 AM

## 2013-12-09 NOTE — Care Management Note (Addendum)
    Page 1 of 2   12/09/2013     3:04:09 PM   CARE MANAGEMENT NOTE 12/09/2013  Patient:  [name] [name]   Account Number:  [id number]  Date Initiated:  12/09/2013  Documentation initiated by:  GRAVES-BIGELOW,Floy Riegler  Subjective/Objective Assessment:   Pt admitted for cp and hypotension. CM did speak to daughter. Pt and daughter agreeable to [facility] services via [facility].     Action/Plan:   CM did make referral to [facility] for services and SOC to begin within 24-48 hours post d/c. MD to place orders.   Anticipated DC Date:  12/09/2013   Anticipated DC Plan:  HOME W HOME HEALTH SERVICES      DC Planning Services  CM consult      [facility] Choice  HOME HEALTH   Choice offered to / List presented to:  C-4 Adult Children        HH arranged  HH-1 RN  HH-10 DISEASE MANAGEMENT  HH-2 PT  HH-4 NURSE'S AIDE  HH-6 SOCIAL WORKER      HH agency  [facility]   Status of service:  Completed, signed off Medicare Important Message given?   (If response is "NO", the following Medicare IM given date fields will be blank) Date Medicare IM given:   Date Additional Medicare IM given:    Discharge Disposition:  HOME W HOME HEALTH SERVICES  Per UR Regulation:  Reviewed for med. necessity/level of care/duration of stay  If discussed at Long Length of Stay Meetings, dates discussed:    Comments:

## 2013-12-10 MED ORDER — ENSURE COMPLETE PO LIQD: 237.0000 mL | Freq: Two times a day (BID) | ORAL | Status: DC

## 2013-12-10 MED ORDER — RAMIPRIL 1.25 MG PO CAPS: 1.2500 mg | ORAL_CAPSULE | Freq: Every day | ORAL | Status: DC

## 2013-12-10 MED ORDER — CARVEDILOL 3.125 MG PO TABS: 3.1250 mg | ORAL_TABLET | Freq: Two times a day (BID) | ORAL | Status: DC

## 2013-12-10 NOTE — Progress Notes (Signed)
Pt discharged home, all lines removed including IV and telemetry. Pt discharged home mediation (coreg) order clarified with Dr. Sharyn Lull prior to pt education and MD [facility] with no changes pt order. Pt discharge education and instruction completed with pt and his home aide; both denies any questions. Aide educated to check pt BP prior to administration of BP medication d/t pt ohostatic hypotension. Pt transported off unit via wheelchair with belongings and home aide at side.

## 2013-12-10 NOTE — Discharge Summary (Signed)
  Discharge summary dictated on 12/10/2013 dictation number is [id number]

## 2013-12-11 NOTE — Discharge Summary (Signed)
[name], PRIMMER NO.:  [id number]  MEDICAL RECORD NO.:  [id number]  LOCATION:  3W34C                        FACILITY:  [facility]  PHYSICIAN:  Alegra Rost N. Sharyn Lull, M.D. DATE OF BIRTH:  01-09-1923  DATE OF ADMISSION:  12/07/2013 DATE OF DISCHARGE:  12/10/2013                              DISCHARGE SUMMARY   ADMITTING DIAGNOSES: 1. Atypical chest pain/epigastric pain rule out gastritis, rule out     peptic ulcer disease, rule out gastroesophageal reflux disease. 2. Nonischemic cardiomyopathy. 3. Status post orthostatic hypotension secondary to medications. 4. Compensated systolic heart failure. 5. History of symptomatic bradycardia status post permanent pacemaker     in the past. 6. Gastroesophageal reflux disease. 7. History of subdural hematoma in the past. 8. Degenerative joint disease. 9. Abdominal aortic aneurysm.  FINAL DIAGNOSES: 1. Status post atypical chest pain myocardial infarction ruled out. 2. Status post orthostatic hypotension secondary to medication. 3. Nonischemic cardiomyopathy. 4. Compensated systolic heart failure. 5. History of symptomatic bradycardia status post permanent pacemaker     in the past. 6. Gastroesophageal reflux disease. 7. History of subdural hematoma. 8. Degenerative joint disease. 9. Abdominal aortic aneurysm.  DISCHARGE HOME MEDICATIONS: 1. Ensure 1 can twice daily. 2. Carvedilol has been reduced to 3.125 mg twice daily. 3. Ramipril has been reduced to 0.125 mg daily. 4. Continue aspirin 81 mg daily. 5. Colace 100 mg at bedtime as needed. 6. Multivitamin with mineral 1 tablet daily. 7. Protonix 20 mg daily. 8. Ranitidine 150 mg by mouth daily. 9. The patient has been advised to stop hydralazine, isosorbide     dinitrate, spironolactone and Flomax.  DIET:  Low salt, low cholesterol.  ACTIVITY:  Increase activity slowly as tolerated.  FOLLOWUP:  Follow up with me in 1 week.  CONDITION AT DISCHARGE:   Stable.  BRIEF HISTORY AND HOSPITAL COURSE:  Mr. [name] [name] is an 79 year old male with past medical history significant for multiple medical problems, i.e., nonischemic cardiomyopathy, history of recurrent congestive heart failure secondary to systolic dysfunction, hypertension, chronic atrial fibrillation, history of symptomatic bradycardia status post permanent pacemaker in the past, GERD, history of subdural hematoma, history of abdominal aortic aneurysm, degenerative joint disease.  He came to the ER complaining of retrosternal chest pain, burning associated with epigastric pain off and on for few weeks associated with poor appetite. States chest burning gets bad after taking Tums.  Denies any exertional chest pain although activity is limited.  Denies any cough, fever, chills.  Denies any urinary complaints.  Denies any palpitation, lightheadedness, or syncope.  Denies history of PND, orthopnea, or leg swelling.  The patient was noted to have orthostatic hypotension in the ED, received total 1 mL of normal saline.  Despite that remains hypotensive with blood pressure in 90s to 100.  PHYSICAL EXAMINATION:  GENERAL:  He was alert, awake, oriented x3 in no acute distress. VITAL SIGNS:  Blood pressure after 1 L of normal saline was 106/61, pulse was 67.  EYES:  Conjunctivae was pink. NECK:  Supple.  No JVD. LUNGS:  Clear to auscultation without rhonchi or rales. CARDIOVASCULAR:  Irregularly irregular.  S1, S2 was soft.  There was soft systolic murmur.  No S3  gallop. ABDOMEN:  Soft.  Bowel sounds were present.  There was mild epigastric tenderness.  No guarding, no rebound. EXTREMITIES:  No clubbing, cyanosis, or edema.  LABORATORY DATA:  Sodium was 137, potassium 4.7, BUN 21, creatinine 1.25.  Three sets of troponin-I were negative.  Hemoglobin was 11.1, hematocrit 32.7, white count of 2.7.  TSH was 1.20.  Stool for occult blood was negative.  EKG showed AFib with ventricular paced  rhythm.  BRIEF HOSPITAL COURSE:  The patient was admitted to telemetry unit.  MI was ruled out by serial enzymes and EKG.  All his initially blood pressure medications were held and was started on low-dose carvedilol and ramipril which has to be held also.  OT/PT consultation was called. The patient has been ambulating with assistance.  Discussed with the patient regarding skilled nursing facility versus discharging home.  The patient is eager to go home and wanted to go home.  The patient will be discharged home on above medications and will be followed up in my office in 1 week.  Home health aid and RN has been arranged.     Eduardo Osier. Sharyn Lull, M.D.     [facility]  D:  12/10/2013  T:  12/11/2013  Job:  [id number]

## 2013-12-14 ENCOUNTER — Ambulatory Visit (INDEPENDENT_AMBULATORY_CARE_PROVIDER_SITE_OTHER): Payer: Medicare Other | Admitting: *Deleted

## 2013-12-14 DIAGNOSIS — I498 Other specified cardiac arrhythmias: Secondary | ICD-10-CM

## 2013-12-14 DIAGNOSIS — R001 Bradycardia, unspecified: Secondary | ICD-10-CM

## 2013-12-14 LAB — MDC_IDC_ENUM_SESS_TYPE_INCLINIC
Battery Voltage: 2.98 V
Implantable Pulse Generator Model: 1210
Lead Channel Pacing Threshold Amplitude: 0.75 V
Lead Channel Pacing Threshold Pulse Width: 0.4 ms
Lead Channel Pacing Threshold Pulse Width: 0.4 ms
Lead Channel Sensing Intrinsic Amplitude: 12 mV

## 2013-12-14 NOTE — Progress Notes (Signed)
Pacemaker check in clinic. Normal device function. Threshold, sensing, and impedance consistent with previous measurements. Device programmed to maximize longevity. Permanent AF + ASA (not a coumadin candidate). No high ventricular rates noted. Device programmed at appropriate safety margins. Histogram distribution appropriate for patient activity level. Device programmed to optimize intrinsic conduction. Estimated longevity 10-10-5 years. Patient enrolled in remote follow-up. Plan to follow up remotely on 03-17-2014 and with JA in September 2015. Patient education completed.

## 2013-12-15 ENCOUNTER — Telehealth: Payer: Self-pay | Admitting: Internal Medicine

## 2013-12-15 NOTE — Telephone Encounter (Signed)
New message    Pt does not have a remote box to transmit his pacemaker.  His home aide discovered this recently.

## 2013-12-15 NOTE — Telephone Encounter (Signed)
New transmitter ordered, patient aware.

## 2014-01-19 ENCOUNTER — Encounter: Payer: Self-pay | Admitting: Internal Medicine

## 2014-03-17 ENCOUNTER — Ambulatory Visit (INDEPENDENT_AMBULATORY_CARE_PROVIDER_SITE_OTHER): Payer: Medicare Other | Admitting: *Deleted

## 2014-03-17 DIAGNOSIS — I4891 Unspecified atrial fibrillation: Secondary | ICD-10-CM

## 2014-03-17 DIAGNOSIS — I428 Other cardiomyopathies: Secondary | ICD-10-CM

## 2014-03-17 DIAGNOSIS — R001 Bradycardia, unspecified: Secondary | ICD-10-CM

## 2014-03-17 DIAGNOSIS — I498 Other specified cardiac arrhythmias: Secondary | ICD-10-CM

## 2014-03-19 ENCOUNTER — Encounter: Payer: Self-pay | Admitting: Family Medicine

## 2014-03-19 ENCOUNTER — Ambulatory Visit (INDEPENDENT_AMBULATORY_CARE_PROVIDER_SITE_OTHER): Payer: Medicare Other | Admitting: Family Medicine

## 2014-03-19 VITALS — BP 120/71 | HR 56 | Temp 97.9°F | Ht 73.0 in | Wt 150.0 lb

## 2014-03-19 DIAGNOSIS — K219 Gastro-esophageal reflux disease without esophagitis: Secondary | ICD-10-CM

## 2014-03-19 DIAGNOSIS — N400 Enlarged prostate without lower urinary tract symptoms: Secondary | ICD-10-CM

## 2014-03-19 DIAGNOSIS — M545 Low back pain, unspecified: Secondary | ICD-10-CM

## 2014-03-19 DIAGNOSIS — M549 Dorsalgia, unspecified: Secondary | ICD-10-CM

## 2014-03-19 MED ORDER — PANTOPRAZOLE SODIUM 20 MG PO TBEC: 20.0000 mg | DELAYED_RELEASE_TABLET | Freq: Every day | ORAL | Status: DC

## 2014-03-19 NOTE — Patient Instructions (Signed)
Nice to meet you. Please go get the x-ray to look at your lower back. I will touch base with Dr Terrence Dupont to see what his thoughts are on starting a medication for your prostate. I will see you back in a month.

## 2014-03-21 DIAGNOSIS — K219 Gastro-esophageal reflux disease without esophagitis: Secondary | ICD-10-CM | POA: Insufficient documentation

## 2014-03-21 DIAGNOSIS — N400 Enlarged prostate without lower urinary tract symptoms: Secondary | ICD-10-CM | POA: Insufficient documentation

## 2014-03-21 DIAGNOSIS — M549 Dorsalgia, unspecified: Secondary | ICD-10-CM | POA: Insufficient documentation

## 2014-03-21 NOTE — Progress Notes (Signed)
Patient ID: [name] [name], male   DOB: December 21, 1923, 80 y.o.   MRN: [id number]  [name], MD Phone: [phone]  [name] [name] is a 80 y.o. male who presents today as a new patient for back pain and "trouble holding water".  Back pain: is in his low back. States he can't hardly straighten up in the am as he is stiff, though once he gets moving he is able to move more easily. It is a dull pain. He denies numbness or tingling. No saddle anesthesia. He does report trouble with incontinence in the morning, though does not have this at other times of the day. He notes constipation. The pain does not radiate. No fevers. No history of cancer. Note patient has a history of this back pain and has received epidural injection previously with benefit.  "Trouble holding water": notes this occurs in the morning. He will wake up and feel as though he needs to go right away or he will urinate on himself. This is not associated with dysuria or frequency. He notes getting up to urinate 2-3x/night. No blood in his urine.  SHx: Patient is a nonsmoker. He exercises by walking  PMH: GERD, irregular heart beat  FH: none reported   Patient Information Form: Screening and ROS  Do you feel safe in relationships? yes PHQ-2:negative  Review of Symptoms  General:  Negative for nexplained weight loss, fever Skin: Negative for new or changing mole, sore that won't heal HEENT: Negative for trouble hearing, trouble seeing, ringing in ears, mouth sores, hoarseness, change in voice, dysphagia. CV:  Negative for chest pain, dyspnea, edema, palpitations Resp: Negative for cough, dyspnea, hemoptysis GI: Negative for nausea, vomiting, diarrhea, constipation, abdominal pain, melena, hematochezia. GU: Negative for dysuria, incontinence, urinary hesitance, hematuria, vaginal or penile discharge, polyuria, sexual difficulty, lumps in testicle or breasts MSK: Positive for muscle cramps or aches, joint pain or  swelling Neuro: Negative for headaches, weakness, numbness, dizziness, passing out/fainting Psych: Negative for depression, anxiety, memory problems     Physical Exam Filed Vitals:   03/19/14 0932  BP: 120/71  Pulse: 56  Temp: 97.9 F (36.6 C)    Physical Examination: General appearance - alert, well appearing, and in no distress Eyes - pupils equal and reactive, extraocular eye movements intact Mouth - mucous membranes moist, pharynx normal without lesions Neck - supple, no significant adenopathy Chest - clear to auscultation, no wheezes, rales or rhonchi, symmetric air entry Heart - normal rate, regular rhythm, normal S1, S2, no murmurs, rubs, clicks or gallops Abdomen - soft, nontender, nondistended, no masses or organomegaly Rectal - good rectal tone and senstation PROSTATE EXAM: smooth and symmetric without nodules or tenderness, enlarged  Back exam - no apparent swelling or erythema, no tenderness to palpation, good ROM Neurological - CN 2-12 intact, 5/5 strength in bilateral deltoids, biceps, triceps, grip, hip flexors, quads, hamstrings, plantar and dorsiflexion, sensation to light touch intact throughout, reflexes absent in bilateral achilles and patella Extremities - no pedal edema noted Skin - normal coloration and turgor, no rashes, no suspicious skin lesions noted   Assessment/Plan: Please see individual problem list.

## 2014-03-21 NOTE — Assessment & Plan Note (Addendum)
Prostate enlarged on exam and patient with incontinence in am possibly relating to the enlargement. Discussed possibly starting patient on flomax. Given low doses of coreg and ramipril and the need for these with CHF, I discussed the starting of flomax with Dr Valentina Lucks who stated that this would likely not affect his blood pressure much. Will discuss this with the patient and advise that the patient should hold this if he becomes dizzy.

## 2014-03-21 NOTE — Assessment & Plan Note (Signed)
Patient without active back pain at this time. Given enlarged prostate consideration was given to prostate cancer with mets as a cause of his back pain. Discussed option of obtaining a PSA to evaluate for this or getting a lumbar XR to look for lesions. Patient opted for XR. PSA likely of little utility as it would not change management in this 80 yo with life expectancy less than 10 years. Lumbar spine film ordered. Will continue to monitor this.

## 2014-03-24 ENCOUNTER — Ambulatory Visit (HOSPITAL_COMMUNITY)
Admission: RE | Admit: 2014-03-24 | Discharge: 2014-03-24 | Disposition: A | Discharge status: Home / Self Care | Payer: Medicare Other | Source: Ambulatory Visit | Attending: Family Medicine | Admitting: Family Medicine

## 2014-03-24 DIAGNOSIS — I714 Abdominal aortic aneurysm, without rupture, unspecified: Secondary | ICD-10-CM | POA: Insufficient documentation

## 2014-03-24 DIAGNOSIS — M545 Low back pain, unspecified: Secondary | ICD-10-CM | POA: Insufficient documentation

## 2014-03-25 ENCOUNTER — Telehealth: Payer: Self-pay | Admitting: Family Medicine

## 2014-03-25 DIAGNOSIS — I714 Abdominal aortic aneurysm, without rupture, unspecified: Secondary | ICD-10-CM

## 2014-03-25 MED ORDER — TAMSULOSIN HCL 0.4 MG PO CAPS: 0.4000 mg | ORAL_CAPSULE | Freq: Every day | ORAL | Status: DC

## 2014-03-25 NOTE — Telephone Encounter (Signed)
Spoke with MD, non urgent referral.  Will schedule next week. [name] [name], [name] [name]

## 2014-03-25 NOTE — Telephone Encounter (Signed)
Called to speak with patient regarding lumbar spine imaging results. Advised that his boney spine appeared to have no acute abnormalities, though they noted a AAA, that was previously seen on CTA of the abdomen in 2013. Discussed further monitoring of this issue and the patient elected to proceed with imaging to evaluate the size of this lesion at this time as he felt that he would proceed with a surgical procedure if needed. Patient also elected to start flomax for his BPH and I advised him to be weary of light headedness or dizziness with this. I discussed addition of this medication with Dr Valentina Lucks and he advised that it should have a small if any effect on blood pressure and to monitor for light headedness. I will plan on seeing the patient back with his regularly scheduled appointment later this month.

## 2014-03-30 LAB — MDC_IDC_ENUM_SESS_TYPE_REMOTE
Brady Statistic RV Percent Paced: 87 %
Date Time Interrogation Session: 20150325143318
Implantable Pulse Generator Model: 1210
Lead Channel Impedance Value: 560 Ohm
Lead Channel Pacing Threshold Amplitude: 0.75 V
Lead Channel Pacing Threshold Pulse Width: 0.4 ms
Lead Channel Sensing Intrinsic Amplitude: 12 mV
Lead Channel Setting Pacing Amplitude: 2.5 V
Lead Channel Setting Sensing Sensitivity: 2 mV
MDC IDC MSMT BATTERY REMAINING LONGEVITY: 123 mo
MDC IDC MSMT BATTERY VOLTAGE: 2.98 V
MDC IDC PG SERIAL: 7230364
MDC IDC SET LEADCHNL RV PACING PULSEWIDTH: 0.4 ms

## 2014-03-30 NOTE — Addendum Note (Signed)
Addended by: Leone Haven on: 03/30/2014 02:24 PM   Modules accepted: Orders

## 2014-03-30 NOTE — Telephone Encounter (Signed)
Spoke with [facility] and they do perform triple A ultrasound.  Can you please place order for [id number]?  They were unable to use the order you placed. Rojean Ige,CMA

## 2014-03-30 NOTE — Telephone Encounter (Signed)
LMOVM for pt to call back.  Please inform of the below appt:  04-01-14 @ 10:15  [facility] [address], [address] Phone: [phone] PT CANT HAVE ANYTHING TO EAT OR DRINK AFTER MIDNIGHT THE NIGHT BEFORE APPT.  Jahquez Steffler, Salome Spotted

## 2014-03-30 NOTE — Telephone Encounter (Signed)
Correct order placed for the [place]. Thanks.

## 2014-03-31 NOTE — Telephone Encounter (Signed)
Message delivered to caregiver

## 2014-04-01 ENCOUNTER — Ambulatory Visit
Admission: RE | Admit: 2014-04-01 | Discharge: 2014-04-01 | Disposition: A | Discharge status: Home / Self Care | Payer: Medicare Other | Source: Ambulatory Visit | Attending: Family Medicine | Admitting: Family Medicine

## 2014-04-01 DIAGNOSIS — I714 Abdominal aortic aneurysm, without rupture, unspecified: Secondary | ICD-10-CM

## 2014-04-02 ENCOUNTER — Encounter: Payer: Self-pay | Admitting: *Deleted

## 2014-04-12 ENCOUNTER — Encounter: Payer: Self-pay | Admitting: Internal Medicine

## 2014-04-13 ENCOUNTER — Encounter: Payer: Medicare Other | Admitting: Family Medicine

## 2014-04-30 ENCOUNTER — Ambulatory Visit (INDEPENDENT_AMBULATORY_CARE_PROVIDER_SITE_OTHER): Payer: Medicare Other | Admitting: Family Medicine

## 2014-04-30 ENCOUNTER — Encounter: Payer: Self-pay | Admitting: Family Medicine

## 2014-04-30 VITALS — BP 123/75 | HR 61 | Temp 98.1°F | Wt 155.0 lb

## 2014-04-30 DIAGNOSIS — N400 Enlarged prostate without lower urinary tract symptoms: Secondary | ICD-10-CM

## 2014-04-30 DIAGNOSIS — K5900 Constipation, unspecified: Secondary | ICD-10-CM | POA: Insufficient documentation

## 2014-04-30 DIAGNOSIS — M259 Joint disorder, unspecified: Secondary | ICD-10-CM

## 2014-04-30 HISTORY — DX: Joint disorder, unspecified: M25.9

## 2014-04-30 NOTE — Assessment & Plan Note (Signed)
Patient reports limpness in knees after uncrossing his legs. Suspect he is compressing the nerves that supply his lower legs when he crosses his legs and this results in his legs "falling asleep" as he has a normal neurological exam in his LE. Discussed this with the patient. Patient given exercises to help strengthen his legs. Advised to be careful when getting up from sitting. F/u prn.

## 2014-04-30 NOTE — Assessment & Plan Note (Signed)
Patient with continued "trouble holding water". Notes being on flomax for a period of time prior to our last visit. Notes he follows with Dr Kellie Simmering. Patient signed release of info for Dr Remi Deter office. Will review their records then make recs for treatment based on these records.

## 2014-04-30 NOTE — Progress Notes (Signed)
Patient ID: [name] [name], male   DOB: 1923/09/08, 80 y.o.   MRN: [id number]  [name] Rumps, MD Phone: [phone]  [name] [name] is a 80 y.o. male who presents today for f/u.  "trouble holding water": patient continues to report an issue with this. Notes waking up 2x/night to use the bathroom. Reports taking flomax, though this medication was not with the meds his daughter brought in. Notes this is not an issue unless he goes from a sitting to a standing position. Daughter reports he has been on flomax for some time and follows with Dr Kellie Simmering. Last saw him 2-3 months ago.  Constipation: is a chronic issue for the patient. He reports taking docusate daily and epsom salts occasionally for this issue. Reports currently having a BM every other day and they are soft. Denies bloody BM. Currently is stable on this regimen.  Weak Knees: patient notes knees are occasionally weak after he has had them crossed for an extended period of time. After he uncrosses them states they feel limp and then he is able to shake them out and they return to normal after walking around for a period of time. Notes no abnormalities at this time.  Patient is a former smoker.   ROS: Per HPI   Physical Exam Filed Vitals:   04/30/14 0851  BP: 123/75  Pulse: 61  Temp: 98.1 F (36.7 C)    Gen: Well NAD HEENT: EOMI,  PERRL Lungs: CTABL Nl WOB Heart: RRR no MRG MSK: bilateral knees without swelling or erythema, no tenderness at any point in the knees Neuro: 5/5 strength in bilateral hip flexors, quads, hamstrings, plantar and dorsiflexion, sensation to light touch intact in bilateral LE, gait normal with assistance of cane Exts: Non edematous BL  LE, warm and well perfused.    Assessment/Plan: Please see individual problem list.

## 2014-04-30 NOTE — Patient Instructions (Signed)
Nice to see you again. Please do the exercises provided to help strengthen your legs. Do the quadriseps isometrics, straight leg raise, and step-up. You should continue to use your current bowel regimen. If there is a change in the frequency of your bowel movements please let us know. We will obtain records from Dr Remi Deter office.

## 2014-04-30 NOTE — Assessment & Plan Note (Signed)
Issue is stable on current regimen. Advised to continue current regimen. To inform us if his BMs change.

## 2014-06-22 ENCOUNTER — Ambulatory Visit (INDEPENDENT_AMBULATORY_CARE_PROVIDER_SITE_OTHER): Payer: Medicare Other | Admitting: *Deleted

## 2014-06-22 DIAGNOSIS — R001 Bradycardia, unspecified: Secondary | ICD-10-CM

## 2014-06-22 DIAGNOSIS — I498 Other specified cardiac arrhythmias: Secondary | ICD-10-CM

## 2014-06-22 DIAGNOSIS — I4891 Unspecified atrial fibrillation: Secondary | ICD-10-CM

## 2014-06-22 DIAGNOSIS — I428 Other cardiomyopathies: Secondary | ICD-10-CM

## 2014-06-22 DIAGNOSIS — I4819 Other persistent atrial fibrillation: Secondary | ICD-10-CM

## 2014-06-22 NOTE — Progress Notes (Signed)
Remote pacemaker transmission.   

## 2014-07-02 LAB — MDC_IDC_ENUM_SESS_TYPE_REMOTE
Battery Remaining Longevity: 123 mo
Battery Remaining Percentage: 91 %
Battery Voltage: 2.98 V
Date Time Interrogation Session: 20150630074731
Implantable Pulse Generator Model: 1210
Implantable Pulse Generator Serial Number: 7230364
Lead Channel Pacing Threshold Amplitude: 0.75 V
Lead Channel Pacing Threshold Pulse Width: 0.4 ms
Lead Channel Setting Sensing Sensitivity: 2 mV
MDC IDC MSMT LEADCHNL RV IMPEDANCE VALUE: 580 Ohm
MDC IDC MSMT LEADCHNL RV SENSING INTR AMPL: 12 mV
MDC IDC SET LEADCHNL RV PACING AMPLITUDE: 2.5 V
MDC IDC SET LEADCHNL RV PACING PULSEWIDTH: 0.4 ms
MDC IDC STAT BRADY RV PERCENT PACED: 86 %

## 2014-07-13 ENCOUNTER — Encounter: Payer: Self-pay | Admitting: Cardiology

## 2014-07-15 ENCOUNTER — Other Ambulatory Visit: Payer: Self-pay | Admitting: Family Medicine

## 2014-07-20 ENCOUNTER — Telehealth: Payer: Self-pay | Admitting: Family Medicine

## 2014-07-20 NOTE — Telephone Encounter (Signed)
Patient was seen by Dr. Caryl Bis for his back pain and discuss with him about possibly getting injections for back pain. Patient now is interested in getting the back injections. Patient would like to know if he will need to come in and see Dr. Caryl Bis again? Please advise

## 2014-07-21 NOTE — Telephone Encounter (Signed)
No answer and no machine.  Will await callback. [name] [name], [name] [name]

## 2014-07-21 NOTE — Telephone Encounter (Signed)
Patient calls again, has not had a response. Please contact patient.

## 2014-07-21 NOTE — Telephone Encounter (Signed)
Please call and inform the patient that he will need to come in for a follow-up prior to a referral being made for back injections. Thanks.

## 2014-08-11 ENCOUNTER — Ambulatory Visit (INDEPENDENT_AMBULATORY_CARE_PROVIDER_SITE_OTHER): Payer: Medicare Other | Admitting: Family Medicine

## 2014-08-11 ENCOUNTER — Encounter: Payer: Self-pay | Admitting: Family Medicine

## 2014-08-11 VITALS — BP 94/60 | HR 60 | Ht 73.0 in | Wt 161.0 lb

## 2014-08-11 DIAGNOSIS — M545 Low back pain, unspecified: Secondary | ICD-10-CM

## 2014-08-11 DIAGNOSIS — K5900 Constipation, unspecified: Secondary | ICD-10-CM

## 2014-08-11 DIAGNOSIS — K219 Gastro-esophageal reflux disease without esophagitis: Secondary | ICD-10-CM

## 2014-08-11 MED ORDER — CAPSAICIN 0.025 % EX CREA: TOPICAL_CREAM | Freq: Two times a day (BID) | CUTANEOUS | Status: DC

## 2014-08-11 MED ORDER — DOCUSATE SODIUM 100 MG PO CAPS: 100.0000 mg | ORAL_CAPSULE | Freq: Every day | ORAL | Status: DC | PRN

## 2014-08-11 MED ORDER — PANTOPRAZOLE SODIUM 20 MG PO TBEC: DELAYED_RELEASE_TABLET | ORAL | Status: DC

## 2014-08-11 NOTE — Patient Instructions (Addendum)
Nice to see you. I have given you a prescription for docusate to try for your stooling issues. You should use the capsaicin cream on your back and use tylenol 1000 mg 3 times a day for the next few days to see if this helps. Continue to do your exercises.  I have written a prescription for reflux medication. See how much this costs and if it is too much please let us know.

## 2014-08-11 NOTE — Assessment & Plan Note (Signed)
Patient with continued back pain. No neurological deficits on exam. No red flags on history. Likely related to arthritic changes in his back seen on plane film earlier this year. Given his age many traditional choices for medication management are not appropriate. Will treat with capsaicin cream and scheduled tylenol. I will also refer for home PT. Patient did request back injections as these have previously helped, though I discussed the previously mentioned measures with the patient as the first step and if they do not work we could consider referral for injections. Patient to follow-up in 4 weeks to check for improvement.

## 2014-08-11 NOTE — Assessment & Plan Note (Signed)
Is stable at this time. Will send in protonix as this appears to be in a cheaper tier on his insurance plan. Will continue to follow prn.

## 2014-08-11 NOTE — Progress Notes (Signed)
Patient ID: [name] [name], male   DOB: Jun 13, 1923, 80 y.o.   MRN: [id number]  [name], MD Phone: [phone]  [name] [name] is a 80 y.o. male who presents today for f/u.  Back pain: notes this has continued to bother him. It keeps him up at night. It is not as bad during the day. It is low back pain and is a dull pain. No radiation. No LE weakness or numbness. No saddle anesthesia. No fever or history of cancer. He had plane film of lumbar spine earlier this year that revealed arthritic changes. Has not taken anything for this pain yet. Has in the distant past had injections in his back that were beneficial.   Constipation: he notes that he has a bowel movement mostly every other day. Sometimes less frequently. Notes the stool is normal in nature. He has been trying epsom salts previously with minimal benefit. He has tried prune juice with minimal benefit. Miralax with minimal benefit. No abdominal pain or blood in his stool.  GERD: notes occasional burning sensation and belching in the past. Has been on nexium until the last week and this was beneficial. He notes the nexium is too expensive. No sour taste. He would like something that is less expensive.   Patient is a former smoker.   ROS: Per HPI   Physical Exam Filed Vitals:   08/11/14 1038  BP: 94/60  Pulse: 60    Gen: Well NAD HEENT: PERRL,  MMM Lungs: CTABL Nl WOB Heart: RRR no MRG MSK: back with no tenderness or signs of swelling, there is no midline tenderness Neuro: 5/5 strength in bilateral quads, hamstrings, plantar and dorsiflexion, sensation to light touch intact in bilateral LE, normal gait, 2+ patellar reflexes Exts: Non edematous BL  LE, warm and well perfused.    Assessment/Plan: Please see individual problem list.  # Healthcare maintenance: need to follow-up on his immunizations next visit  [name], MD [place] PGY-3

## 2014-08-11 NOTE — Assessment & Plan Note (Signed)
Continues to have issues with this, though it does not sound like true constipation as he is having normal stools. Will give him a trial of docusate to see if this helps. It may be that he only has a BM every other day and that is his normal schedule. Will continue to follow this issue.

## 2014-08-18 ENCOUNTER — Telehealth: Payer: Self-pay | Admitting: Family Medicine

## 2014-08-18 NOTE — Telephone Encounter (Signed)
Herbert Deaner from [facility] calls, due to Dr. Caryl Bis not being [place] certified, PT orders must be completed and signed by an attending. Sumner Boast Dr. McDiarmid's name. WIll refax paperwork.

## 2014-08-19 ENCOUNTER — Encounter: Payer: Self-pay | Admitting: Internal Medicine

## 2014-09-01 ENCOUNTER — Telehealth: Payer: Self-pay | Admitting: Family Medicine

## 2014-09-01 NOTE — Telephone Encounter (Signed)
[facility] nurse called because she has been trying to visit patient and he is never home and he never answers his home. She finally talk to one of his caregivers but the said they would help her see patient and said they would call her back for an appointment.ake the appointment then when she goes no one is there. She is not sure how much longer she should do this. Please advise [phone] . jw

## 2014-09-02 NOTE — Telephone Encounter (Signed)
LM for [employer] to call back.  No other identifier was given so unsure if this is the advanced home care nurse who called originally.  Please give message from MD when she calls back and identifies herself. Jazmin Hartsell,CMA

## 2014-09-02 NOTE — Telephone Encounter (Signed)
I am seeing the patient in follow-up next week. I will discuss this with him at that time. Please inform [facility] to continue through 09/09/14 when his appointment is and once I discuss this with them we will let them know if they should continue services. Thanks.

## 2014-09-09 ENCOUNTER — Encounter: Payer: Self-pay | Admitting: Family Medicine

## 2014-09-09 ENCOUNTER — Ambulatory Visit (INDEPENDENT_AMBULATORY_CARE_PROVIDER_SITE_OTHER): Payer: Medicare Other | Admitting: Family Medicine

## 2014-09-09 VITALS — BP 104/73 | HR 65 | Temp 98.3°F | Wt 159.0 lb

## 2014-09-09 DIAGNOSIS — K219 Gastro-esophageal reflux disease without esophagitis: Secondary | ICD-10-CM

## 2014-09-09 DIAGNOSIS — Z23 Encounter for immunization: Secondary | ICD-10-CM

## 2014-09-09 DIAGNOSIS — M545 Low back pain, unspecified: Secondary | ICD-10-CM

## 2014-09-09 MED ORDER — PANTOPRAZOLE SODIUM 40 MG PO TBEC: DELAYED_RELEASE_TABLET | ORAL | Status: DC

## 2014-09-09 NOTE — Assessment & Plan Note (Addendum)
Patient with worsening of reflux recently. No red flags on history. Will increase protonix to 40 mg daily. Discussed dietary triggers to avoid. He is to stay upright for 30-60 minutes after eating. F/u in 4 weeks.

## 2014-09-09 NOTE — Assessment & Plan Note (Signed)
Much improved from previously. No red flags on history or exam. Will continue capsacin prn and to continue PT until they discharge him. To f/u prn or if develops new symptoms.

## 2014-09-09 NOTE — Progress Notes (Signed)
Patient ID: [name] [name], male   DOB: 05-Dec-1923, 80 y.o.   MRN: [id number]  [name], MD Phone: [phone]  [name] [name] is a 80 y.o. male who presents today for f/u.  GERD: patient reports slight worsening of reflux recently, specifically last night. Notes around midnight had burning in epigastric and lower chest regions. Also sour taste in back of mouth. Has this typically after he eats a heavy meal and then goes to lay down. He notes eating light meals helps. He has no shortness of breath or radiation or diaphoresis with this burning. Has been taking protonix 20 mg daily. No weight loss or blood in his stool.  Back pain: has improved. Still notes mild pain in lower back at night. Has been using cream prescribed at last visit and PT have helped quite a bit. Denies saddle anesthesia, fever, history of cancer.   Patient is a former smoker.   ROS: Per HPI   Physical Exam Filed Vitals:   09/09/14 1043  BP: 104/73  Pulse: 65  Temp: 98.3 F (36.8 C)    Gen: Well NAD HEENT: PERRL,  MMM Lungs: CTABL Nl WOB Heart: RRR no MRG Abd: soft, NT, ND MSK: no tenderness of back Neuro: 5/5 strength in bilateral quads, hamstrings, plantar and dorsiflexion, sensation to light touch intact in bilateral LE, 2+ patellar reflexes Exts: Non edematous BL  LE, warm and well perfused.    Assessment/Plan: Please see individual problem list.  # Healthcare maintenance: given flu shot today  [name], MD [place] PGY-3

## 2014-09-09 NOTE — Patient Instructions (Signed)
Nice to see you. We are going to increase your protonix dose. Please avoid fatty foods, caffeine, chocolate, spicy foods, food with high fat content, carbonated beverages, acidic foods, and peppermint. Please stay upright for 30-60 minutes after eating.  I will see you back in 4 weeks to see if this is improved.

## 2014-09-21 ENCOUNTER — Emergency Department (HOSPITAL_COMMUNITY): Payer: Medicare Other

## 2014-09-21 ENCOUNTER — Emergency Department (HOSPITAL_COMMUNITY)
Admission: EM | Admit: 2014-09-21 | Discharge: 2014-09-21 | Disposition: A | Discharge status: Home / Self Care | Payer: Medicare Other | Attending: Emergency Medicine | Admitting: Emergency Medicine

## 2014-09-21 ENCOUNTER — Encounter (HOSPITAL_COMMUNITY): Payer: Self-pay | Admitting: Emergency Medicine

## 2014-09-21 DIAGNOSIS — I509 Heart failure, unspecified: Secondary | ICD-10-CM | POA: Insufficient documentation

## 2014-09-21 DIAGNOSIS — Z950 Presence of cardiac pacemaker: Secondary | ICD-10-CM | POA: Diagnosis not present

## 2014-09-21 DIAGNOSIS — I4891 Unspecified atrial fibrillation: Secondary | ICD-10-CM | POA: Insufficient documentation

## 2014-09-21 DIAGNOSIS — K219 Gastro-esophageal reflux disease without esophagitis: Secondary | ICD-10-CM | POA: Diagnosis not present

## 2014-09-21 DIAGNOSIS — Z79899 Other long term (current) drug therapy: Secondary | ICD-10-CM | POA: Diagnosis not present

## 2014-09-21 DIAGNOSIS — Z8659 Personal history of other mental and behavioral disorders: Secondary | ICD-10-CM | POA: Diagnosis not present

## 2014-09-21 DIAGNOSIS — I10 Essential (primary) hypertension: Secondary | ICD-10-CM | POA: Insufficient documentation

## 2014-09-21 DIAGNOSIS — M129 Arthropathy, unspecified: Secondary | ICD-10-CM | POA: Insufficient documentation

## 2014-09-21 DIAGNOSIS — Z8669 Personal history of other diseases of the nervous system and sense organs: Secondary | ICD-10-CM | POA: Diagnosis not present

## 2014-09-21 DIAGNOSIS — Z9849 Cataract extraction status, unspecified eye: Secondary | ICD-10-CM | POA: Diagnosis not present

## 2014-09-21 DIAGNOSIS — Z7982 Long term (current) use of aspirin: Secondary | ICD-10-CM | POA: Diagnosis not present

## 2014-09-21 DIAGNOSIS — Z8709 Personal history of other diseases of the respiratory system: Secondary | ICD-10-CM | POA: Insufficient documentation

## 2014-09-21 DIAGNOSIS — Z87891 Personal history of nicotine dependence: Secondary | ICD-10-CM | POA: Diagnosis not present

## 2014-09-21 LAB — CBC WITH DIFFERENTIAL/PLATELET
BASOS PCT: 1 % (ref 0–1)
Basophils Absolute: 0 10*3/uL (ref 0.0–0.1)
EOS ABS: 0.1 10*3/uL (ref 0.0–0.7)
EOS PCT: 5 % (ref 0–5)
HCT: 40.4 % (ref 39.0–52.0)
HEMOGLOBIN: 13.7 g/dL (ref 13.0–17.0)
Lymphocytes Relative: 50 % — ABNORMAL HIGH (ref 12–46)
Lymphs Abs: 1.5 10*3/uL (ref 0.7–4.0)
MCH: 24.7 pg — ABNORMAL LOW (ref 26.0–34.0)
MCHC: 33.9 g/dL (ref 30.0–36.0)
MCV: 72.8 fL — ABNORMAL LOW (ref 78.0–100.0)
Monocytes Absolute: 0.3 10*3/uL (ref 0.1–1.0)
Monocytes Relative: 10 % (ref 3–12)
NEUTROS PCT: 34 % — AB (ref 43–77)
Neutro Abs: 1 10*3/uL — ABNORMAL LOW (ref 1.7–7.7)
Platelets: 152 10*3/uL (ref 150–400)
RBC: 5.55 MIL/uL (ref 4.22–5.81)
RDW: 16.3 % — ABNORMAL HIGH (ref 11.5–15.5)
WBC: 2.9 10*3/uL — ABNORMAL LOW (ref 4.0–10.5)

## 2014-09-21 LAB — BASIC METABOLIC PANEL
Anion gap: 12 (ref 5–15)
BUN: 17 mg/dL (ref 6–23)
CO2: 19 mEq/L (ref 19–32)
Calcium: 9 mg/dL (ref 8.4–10.5)
Chloride: 107 mEq/L (ref 96–112)
Creatinine, Ser: 1.3 mg/dL (ref 0.50–1.35)
GFR calc Af Amer: 54 mL/min — ABNORMAL LOW (ref >90)
GFR calc non Af Amer: 47 mL/min — ABNORMAL LOW (ref >90)
GLUCOSE: 92 mg/dL (ref 70–99)
POTASSIUM: 4.6 meq/L (ref 3.7–5.3)
SODIUM: 138 meq/L (ref 137–147)

## 2014-09-21 LAB — TROPONIN I: Troponin I: <0.3 ng/mL (ref <0.30)

## 2014-09-21 MED ORDER — GI COCKTAIL ~~LOC~~
30.0000 mL | Freq: Once | ORAL | Status: AC
  Administered 2014-09-21: 30 mL via ORAL
  Filled 2014-09-21: qty 30

## 2014-09-21 NOTE — ED Provider Notes (Signed)
CSN: [id number]     Arrival date & time 09/21/14  7510 History   First MD Initiated Contact with Patient 09/21/14 [phone]     Chief Complaint  Patient presents with  . Gastrophageal Reflux     (Consider location/radiation/quality/duration/timing/severity/associated sxs/prior Treatment) HPI Comments: Patient is an 80 year old male with a past medical history of hypertension, CHF, GERD, afib, and cardiomyopathy who presents with indigestion for the past 4 days. Patient reports a burning sensation in his central chest without radiation. Symptoms started gradually and remained constant since the onset. No other associated symptoms. No aggravating/alleviating factors. Patient has had this before and reports relief with gi cocktail.    Past Medical History  Diagnosis Date  . Hypertension   . CHF (congestive heart failure)   . GERD (gastroesophageal reflux disease)   . Recurrent upper respiratory infection (URI)     recent cold- treating /w OTC  . Arthritis     "legs are stiff" , had spinal injections for pain relieve 3-4 yrs. ago  . Atrial fibrillation-permanent   . Constipation   . Depression   . Nonischemic/ ischemic cardiomyopathy     Single-vessel coronary disease catheterization 2004-Myoview showing inferior wall perfusion defect Myoview 2006  . Subdural hematoma     Prior evacuation  . Cataract   . Bradycardia   . Diverticulosis   . AAA (abdominal aortic aneurysm) without rupture     10/2012 CT scan: 4.3x4.6cm   Past Surgical History  Procedure Laterality Date  . Evacuation of subdural hematoma    . Eye surgery      cataract extracted in L eye  . Hernia repair      R side inguinal repair   . Brain surgery      2002  . Tonsillectomy      1957  . Cataract extraction w/phaco  01/30/2012    Procedure: CATARACT EXTRACTION PHACO AND INTRAOCULAR LENS PLACEMENT (IOC);  Surgeon: Adonis Brook, MD;  Location: [facility];  Service: Ophthalmology;  Laterality: Right;  . Pars plana vitrectomy   07/30/2012    Procedure: PARS PLANA VITRECTOMY WITH 23 GAUGE;  Surgeon: Adonis Brook, MD;  Location: [facility];  Service: Ophthalmology;  Laterality: Right;  Insertion of Silicone Oil  . Gas/fluid exchange  07/30/2012    Procedure: GAS/FLUID EXCHANGE;  Surgeon: Adonis Brook, MD;  Location: [facility];  Service: Ophthalmology;  Laterality: Right;  . Pacemaker insertion  08/26/12    SJM Accent SR RF pacemaker   Family History  Problem Relation Age of Onset  . Anesthesia problems Neg Hx   . Hypotension Neg Hx   . Malignant hyperthermia Neg Hx   . Pseudochol deficiency Neg Hx    History  Substance Use Topics  . Smoking status: Former Smoker -- 0.25 packs/day for 15 years    Quit date: 12/24/1994  . Smokeless tobacco: Never Used  . Alcohol Use: No    Review of Systems  Constitutional: Negative for fever, chills and fatigue.  HENT: Negative for trouble swallowing.   Eyes: Negative for visual disturbance.  Respiratory: Negative for shortness of breath.   Cardiovascular: Negative for chest pain and palpitations.  Gastrointestinal: Positive for abdominal pain. Negative for nausea, vomiting and diarrhea.  Genitourinary: Negative for dysuria and difficulty urinating.  Musculoskeletal: Negative for arthralgias and neck pain.  Skin: Negative for color change.  Neurological: Negative for dizziness and weakness.  Psychiatric/Behavioral: Negative for dysphoric mood.      Allergies  Review of patient's allergies  indicates no known allergies.  Home Medications   Prior to Admission medications   Medication Sig Start Date End Date Taking? Authorizing Provider  aspirin EC 81 MG tablet Take 81 mg by mouth daily.   Yes Historical Provider, MD  carvedilol (COREG) 3.125 MG tablet Take 1 tablet (3.125 mg total) by mouth 2 (two) times daily with a meal. 12/10/13  Yes Clent Demark, MD  docusate sodium (COLACE) 100 MG capsule Take 1 capsule (100 mg total) by mouth daily as needed for mild constipation.  08/11/14  Yes Leone Haven, MD  Multiple Vitamin (MULTIVITAMIN WITH MINERALS) TABS Take 1 tablet by mouth daily.   Yes Historical Provider, MD  pantoprazole (PROTONIX) 40 MG tablet TAKE 1 TABLET BY MOUTH EVERY DAY 09/09/14  Yes Leone Haven, MD  Probiotic Product (PROBIOTIC DAILY PO) Take 1 capsule by mouth daily.   Yes Historical Provider, MD  Pseudoeph-Doxylamine-DM-APAP (NYQUIL PO) Take 30 mLs by mouth at bedtime as needed (sleep).   Yes Historical Provider, MD  ramipril (ALTACE) 1.25 MG capsule Take 1 capsule (1.25 mg total) by mouth daily. 12/10/13  Yes Clent Demark, MD   BP 105/70  Pulse 60  Temp(Src) 97.6 F (36.4 C) (Oral)  Resp 13  SpO2 98% Physical Exam  Nursing note and vitals reviewed. Constitutional: He appears well-developed and well-nourished. No distress.  HENT:  Head: Normocephalic and atraumatic.  Eyes: Conjunctivae are normal.  Neck: Normal range of motion.  Cardiovascular: Normal rate and regular rhythm.  Exam reveals no gallop and no friction rub.   No murmur heard. Pulmonary/Chest: Effort normal. He has no wheezes. He has no rales. He exhibits no tenderness.  Mild rhonchi noted in bilateral lungs.   Abdominal: Soft. He exhibits no distension. There is no tenderness. There is no rebound.  Musculoskeletal: Normal range of motion.  Neurological: He is alert.  Speech is goal-oriented. Moves limbs without ataxia.   Skin: Skin is warm and dry.  Psychiatric: He has a normal mood and affect. His behavior is normal.    ED Course  Procedures (including critical care time) Labs Review Labs Reviewed  CBC WITH DIFFERENTIAL  BASIC METABOLIC PANEL  Randolm Idol, ED    Imaging Review Dg Chest 2 View  09/21/2014   CLINICAL DATA:  Chest pain, hypertension  EXAM: CHEST - 2 VIEW  COMPARISON:  12/07/2013  FINDINGS: Stable cardiomegaly without CHF or pneumonia. Left subclavian single lead pacer noted. Lungs remain clear. No focal pneumonia, collapse or  consolidation. Chronic right hemidiaphragm elevation. No effusion or pneumothorax. Trachea midline. Degenerative changes of both shoulders. Atherosclerosis noted of the aorta.  IMPRESSION: Stable chronic chest findings as above. No superimposed acute process   Electronically Signed   By: Daryll Brod M.D.   On: 09/21/2014 11:18     EKG Interpretation None      MDM   Final diagnoses:  Gastroesophageal reflux disease without esophagitis    11:25 AM Labs, troponin and chest xray pending. Vitals stable and patient afebrile. Patient given GI cocktail.   1:48 PM Patient's labs and chest xray unremarkable for acute changes. Patient's "acid reflux" has been constant for the past 4 days and is not likely cardiac in etiology. Patient will be discharged with PCP follow up. Vitals stable and patient afebrile.   Alvina Chou, PA-C 09/21/14 1356

## 2014-09-21 NOTE — ED Notes (Signed)
Pt complains of acid reflux. Has "been treated in the past with a GI concktail."

## 2014-09-21 NOTE — Discharge Instructions (Signed)
Follow up with your doctor for further evaluation. Return to the ED with worsening or concerning symptoms.  °

## 2014-09-21 NOTE — ED Provider Notes (Signed)
9:34 AM  Date: 09/21/2014  Rate: 60  Rhythm: atrial fibrillation  QRS Axis: left axis deviation  Intervals: nonspecific intraventricular conduction delay  ST/T Wave abnormalities: nonspecific ST/T changes  Conduction Disutrbances:NIVCD  Narrative Interpretation:   Old EKG Reviewed: since previous tracing pacer spikes are no longer evident   Threasa Beards, MD 09/21/14 1448

## 2014-09-21 NOTE — ED Provider Notes (Addendum)
Medical screening examination/treatment/procedure(s) were conducted as a shared visit with non-physician practitioner(s) and myself.  I personally evaluated the patient during the encounter.   EKG Interpretation None        EKG Interpretation None      Pt seen and evaluated, he is awake and alert, normal work of breathing, states he feels improved, plan for discharge.  Threasa Beards, MD 09/21/14 [place], MD 09/21/14 [phone]

## 2014-09-22 LAB — PATHOLOGIST SMEAR REVIEW

## 2014-09-23 ENCOUNTER — Telehealth: Payer: Self-pay | Admitting: *Deleted

## 2014-09-23 NOTE — Telephone Encounter (Signed)
Patient should be seen in the the office for his reflux if I have any sooner appointments. Verbal order for PT can be given.

## 2014-09-23 NOTE — Telephone Encounter (Signed)
LMOVM for [name] [name] to return call.  Please inform that:  1. MD is giving his verbal order to extend PT  2. CMA attempted to call pt X3 and have him come in on one of Dr. Ellen Henri same day clinics but pt would pick up and immediatly hang up [employer]

## 2014-09-23 NOTE — Telephone Encounter (Signed)
Same day clinic would be fine.

## 2014-09-23 NOTE — Telephone Encounter (Signed)
[name] [name] physical therapist from [facility] is requesting verbal orders to continue 2X week for 2 weeks.  He also wanted the MD to know pt has been to the ED 3X recently for GERD and wanted to know if MD wanted to move appt up from 10/12/2014. [place], Salome Spotted

## 2014-09-23 NOTE — Telephone Encounter (Signed)
I will call to give verbal order.  You do not have any opens before that unless you want him seen in Same day clinic. Khalila Buechner, Salome Spotted

## 2014-10-01 ENCOUNTER — Encounter: Payer: Self-pay | Admitting: *Deleted

## 2014-10-04 ENCOUNTER — Telehealth: Payer: Self-pay | Admitting: *Deleted

## 2014-10-04 NOTE — Telephone Encounter (Signed)
Form signed and placed in the to fax box.

## 2014-10-04 NOTE — Telephone Encounter (Signed)
Herbert Deaner, Physical Therapist with [facility] called needing forms signed regarding physical therapy.  He would like to know if he could continue home physical therapy that is mentioned in the fax.  Faxes placed in provider box for review.  Faxes originally sent on 09/26/2014. Please review and fax back as soon as possible.  Please give Clair Gulling a call if questions at [phone].  Derl Barrow, RN

## 2014-10-12 ENCOUNTER — Encounter: Payer: Self-pay | Admitting: Family Medicine

## 2014-10-12 ENCOUNTER — Ambulatory Visit (INDEPENDENT_AMBULATORY_CARE_PROVIDER_SITE_OTHER): Payer: Medicare Other | Admitting: Family Medicine

## 2014-10-12 VITALS — BP 114/73 | HR 60 | Wt 164.0 lb

## 2014-10-12 DIAGNOSIS — M545 Low back pain, unspecified: Secondary | ICD-10-CM

## 2014-10-12 DIAGNOSIS — K219 Gastro-esophageal reflux disease without esophagitis: Secondary | ICD-10-CM

## 2014-10-12 DIAGNOSIS — K5900 Constipation, unspecified: Secondary | ICD-10-CM

## 2014-10-12 NOTE — Assessment & Plan Note (Addendum)
Patient with GERD strictly at night. No redflags on history today. Advised to change PPI dosing to one hour prior to bed. He is to elevate the head of his bed 6-8 inches. Given dietary triggers to avoid. Can use mylanta if he wakes up with GERD. If this does not improve his symptoms will need to consider further work up. F/u one month.

## 2014-10-12 NOTE — Patient Instructions (Addendum)
Nice to see you. Please take your protonix one hour prior to bed time. Please elevate the head of your bed. You can use mylanta after waking up with reflux. Avoid caffeine, mint, spicy foods, and chocolate. I will see you back in 2 months.

## 2014-10-12 NOTE — Assessment & Plan Note (Signed)
Patient has sensation of constipation, though does not have true constipation. I advised him of this today. Advised to start fiber supplement daily. If he goes 3+ days with out a BM or develops hard balls of stool he is to let us know. Will continue to monitor.

## 2014-10-12 NOTE — Assessment & Plan Note (Signed)
Much improved. No neurological deficits. Continue PT at this time. F/u for this issue if develops new symptoms or worsening back pain.

## 2014-10-12 NOTE — Progress Notes (Signed)
Patient ID: [name] [name], male   DOB: [birth year], 80 y.o.   MRN: [id number]  [name], MD Phone: [phone]  [name] [name] is a 80 y.o. male who presents today for f/u.  GERD: notes continued issues with GERD. Bothers him only at 3-4 am after being asleep. Notes burning in epigastric region up to chest at that time with sour taste. Has been to the ED for this and had a negative work up. Does not bother him during the day. He has eliminated caffeine from his diet. He had not been able to pin point a dietary trigger, though notes if he eats early in the evening he does not have many issues. No dysphagia or odynophagia. No weight loss. No NSAIDs.  Back pain: much improved. Does not bother him much at this time. He notes it is slightly worse and stiff in the morning though improves with movement. PT was helpful. Denies saddle anesthesia, fever, history of cancer.   Constipation: reports constipation today. He has BMs every 1-2 days. They are soft and he does not have to strain. Denies hard balls of stool. No abdominal discomfort. He uses OTC medication for this that he does not know the name of.   Patient is a nonsmoker.   ROS: Per HPI   Physical Exam Filed Vitals:   10/12/14 1042  BP: 114/73  Pulse: 60    Gen: Well NAD Lungs: CTABL Nl WOB Heart: RRR no MRG Abd: soft, NT, ND MSK: no tenderness of back  Neuro: 5/5 strength in bilateral quads, hamstrings, plantar and dorsiflexion, sensation to light touch intact in bilateral LE, 2+ patellar reflexes Exts: Non edematous BL  LE, warm and well perfused.    Assessment/Plan: Please see individual problem list.  # Healthcare maintenance: needs tdap at next visit  [name], MD [place] PGY-3

## 2014-11-23 ENCOUNTER — Telehealth: Payer: Self-pay | Admitting: Family Medicine

## 2014-11-23 NOTE — Telephone Encounter (Signed)
Herbert Deaner from [facility] called and wanted to let the doctor know that the patient has put off is home care and PT for about a week because of the home repairs being done. If you have any questions please call Clair Gulling at [phone]. Blima Rich

## 2014-11-23 NOTE — Telephone Encounter (Signed)
Noted  

## 2014-12-02 ENCOUNTER — Encounter (HOSPITAL_COMMUNITY): Payer: Self-pay | Admitting: Internal Medicine

## 2014-12-14 ENCOUNTER — Encounter: Payer: Self-pay | Admitting: Internal Medicine

## 2014-12-18 ENCOUNTER — Other Ambulatory Visit: Payer: Self-pay | Admitting: Family Medicine

## 2014-12-29 ENCOUNTER — Encounter: Payer: Self-pay | Admitting: Internal Medicine

## 2015-01-05 ENCOUNTER — Encounter: Payer: Medicare Other | Admitting: Internal Medicine

## 2015-01-06 ENCOUNTER — Ambulatory Visit (INDEPENDENT_AMBULATORY_CARE_PROVIDER_SITE_OTHER): Payer: Medicare Other | Admitting: Internal Medicine

## 2015-01-06 ENCOUNTER — Encounter: Payer: Self-pay | Admitting: Internal Medicine

## 2015-01-06 VITALS — BP 116/68 | HR 60 | Ht 73.0 in | Wt 167.0 lb

## 2015-01-06 DIAGNOSIS — Z950 Presence of cardiac pacemaker: Secondary | ICD-10-CM

## 2015-01-06 DIAGNOSIS — R001 Bradycardia, unspecified: Secondary | ICD-10-CM

## 2015-01-06 DIAGNOSIS — I482 Chronic atrial fibrillation, unspecified: Secondary | ICD-10-CM

## 2015-01-06 LAB — MDC_IDC_ENUM_SESS_TYPE_INCLINIC
Battery Voltage: 2.98 V
Brady Statistic RV Percent Paced: 88 %
Lead Channel Impedance Value: 550 Ohm
Lead Channel Pacing Threshold Pulse Width: 0.4 ms
Lead Channel Sensing Intrinsic Amplitude: 8.7 mV
Lead Channel Setting Pacing Amplitude: 2.5 V
Lead Channel Setting Sensing Sensitivity: 2 mV
MDC IDC MSMT BATTERY REMAINING LONGEVITY: 133.2 mo
MDC IDC MSMT LEADCHNL RV PACING THRESHOLD AMPLITUDE: 0.75 V
MDC IDC PG SERIAL: 7230364
MDC IDC SESS DTM: 20160114115802
MDC IDC SET LEADCHNL RV PACING PULSEWIDTH: 0.4 ms

## 2015-01-06 NOTE — Progress Notes (Signed)
Electrophysiology Office Note   Date:  01/06/2015   ID:  [name] [name], DOB [birth year], MRN [id number]  PCP:  [name], MD  Cardiologist:  Dr Terrence Dupont   Chief Complaint  Patient presents with  . unsteadiness     History of Present Illness: [name] [name] is a 81 y.o. male who presents today for electrophysiology evaluation.   He has done well since his last visit.  His primary concern is with chronic reflux.  He denies CP, SOB.  He has unsteadiness and dizziness.  Dr Terrence Dupont has stopped anticoagulation for AF.  Today, he denies symptoms of palpitations,   lower extremity edema, claudication, presyncope, syncope, bleeding, or neurologic sequela. The patient is tolerating medications without difficulties and is otherwise without complaint today.   Past Medical History  Diagnosis Date  . Hypertension   . CHF (congestive heart failure)   . GERD (gastroesophageal reflux disease)   . Recurrent upper respiratory infection (URI)     recent cold- treating /w OTC  . Arthritis     "legs are stiff" , had spinal injections for pain relieve 3-4 yrs. ago  . Atrial fibrillation-permanent   . Constipation   . Depression   . Nonischemic/ ischemic cardiomyopathy     Single-vessel coronary disease catheterization 2004-Myoview showing inferior wall perfusion defect Myoview 2006  . Subdural hematoma     Prior evacuation  . Cataract   . Bradycardia   . Diverticulosis   . AAA (abdominal aortic aneurysm) without rupture     10/2012 CT scan: 4.3x4.6cm   Past Surgical History  Procedure Laterality Date  . Evacuation of subdural hematoma    . Eye surgery      cataract extracted in L eye  . Hernia repair      R side inguinal repair   . Brain surgery      2002  . Tonsillectomy      1957  . Cataract extraction w/phaco  01/30/2012    Procedure: CATARACT EXTRACTION PHACO AND INTRAOCULAR LENS PLACEMENT (IOC);  Surgeon: Adonis Brook, MD;  Location: [facility];  Service: Ophthalmology;   Laterality: Right;  . Pars plana vitrectomy  07/30/2012    Procedure: PARS PLANA VITRECTOMY WITH 23 GAUGE;  Surgeon: Adonis Brook, MD;  Location: [facility];  Service: Ophthalmology;  Laterality: Right;  Insertion of Silicone Oil  . Gas/fluid exchange  07/30/2012    Procedure: GAS/FLUID EXCHANGE;  Surgeon: Adonis Brook, MD;  Location: [facility];  Service: Ophthalmology;  Laterality: Right;  . Pacemaker insertion  08/26/12    SJM Accent SR RF pacemaker  . Permanent pacemaker insertion N/A 08/26/2012    Procedure: PERMANENT PACEMAKER INSERTION;  Surgeon: Thompson Grayer, MD;  Location[facility] C[facility];  Service: Cardiovascular;  Laterality: N/A;     Current Outpatient Prescriptions  Medication Sig Dispense Refill  . aspirin EC 81 MG tablet Take 81 mg by mouth daily.    . capsaicin (ZOSTRIX) 0.025 % cream APPLY TOPICALLY 2 (TWO) TIMES DAILY. FOR 7 DAYS. 60 g 0  . carvedilol (COREG) 3.125 MG tablet Take 1 tablet (3.125 mg total) by mouth 2 (two) times daily with a meal. 60 tablet 3  . docusate sodium (COLACE) 100 MG capsule Take 1 capsule (100 mg total) by mouth daily as needed for mild constipation. 10 capsule 0  . Multiple Vitamin (MULTIVITAMIN WITH MINERALS) TABS Take 1 tablet by mouth daily.    . pantoprazole (PROTONIX) 40 MG tablet TAKE 1 TABLET BY MOUTH EVERY DAY 30 tablet  3  . Probiotic Product (PROBIOTIC DAILY PO) Take 1 capsule by mouth daily.    . Pseudoeph-Doxylamine-DM-APAP (NYQUIL PO) Take 30 mLs by mouth at bedtime as needed (sleep).    . ramipril (ALTACE) 1.25 MG capsule Take 1 capsule (1.25 mg total) by mouth daily. 30 capsule 3   No current facility-administered medications for this visit.    Allergies:   Review of patient's allergies indicates no known allergies.   Social History:  The patient  reports that he quit smoking about 20 years ago. He has never used smokeless tobacco. He reports that he does not drink alcohol or use illicit drugs.   Family History:  The patient's family history is  negative for Anesthesia problems, Hypotension, Malignant hyperthermia, and Pseudochol deficiency.    ROS:  Please see the history of present illness.   All other systems are reviewed and negative.    PHYSICAL EXAM: VS:  BP 116/68 mmHg  Pulse 60  Ht 6\' 1"  (1.854 m)  Wt 167 lb (75.751 kg)  BMI 22.04 kg/m2 , BMI Body mass index is 22.04 kg/(m^2). GEN: elderly, walks slowly with a cane, in no acute distress HEENT: normal Neck: no JVD, carotid bruits, or masses Cardiac: RRR (paced); no murmurs, rubs, or gallops,no edema  Respiratory:  clear to auscultation bilaterally, normal work of breathing GI: soft, nontender, nondistended, + BS MS: no deformity or atrophy Skin: warm and dry,   device pocket is well healed Neuro:  Strength and sensation are intact Psych: euthymic mood, full affect  EKG:  EKG is not ordered today.    Device interrogation is reviewed today in detail.  See PaceArt for details.  Recent Labs: 09/21/2014: BUN 17; Creatinine 1.30; Hemoglobin 13.7; Platelets 152; Potassium 4.6; Sodium 138  No results found for requested labs within last 365 days.      Wt Readings from Last 3 Encounters:  01/06/15 167 lb (75.751 kg)  10/12/14 164 lb (74.39 kg)  09/09/14 159 lb (72.122 kg)      ASSESSMENT AND PLAN:  1.  Symptomatic bradycardia Normal pacemaker function See Pace Art report No changes today  2. Permanent afib chads2vasc score is at least 5.  Dr Terrence Dupont has stopped anticoagulation due to unsteadiness.  This may be best No changes today    Current medicines are reviewed at length with the patient today.  The patient is without any concerns regarding medicines and no changes are made today.    Disposition:   FU with me in 1 year merlin   Army Fossa MD 01/06/2015 11:57 AM      [facility] [place] [address]  [phone] (office) [phone] (fax)

## 2015-01-06 NOTE — Patient Instructions (Signed)
Your physician wants you to follow-up in: 12 months with Dr. Vallery Ridge will receive a reminder letter in the mail two months in advance. If you don't receive a letter, please call our office to schedule the follow-up appointment.   Remote monitoring is used to monitor your Pacemaker or ICD from home. This monitoring reduces the number of office visits required to check your device to one time per year. It allows [place] to keep an eye on the functioning of your device to ensure it is working properly. You are scheduled for a device check from home on 04/07/15. You may send your transmission at any time that day. If you have a wireless device, the transmission will be sent automatically. After your physician reviews your transmission, you will receive a postcard with your next transmission date.

## 2015-01-13 ENCOUNTER — Other Ambulatory Visit: Payer: Self-pay | Admitting: Family Medicine

## 2015-01-17 ENCOUNTER — Encounter: Payer: Self-pay | Admitting: Internal Medicine

## 2015-02-01 ENCOUNTER — Other Ambulatory Visit: Payer: Self-pay | Admitting: Family Medicine

## 2015-04-03 ENCOUNTER — Encounter (HOSPITAL_COMMUNITY): Payer: Self-pay | Admitting: Emergency Medicine

## 2015-04-03 ENCOUNTER — Emergency Department (HOSPITAL_COMMUNITY)
Admission: EM | Admit: 2015-04-03 | Discharge: 2015-04-03 | Disposition: A | Discharge status: Home / Self Care | Payer: Medicare Other | Attending: Emergency Medicine | Admitting: Emergency Medicine

## 2015-04-03 DIAGNOSIS — Z79899 Other long term (current) drug therapy: Secondary | ICD-10-CM | POA: Diagnosis not present

## 2015-04-03 DIAGNOSIS — I482 Chronic atrial fibrillation: Secondary | ICD-10-CM | POA: Diagnosis not present

## 2015-04-03 DIAGNOSIS — Z9849 Cataract extraction status, unspecified eye: Secondary | ICD-10-CM | POA: Diagnosis not present

## 2015-04-03 DIAGNOSIS — Z8659 Personal history of other mental and behavioral disorders: Secondary | ICD-10-CM | POA: Insufficient documentation

## 2015-04-03 DIAGNOSIS — I10 Essential (primary) hypertension: Secondary | ICD-10-CM | POA: Diagnosis not present

## 2015-04-03 DIAGNOSIS — I509 Heart failure, unspecified: Secondary | ICD-10-CM | POA: Insufficient documentation

## 2015-04-03 DIAGNOSIS — M1990 Unspecified osteoarthritis, unspecified site: Secondary | ICD-10-CM | POA: Insufficient documentation

## 2015-04-03 DIAGNOSIS — K219 Gastro-esophageal reflux disease without esophagitis: Secondary | ICD-10-CM | POA: Insufficient documentation

## 2015-04-03 DIAGNOSIS — Z7982 Long term (current) use of aspirin: Secondary | ICD-10-CM | POA: Insufficient documentation

## 2015-04-03 DIAGNOSIS — R079 Chest pain, unspecified: Secondary | ICD-10-CM | POA: Diagnosis present

## 2015-04-03 DIAGNOSIS — Z950 Presence of cardiac pacemaker: Secondary | ICD-10-CM | POA: Diagnosis not present

## 2015-04-03 DIAGNOSIS — Z87891 Personal history of nicotine dependence: Secondary | ICD-10-CM | POA: Insufficient documentation

## 2015-04-03 DIAGNOSIS — Z8709 Personal history of other diseases of the respiratory system: Secondary | ICD-10-CM | POA: Insufficient documentation

## 2015-04-03 LAB — CBC WITH DIFFERENTIAL/PLATELET
Basophils Absolute: 0 10*3/uL (ref 0.0–0.1)
Basophils Relative: 1 % (ref 0–1)
Eosinophils Absolute: 0.2 10*3/uL (ref 0.0–0.7)
Eosinophils Relative: 6 % — ABNORMAL HIGH (ref 0–5)
HCT: 41.5 % (ref 39.0–52.0)
Hemoglobin: 13.4 g/dL (ref 13.0–17.0)
Lymphocytes Relative: 49 % — ABNORMAL HIGH (ref 12–46)
Lymphs Abs: 1.6 10*3/uL (ref 0.7–4.0)
MCH: 24.3 pg — ABNORMAL LOW (ref 26.0–34.0)
MCHC: 32.3 g/dL (ref 30.0–36.0)
MCV: 75.3 fL — ABNORMAL LOW (ref 78.0–100.0)
Monocytes Absolute: 0.3 10*3/uL (ref 0.1–1.0)
Monocytes Relative: 10 % (ref 3–12)
Neutro Abs: 1.1 10*3/uL — ABNORMAL LOW (ref 1.7–7.7)
Neutrophils Relative %: 35 % — ABNORMAL LOW (ref 43–77)
Platelets: 153 10*3/uL (ref 150–400)
RBC: 5.51 MIL/uL (ref 4.22–5.81)
RDW: 17.3 % — ABNORMAL HIGH (ref 11.5–15.5)
WBC: 3.3 10*3/uL — ABNORMAL LOW (ref 4.0–10.5)

## 2015-04-03 LAB — I-STAT TROPONIN, ED: TROPONIN I, POC: 0.02 ng/mL (ref 0.00–0.08)

## 2015-04-03 LAB — BASIC METABOLIC PANEL
Anion gap: 9 (ref 5–15)
BUN: 14 mg/dL (ref 6–23)
CO2: 21 mmol/L (ref 19–32)
Calcium: 8.8 mg/dL (ref 8.4–10.5)
Chloride: 107 mmol/L (ref 96–112)
Creatinine, Ser: 1.27 mg/dL (ref 0.50–1.35)
GFR calc Af Amer: 56 mL/min — ABNORMAL LOW (ref >90)
GFR calc non Af Amer: 48 mL/min — ABNORMAL LOW (ref >90)
Glucose, Bld: 79 mg/dL (ref 70–99)
Potassium: 4.6 mmol/L (ref 3.5–5.1)
Sodium: 137 mmol/L (ref 135–145)

## 2015-04-03 MED ORDER — GI COCKTAIL ~~LOC~~
30.0000 mL | Freq: Once | ORAL | Status: AC
  Administered 2015-04-03: 30 mL via ORAL
  Filled 2015-04-03: qty 30

## 2015-04-03 NOTE — ED Notes (Signed)
Pt is in stable condition upon d/c and caregiver (Ms. Berdine Addison) was called to come and pick up the patient.

## 2015-04-03 NOTE — ED Notes (Signed)
Pt. Stated, I've had some burning in my chest for the last couple of days.  I have acid reflux. I also have a pacemaker

## 2015-04-03 NOTE — ED Provider Notes (Signed)
CSN: [id number]     Arrival date & time 04/03/15  5631 History   First MD Initiated Contact with Patient 04/03/15 [phone]     Chief Complaint  Patient presents with  . Chest Pain    HPI   81 year old patient presents today with epigastric "burning". Patient reports he has a history of acid reflux that has been present for years. He reports that it is worse when he lays down at night and often times asymptomatic during the day. Worse with certain foods. Patient states that he's been seen multiple times for this in both the emergency room and with the primary care provider patient reports he takes Prilosec but this has little effect on the nighttime symptoms. The only relief that he finds this with a "GI cocktail" that lasts for sometimes up to 2 months. Patient reports the sensation is burning that starts in his epigastric area in radiates up into his throat and mouth with a "sour taste". He denies radiation of symptoms, fever, shortness of breath, chest pain, nausea, diaphoresis. Does have a Ace maker for which she was seen by his cardiologist in January 2015 with normal evaluation and diagnostic testing. Os visit was to his primary care in October 2015 for some of her symptoms.   Past Medical History  Diagnosis Date  . Hypertension   . CHF (congestive heart failure)   . GERD (gastroesophageal reflux disease)   . Recurrent upper respiratory infection (URI)     recent cold- treating /w OTC  . Arthritis     "legs are stiff" , had spinal injections for pain relieve 3-4 yrs. ago  . Atrial fibrillation-permanent   . Constipation   . Depression   . Nonischemic/ ischemic cardiomyopathy     Single-vessel coronary disease catheterization 2004-Myoview showing inferior wall perfusion defect Myoview 2006  . Subdural hematoma     Prior evacuation  . Cataract   . Bradycardia   . Diverticulosis   . AAA (abdominal aortic aneurysm) without rupture     10/2012 CT scan: 4.3x4.6cm   Past Surgical History   Procedure Laterality Date  . Evacuation of subdural hematoma    . Eye surgery      cataract extracted in L eye  . Hernia repair      R side inguinal repair   . Brain surgery      2002  . Tonsillectomy      1957  . Cataract extraction w/phaco  01/30/2012    Procedure: CATARACT EXTRACTION PHACO AND INTRAOCULAR LENS PLACEMENT (IOC);  Surgeon: Adonis Brook, MD;  Location: [facility];  Service: Ophthalmology;  Laterality: Right;  . Pars plana vitrectomy  07/30/2012    Procedure: PARS PLANA VITRECTOMY WITH 23 GAUGE;  Surgeon: Adonis Brook, MD;  Location: [facility];  Service: Ophthalmology;  Laterality: Right;  Insertion of Silicone Oil  . Gas/fluid exchange  07/30/2012    Procedure: GAS/FLUID EXCHANGE;  Surgeon: Adonis Brook, MD;  Location: [facility];  Service: Ophthalmology;  Laterality: Right;  . Pacemaker insertion  08/26/12    SJM Accent SR RF pacemaker  . Permanent pacemaker insertion N/A 08/26/2012    Procedure: PERMANENT PACEMAKER INSERTION;  Surgeon: Thompson Grayer, MD;  [facility];  Service: Cardiovascular;  Laterality: N/A;   Family History  Problem Relation Age of Onset  . Anesthesia problems Neg Hx   . Hypotension Neg Hx   . Malignant hyperthermia Neg Hx   . Pseudochol deficiency Neg Hx    History  Substance  Use Topics  . Smoking status: Former Smoker -- 0.25 packs/day for 15 years    Quit date: 12/24/1994  . Smokeless tobacco: Never Used  . Alcohol Use: No    Review of Systems  All other systems reviewed and are negative.  Allergies  Review of patient's allergies indicates no known allergies.  Home Medications   Prior to Admission medications   Medication Sig Start Date End Date Taking? Authorizing Provider  aspirin EC 81 MG tablet Take 81 mg by mouth daily.    Historical Provider, MD  capsaicin (ZOSTRIX) 0.025 % cream APPLY TOPICALLY 2 TIMES A DAY FOR 7 DAYS 02/02/15   Leone Haven, MD  carvedilol (COREG) 3.125 MG tablet Take 1 tablet (3.125 mg total) by mouth 2 (two)  times daily with a meal. 12/10/13   Charolette Forward, MD  docusate sodium (COLACE) 100 MG capsule Take 1 capsule (100 mg total) by mouth daily as needed for mild constipation. 08/11/14   Leone Haven, MD  Multiple Vitamin (MULTIVITAMIN WITH MINERALS) TABS Take 1 tablet by mouth daily.    Historical Provider, MD  pantoprazole (PROTONIX) 40 MG tablet TAKE 1 TABLET BY MOUTH EVERY DAY 01/13/15   Leone Haven, MD  Probiotic Product (PROBIOTIC DAILY PO) Take 1 capsule by mouth daily.    Historical Provider, MD  Pseudoeph-Doxylamine-DM-APAP (NYQUIL PO) Take 30 mLs by mouth at bedtime as needed (sleep).    Historical Provider, MD  ramipril (ALTACE) 1.25 MG capsule Take 1 capsule (1.25 mg total) by mouth daily. 12/10/13   Charolette Forward, MD   BP 113/71 mmHg  Pulse 70  Temp(Src) 97.7 F (36.5 C) (Oral)  Resp 17  Ht 6\' 1"  (1.854 m)  Wt 160 lb (72.576 kg)  BMI 21.11 kg/m2  SpO2 98% Physical Exam  Constitutional: He is oriented to person, place, and time. He appears well-developed and well-nourished.  HENT:  Head: Normocephalic and atraumatic.  Eyes: Pupils are equal, round, and reactive to light.  Neck: Normal range of motion. Neck supple. No JVD present. No tracheal deviation present. No thyromegaly present.  Cardiovascular: Normal rate, regular rhythm, normal heart sounds and intact distal pulses.  Exam reveals no gallop and no friction rub.   No murmur heard. Pulmonary/Chest: Effort normal and breath sounds normal. No stridor. No respiratory distress. He has no wheezes. He has no rales. He exhibits no tenderness.  Abdominal: Soft. Bowel sounds are normal. He exhibits no distension and no mass. There is no tenderness. There is no rebound and no guarding.  Musculoskeletal: Normal range of motion.  Lymphadenopathy:    He has no cervical adenopathy.  Neurological: He is alert and oriented to person, place, and time. Coordination normal.  Skin: Skin is warm and dry.  Psychiatric: He has a  normal mood and affect. His behavior is normal. Judgment and thought content normal.  Nursing note and vitals reviewed.   ED Course  Procedures (including critical care time) Labs Review Labs Reviewed - No data to display  Imaging Review No results found.   EKG Interpretation   Date/Time:  Sunday April 03 2015 09:28:15 EDT Ventricular Rate:  63 PR Interval:    QRS Duration: 122 QT Interval:  410 QTC Calculation: 439 R Axis:   -61 Text Interpretation:  Suspect unspecified pacemaker failure Atrial  fibrillation with occasional ventricular-paced complexes Left anterior  fascicular block Septal infarct , age undetermined Abnormal ECG Atrial  fibrillation ventricular-paced complexes Artifact Premature ventricular  complexes Abnormal ekg No significant  change since last tracing Confirmed  by Carmin Muskrat  MD [phone]) on 04/03/2015 9:57:36 AM     MDM   Final diagnoses:  Gastroesophageal reflux disease, esophagitis presence not specified    Labs: Troponin, CBC  Imaging: None  Consults: None  Therapeutics: GI cocktail  Assessment/Plan: Patient seen to be improved with "GI ""cocktail. No red flags, chronic condition. No cardiorespiratory symptoms today. No need for further evaluation at this point. Patient is instructed to follow-up with his primary care provider informed them of his visit today and all relevant information. Patient is instructed to monitor for worsening signs or symptoms and seek care if any present. Patient understood and agreed with plan.      Okey Regal, PA-C 04/03/15 [place], MD 04/03/15 [phone]

## 2015-04-03 NOTE — Discharge Instructions (Signed)
Please intact your primary care provider informed him of her visit today. Avoid aggravating foods, or eating close to bedtime. For worsening signs or symptoms. Please continue using previously prescribed medication.

## 2015-04-07 ENCOUNTER — Ambulatory Visit (INDEPENDENT_AMBULATORY_CARE_PROVIDER_SITE_OTHER): Payer: Medicare Other | Admitting: *Deleted

## 2015-04-07 DIAGNOSIS — R001 Bradycardia, unspecified: Secondary | ICD-10-CM

## 2015-04-07 NOTE — Progress Notes (Signed)
Remote pacemaker transmission.   

## 2015-04-10 LAB — MDC_IDC_ENUM_SESS_TYPE_REMOTE
Battery Remaining Percentage: 91 %
Battery Voltage: 2.98 V
Brady Statistic RV Percent Paced: 90 %
Implantable Pulse Generator Serial Number: 7230364
Lead Channel Impedance Value: 550 Ohm
Lead Channel Pacing Threshold Amplitude: 0.75 V
Lead Channel Pacing Threshold Pulse Width: 0.4 ms
Lead Channel Sensing Intrinsic Amplitude: 9 mV
Lead Channel Setting Pacing Amplitude: 2.5 V
Lead Channel Setting Sensing Sensitivity: 2 mV
MDC IDC MSMT BATTERY REMAINING LONGEVITY: 121 mo
MDC IDC SESS DTM: 20160415133022
MDC IDC SET LEADCHNL RV PACING PULSEWIDTH: 0.4 ms

## 2015-04-26 ENCOUNTER — Encounter: Payer: Self-pay | Admitting: Cardiology

## 2015-05-02 ENCOUNTER — Encounter: Payer: Self-pay | Admitting: Internal Medicine

## 2015-05-02 ENCOUNTER — Telehealth: Payer: Self-pay | Admitting: Family Medicine

## 2015-05-02 MED ORDER — CAPSAICIN 0.025 % EX CREA: TOPICAL_CREAM | CUTANEOUS | Status: DC

## 2015-05-02 NOTE — Telephone Encounter (Signed)
Refill sent to pharmacy.   

## 2015-05-02 NOTE — Telephone Encounter (Signed)
Refil request. Will forward to PCP for review. Yogi Arther, CMA.

## 2015-05-02 NOTE — Telephone Encounter (Signed)
[name], caregiver to [name], calling regarding needing refill on his capsaicin cream.  Please send to pharmacy

## 2015-05-06 NOTE — Telephone Encounter (Signed)
Calling back, says [facility] does not have the prescription, would like it to be resent.

## 2015-05-06 NOTE — Telephone Encounter (Signed)
Called pharmacy and they stated they did not receive med so gave verbal and advised pt's caregiver, Demetria and she verbalized understanding. Pharrah Rottman, CMA.

## 2015-05-28 ENCOUNTER — Other Ambulatory Visit: Payer: Self-pay | Admitting: Family Medicine

## 2015-06-01 ENCOUNTER — Ambulatory Visit: Payer: Medicare Other | Admitting: Family Medicine

## 2015-06-15 ENCOUNTER — Encounter: Payer: Self-pay | Admitting: Family Medicine

## 2015-06-15 ENCOUNTER — Ambulatory Visit (INDEPENDENT_AMBULATORY_CARE_PROVIDER_SITE_OTHER): Payer: Medicare Other | Admitting: Family Medicine

## 2015-06-15 VITALS — BP 111/68 | HR 60 | Temp 97.7°F | Ht 73.0 in | Wt 156.0 lb

## 2015-06-15 DIAGNOSIS — I714 Abdominal aortic aneurysm, without rupture, unspecified: Secondary | ICD-10-CM

## 2015-06-15 DIAGNOSIS — M545 Low back pain, unspecified: Secondary | ICD-10-CM

## 2015-06-15 DIAGNOSIS — K5900 Constipation, unspecified: Secondary | ICD-10-CM | POA: Diagnosis not present

## 2015-06-15 NOTE — Patient Instructions (Signed)
Nice to see you. We will obtain an Korea to evaluate your aorta again.  Please continue to monitor for changes in stool, constipation, weakness, numbness, loss of bowel or bladder function, and fever and abdominal pain. If these occur please seek medical attention.

## 2015-06-15 NOTE — Progress Notes (Signed)
Patient ID: [name] [name], male   DOB: [birth year], 81 y.o.   MRN: [id number]  [name], MD Phone: [phone]  [name] [name] is a 81 y.o. male who presents today for f/u.  Constipation: patient reports he has had issues with this for a long time. Has a BM every other day that is soft. Takes medication intermittently to help with this though does not remember the name of the medication. No straining. No abdominal pain. No blood in stool. Did have mild reflux with eating fried shrimp yesterday.  Back pain: has been giving him trouble lately. Has been a chronic issue for many years. Is achey pain in his low back. Hurts more when he gets out of bed in the morning and gets better with walking. Notes mild weakness if he stands for too long, though no focal weakness, numbness, saddle anesthesia, incontinence, or fevers. Takes aspirin for discomfort and this is beneficial. Has been doing PT intermittently with benefit.   AAA: patient notes no abdominal discomfort. Had Korea with stable aneurysm last year. Noted to have had AAA going back to 2004 on [place]. Has been noted to have mural thrombus vs plaque since 2013 with this. Has previously been on anticoagulation for a fib though apparently was taken off of this by cardiology due to concern for instability and falls. Also note that the patient has had a subdural hematoma previously.  PMH: nonsmoker.   ROS: Per HPI   Physical Exam Filed Vitals:   06/15/15 1014  BP: 111/68  Pulse: 60  Temp: 97.7 F (36.5 C)    Gen: Well NAD HEENT: PERRL,  MMM Lungs: CTABL Nl WOB Heart: RRR, no murmur appreciated Abd: soft, NT, ND, no palpable masses MSK: no midline spine tenderness, no step off, no muscular tenderness Neuro: 5/5 strength in bilateral quads, hamstrings, plantar and dorsiflexion, sensation to light touch intact in bilateral LE, normal gait, 2+ patellar reflexes Exts: Non edematous BL  LE, warm and well perfused.    Assessment/Plan: Please  see individual problem list.  [name], MD [place] PGY-3

## 2015-06-15 NOTE — Assessment & Plan Note (Signed)
No abdominal pain and no palpable abnormalities on exam. Will check US aorta to follow this. Had discussion with patient and daughter regarding whether or not they would want surgery if needed and they are undecided at this time. Patient also does not appear to be a good candidate for anticoagulation for the possible thrombus that has previously been seen given his age and prior history of subdural hematoma. Will await Korea results and call patient to discuss options once this returns.

## 2015-06-17 ENCOUNTER — Ambulatory Visit
Admission: RE | Admit: 2015-06-17 | Discharge: 2015-06-17 | Disposition: A | Discharge status: Home / Self Care | Payer: Medicare Other | Source: Ambulatory Visit | Attending: Family Medicine | Admitting: Family Medicine

## 2015-06-17 ENCOUNTER — Telehealth: Payer: Self-pay | Admitting: Family Medicine

## 2015-06-17 DIAGNOSIS — I714 Abdominal aortic aneurysm, without rupture, unspecified: Secondary | ICD-10-CM

## 2015-06-17 NOTE — Assessment & Plan Note (Addendum)
Chronic stable issue. No discomfort at this time. Neurologically intact. No red flags. Offered XR to evaluate given persistence, though patient and daughter declined this. Will continue PT and PRN medications for discomfort. Given return precautions.

## 2015-06-17 NOTE — Assessment & Plan Note (Signed)
Patient with normal BMs every other day with no straining. Discussed that this is likely his normal pattern. Advised to continue his stool softeners if these help. If he goes >3 days with no BM or strains he will let us know.

## 2015-06-17 NOTE — Telephone Encounter (Signed)
Attempted to call patient to discuss Korea of aorta. There was no answer and left message advising to call out office back. Patients aneurysm has enlarged to include new vessels so he will benefit from seeing a vascular surgeon. Will await his call back.

## 2015-06-20 ENCOUNTER — Telehealth: Payer: Self-pay | Admitting: Family Medicine

## 2015-06-20 NOTE — Telephone Encounter (Signed)
Returning PCP call, best time to call is between 8:30am and 10:30am because caregiver is there and patient will not answer phone unless she is there. Thank you, Fonda Kinder, ASA

## 2015-06-20 NOTE — Telephone Encounter (Signed)
na

## 2015-06-20 NOTE — Telephone Encounter (Signed)
Spoke with patient regarding Korea results. Advised that the aneurysm appears to involve common iliac arteries a this time and he would benefit from seeing a vascular surgeon to discuss options for treatment. He was agreeable to this. Will refer to vascular surgery. Given return precautions.

## 2015-07-11 ENCOUNTER — Ambulatory Visit (INDEPENDENT_AMBULATORY_CARE_PROVIDER_SITE_OTHER): Payer: Medicare Other | Admitting: *Deleted

## 2015-07-11 DIAGNOSIS — R001 Bradycardia, unspecified: Secondary | ICD-10-CM

## 2015-07-11 NOTE — Progress Notes (Signed)
Remote pacemaker transmission.   

## 2015-07-12 LAB — CUP PACEART REMOTE DEVICE CHECK
Battery Remaining Longevity: 130 mo
Battery Voltage: 2.96 V
Brady Statistic RV Percent Paced: 90 %
Date Time Interrogation Session: 20160718060326
Lead Channel Pacing Threshold Amplitude: 0.75 V
Lead Channel Setting Pacing Pulse Width: 0.4 ms
Lead Channel Setting Sensing Sensitivity: 2 mV
MDC IDC MSMT BATTERY REMAINING PERCENTAGE: 95.5 %
MDC IDC MSMT LEADCHNL RV IMPEDANCE VALUE: 560 Ohm
MDC IDC MSMT LEADCHNL RV PACING THRESHOLD PULSEWIDTH: 0.4 ms
MDC IDC MSMT LEADCHNL RV SENSING INTR AMPL: 8.3 mV
MDC IDC PG SERIAL: 7230364
MDC IDC SET LEADCHNL RV PACING AMPLITUDE: 2.5 V

## 2015-07-26 ENCOUNTER — Encounter: Payer: Self-pay | Admitting: Vascular Surgery

## 2015-07-28 ENCOUNTER — Ambulatory Visit (INDEPENDENT_AMBULATORY_CARE_PROVIDER_SITE_OTHER): Payer: Medicare Other | Admitting: Vascular Surgery

## 2015-07-28 ENCOUNTER — Encounter: Payer: Self-pay | Admitting: Vascular Surgery

## 2015-07-28 VITALS — BP 122/85 | HR 60 | Temp 97.7°F | Resp 14 | Ht 73.0 in | Wt 159.0 lb

## 2015-07-28 DIAGNOSIS — I714 Abdominal aortic aneurysm, without rupture, unspecified: Secondary | ICD-10-CM

## 2015-07-28 NOTE — Progress Notes (Signed)
[facility] [place] HISTORY AND PHYSICAL   History of Present Illness:  Patient is a 81 y.o. year old male who presents for evaluation of abdominal aortic aneurysm. Patient has a history of abdominal aortic aneurysm dating back at least to 2013. At that time he had a CT scan which showed the aneurysm was 4.6 cm in diameter. He has had intermittent ultrasounds since then documenting the aneurysm was 4.5 cm in 2015. Most recently he had an ultrasound which showed aneurysm was 4.9 cm in diameter on a workup for reflux. The patient has chronic back pain. He denies any new or different back pain. He denies abdominal pain. He is overall reasonably independent. He does live with a caregiver. However he does perform his own activities of daily living. He was previously on anticoagulation for atrial fibrillation but this was stopped due to instability of gait. Other medical problems include hypertension, congestive failure, reflux, arthritis, atrial fibrillation, has pacemaker. All of these problems are currently stable.  Past Medical History  Diagnosis Date  . Hypertension   . CHF (congestive heart failure)   . GERD (gastroesophageal reflux disease)   . Recurrent upper respiratory infection (URI)     recent cold- treating /w OTC  . Arthritis     "legs are stiff" , had spinal injections for pain relieve 3-4 yrs. ago  . Atrial fibrillation-permanent   . Constipation   . Depression   . Nonischemic/ ischemic cardiomyopathy     Single-vessel coronary disease catheterization 2004-Myoview showing inferior wall perfusion defect Myoview 2006  . Subdural hematoma     Prior evacuation  . Cataract   . Bradycardia   . Diverticulosis   . AAA (abdominal aortic aneurysm) without rupture     10/2012 CT scan: 4.3x4.6cm    Past Surgical History  Procedure Laterality Date  . Evacuation of subdural hematoma    . Eye surgery      cataract extracted in L eye  . Hernia repair      R side  inguinal repair   . Brain surgery      2002  . Tonsillectomy      1957  . Cataract extraction w/phaco  01/30/2012    Procedure: CATARACT EXTRACTION PHACO AND INTRAOCULAR LENS PLACEMENT (IOC);  Surgeon: Adonis Brook, MD;  Location: [facility];  Service: Ophthalmology;  Laterality: Right;  . Pars plana vitrectomy  07/30/2012    Procedure: PARS PLANA VITRECTOMY WITH 23 GAUGE;  Surgeon: Adonis Brook, MD;  Location: [facility];  Service: Ophthalmology;  Laterality: Right;  Insertion of Silicone Oil  . Gas/fluid exchange  07/30/2012    Procedure: GAS/FLUID EXCHANGE;  Surgeon: Adonis Brook, MD;  Location: [facility];  Service: Ophthalmology;  Laterality: Right;  . Pacemaker insertion  08/26/12    SJM Accent SR RF pacemaker  . Permanent pacemaker insertion N/A 08/26/2012    Procedure: PERMANENT PACEMAKER INSERTION;  Surgeon: Thompson Grayer, MD;  Location: [facility] CATH LAB;  Service: Cardiovascular;  Laterality: N/A;    Social History History  Substance Use Topics  . Smoking status: Former Smoker -- 0.25 packs/day for 15 years    Quit date: 12/24/1994  . Smokeless tobacco: Never Used  . Alcohol Use: No    Family History Family History  Problem Relation Age of Onset  . Anesthesia problems Neg Hx   . Hypotension Neg Hx   . Malignant hyperthermia Neg Hx   . Pseudochol deficiency Neg Hx     Allergies  No Known  Allergies   Current Outpatient Prescriptions  Medication Sig Dispense Refill  . aspirin EC 81 MG tablet Take 81 mg by mouth daily.    . capsaicin (ZOSTRIX) 0.025 % cream APPLY TOPICALLY 2 TIMES A DAY FOR 7 DAYS 60 g 0  . carvedilol (COREG) 3.125 MG tablet Take 1 tablet (3.125 mg total) by mouth 2 (two) times daily with a meal. 60 tablet 3  . docusate sodium (COLACE) 100 MG capsule Take 1 capsule (100 mg total) by mouth daily as needed for mild constipation. 10 capsule 0  . omeprazole (PRILOSEC) 40 MG capsule Take 40 mg by mouth daily.  5  . pantoprazole (PROTONIX) 40 MG tablet TAKE 1 TABLET BY MOUTH EVERY  DAY 30 tablet 3  . Probiotic Product (PROBIOTIC DAILY PO) Take 1 capsule by mouth daily.    . Pseudoeph-Doxylamine-DM-APAP (NYQUIL PO) Take 30 mLs by mouth at bedtime as needed (sleep).    . ramipril (ALTACE) 1.25 MG capsule Take 1 capsule (1.25 mg total) by mouth daily. 30 capsule 3   No current facility-administered medications for this visit.    ROS:   General:  No weight loss, Fever, chills  HEENT: No recent headaches, no nasal bleeding, no visual changes, no sore throat  Neurologic: No dizziness, blackouts, seizures. No recent symptoms of stroke or mini- stroke. No recent episodes of slurred speech, or temporary blindness.  Cardiac: No recent episodes of chest pain/pressure, no shortness of breath at rest.  No shortness of breath with exertion.  Denies history of atrial fibrillation or irregular heartbeat  Vascular: No history of rest pain in feet.  No history of claudication.  No history of non-healing ulcer, No history of DVT   Pulmonary: No home oxygen, no productive cough, no hemoptysis,  No asthma or wheezing  Musculoskeletal:  [ ]  Arthritis, [ ]  Low back pain,  [ ]  Joint pain  Hematologic:No history of hypercoagulable state.  No history of easy bleeding.  No history of anemia  Gastrointestinal: No hematochezia or melena,  No gastroesophageal reflux, no trouble swallowing  Urinary: [ ]  chronic Kidney disease, [ ]  on HD - [ ]  MWF or [ ]  TTHS, [ ]  Burning with urination, [ ]  Frequent urination, [ ]  Difficulty urinating;   Skin: No rashes  Psychological: No history of anxiety,  No history of depression   Physical Examination  Filed Vitals:   07/28/15 1023  BP: 122/85  Pulse: 60  Temp: 97.7 F (36.5 C)  TempSrc: Oral  Resp: 14  Height: 6\' 1"  (1.854 m)  Weight: 159 lb (72.122 kg)  SpO2: 97%    Body mass index is 20.98 kg/(m^2).  General:  Alert and oriented, no acute distress HEENT: Normal Neck: No bruit or JVD Pulmonary: Clear to auscultation  bilaterally Cardiac: Regular Rate and Rhythm without murmur Abdomen: Soft, non-tender, non-distended, pulsatile mass in the upper left epigastrium  Skin: No rash Extremity Pulses:  2+ radial, brachial, femoral, absent dorsalis pedis, posterior tibial pulses bilaterally Musculoskeletal: No deformity or edema  Neurologic: Upper and lower extremity motor 5/5 and symmetric  DATA:  Ultrasound of the abdomen is reviewed which shows a 4.9 cm abdominal aortic aneurysm with slightly dilated common iliac arteries.   ASSESSMENT:  Patient with abdominal aortic aneurysm near the size of consideration for repair.   PLAN:  CT Angio abdomen and pelvis and follow-up appointment after this to determine whether or not  the aneurysm would need repair or continued follow-up surveillance.  Patient and his  family member today were counseled to make sure that any emergency room visit it is discussed that he has an abdominal aortic aneurysm  Ruta Hinds, MD [facility] [place]: [phone] Pager: [phone]

## 2015-07-29 ENCOUNTER — Encounter: Payer: Self-pay | Admitting: Cardiology

## 2015-07-29 ENCOUNTER — Encounter: Payer: Self-pay | Admitting: Internal Medicine

## 2015-07-29 NOTE — Addendum Note (Signed)
Addended by: Dorthula Rue L on: 07/29/2015 11:36 AM   Modules accepted: Orders

## 2015-08-01 NOTE — Addendum Note (Signed)
Addended by: Dorthula Rue L on: 08/01/2015 09:30 AM   Modules accepted: Orders

## 2015-08-05 ENCOUNTER — Other Ambulatory Visit: Payer: Self-pay | Admitting: Vascular Surgery

## 2015-08-05 LAB — CREATININE, SERUM: Creat: 1.46 mg/dL — ABNORMAL HIGH (ref 0.70–1.11)

## 2015-08-18 ENCOUNTER — Ambulatory Visit
Admission: RE | Admit: 2015-08-18 | Discharge: 2015-08-18 | Disposition: A | Discharge status: Home / Self Care | Payer: Medicare Other | Source: Ambulatory Visit | Attending: Vascular Surgery | Admitting: Vascular Surgery

## 2015-08-18 DIAGNOSIS — I714 Abdominal aortic aneurysm, without rupture, unspecified: Secondary | ICD-10-CM

## 2015-08-18 MED ORDER — IOPAMIDOL (ISOVUE-370) INJECTION 76%
75.0000 mL | Freq: Once | INTRAVENOUS | Status: AC | PRN
  Administered 2015-08-18: 75 mL via INTRAVENOUS

## 2015-08-24 ENCOUNTER — Encounter: Payer: Self-pay | Admitting: Vascular Surgery

## 2015-08-25 ENCOUNTER — Ambulatory Visit (INDEPENDENT_AMBULATORY_CARE_PROVIDER_SITE_OTHER): Payer: Medicare Other | Admitting: Vascular Surgery

## 2015-08-25 ENCOUNTER — Other Ambulatory Visit: Payer: Self-pay | Admitting: *Deleted

## 2015-08-25 ENCOUNTER — Encounter: Payer: Self-pay | Admitting: Vascular Surgery

## 2015-08-25 VITALS — BP 127/78 | HR 78 | Temp 98.5°F | Resp 18 | Ht 73.0 in | Wt 160.5 lb

## 2015-08-25 DIAGNOSIS — I714 Abdominal aortic aneurysm, without rupture, unspecified: Secondary | ICD-10-CM

## 2015-08-25 DIAGNOSIS — M549 Dorsalgia, unspecified: Secondary | ICD-10-CM

## 2015-08-25 DIAGNOSIS — G8929 Other chronic pain: Secondary | ICD-10-CM

## 2015-08-25 NOTE — Addendum Note (Signed)
Addended by: Dorthula Rue L on: 08/25/2015 01:44 PM   Modules accepted: Orders

## 2015-08-25 NOTE — Addendum Note (Signed)
Addended by: Dorthula Rue L on: 08/25/2015 01:51 PM   Modules accepted: Orders

## 2015-08-25 NOTE — Progress Notes (Signed)
[facility] [place]     History of Present Illness:  Patient is a 81 y.o. year old male who presents for evaluation of abdominal aortic aneurysm. Patient has a history of abdominal aortic aneurysm dating back at least to 2013. At that time he had a CT scan which showed the aneurysm was 4.3 cm in diameter. He has had intermittent ultrasounds since then documenting the aneurysm was 4.5 cm in 2015. Most recently he had an ultrasound which showed aneurysm was 4.9 cm in diameter on a workup for reflux. The patient has chronic back pain. He denies any new or different back pain. He denies abdominal pain. He is overall reasonably independent. He does live with a caregiver. However he does perform his own activities of daily living. He was previously on anticoagulation for atrial fibrillation but this was stopped due to instability of gait. Other medical problems include hypertension, congestive failure, reflux, arthritis, atrial fibrillation, has pacemaker. All of these problems are currently stable. He returns today for follow-up after CT angiogram. He has had no change in his symptoms.    ROS:    Cardiac: No recent episodes of chest pain/pressure, no shortness of breath at rest.  + shortness of breath with exertion. + history of atrial fibrillation or irregular heartbeat  Pulmonary: No home oxygen, no productive cough, no hemoptysis,  No asthma or wheezing  Musculoskeletal:  [ ]  Arthritis, [x ] Low back pain,  [ ]  Joint pain   Physical Examination    Filed Vitals:   08/25/15 0917  BP: 127/78  Pulse: 78  Temp: 98.5 F (36.9 C)  TempSrc: Oral  Resp: 18  Height: 6\' 1"  (1.854 m)  Weight: 160 lb 8 oz (72.802 kg)  SpO2: 99%    General:  Alert and oriented, no acute distress  DATA:  CT angiogram of the abdomen and pelvis images are reviewed. On my measurement the aneurysm is currently 4.6 x 4.5 cm with a 2 cm left common iliac artery and a 16 mm right common iliac  artery   ASSESSMENT:  Patient with abdominal aortic aneurysm currently 4.6 cm in diameter. This represents a 3 mm growth over 3 years. The patient also has chronic back pain. I do not believe this is related to his aneurysm. There was no evidence of rupture of the aneurysm on the CT scan.  PLAN:  I had a discussion today with the patient and his daughter regarding aneurysm size growth and risk of rupture. I discussed with her that the risk of rupture of aneurysm less than 5-1/2 cm in diameter is less than 0.5%. We also discussed the possibility of aneurysms can grow over time and risk of increased aneurysm growth with poorly controlled hypertension or chronic coughing with COPD. We also discussed risk versus benefit of aneurysm repair especially in a 81 year old. The patient has requested additional treatment for his chronic back pain. I referred him to advance home for continued physical therapy. He will follow-up with me in 6 months time with repeat ultrasound. I again emphasized to the patient and his family that if he has worsening back pain or increased abdominal pain he should let the emergency room physician noted that he has an abdominal aortic aneurysm.  Ruta Hinds, MD [facility] [place] Office: [phone] Pager: [phone]

## 2015-09-15 ENCOUNTER — Telehealth: Payer: Self-pay | Admitting: Family Medicine

## 2015-09-15 NOTE — Telephone Encounter (Signed)
Will forward to MD to advise. Jazmin Hartsell,CMA  

## 2015-09-15 NOTE — Telephone Encounter (Signed)
Tracey the home health nurse for the patient is calling because the patient id having a issue with constipation. He is taking Mik of Magnesium, but she thinks he is taking to much so its not helping. She was wondering if the doctor wanted to prescribe something or have the patient take OTC Miralax. Please call her to discuss. jw

## 2015-09-16 NOTE — Telephone Encounter (Signed)
Unable to call this morning. Please call her back and tell her over the counter miralax is fine. Thanks  CGM MD

## 2015-09-16 NOTE — Telephone Encounter (Signed)
LM for [name] [name] on confidential VM.  Jazmin Hartsell,CMA

## 2015-10-10 ENCOUNTER — Ambulatory Visit (INDEPENDENT_AMBULATORY_CARE_PROVIDER_SITE_OTHER): Payer: Medicare Other | Admitting: *Deleted

## 2015-10-10 DIAGNOSIS — I482 Chronic atrial fibrillation, unspecified: Secondary | ICD-10-CM

## 2015-10-10 LAB — CUP PACEART REMOTE DEVICE CHECK
Battery Voltage: 2.96 V
Brady Statistic RV Percent Paced: 90 %
Implantable Lead Implant Date: 20130903
Implantable Lead Model: 1948
Lead Channel Impedance Value: 560 Ohm
Lead Channel Pacing Threshold Amplitude: 0.75 V
Lead Channel Pacing Threshold Pulse Width: 0.4 ms
Lead Channel Sensing Intrinsic Amplitude: 9.2 mV
Lead Channel Setting Pacing Pulse Width: 0.4 ms
Lead Channel Setting Sensing Sensitivity: 2 mV
MDC IDC LEAD LOCATION: 753860
MDC IDC MSMT BATTERY REMAINING LONGEVITY: 129 mo
MDC IDC MSMT BATTERY REMAINING PERCENTAGE: 95.5 %
MDC IDC SESS DTM: 20161017113032
MDC IDC SET LEADCHNL RV PACING AMPLITUDE: 2.5 V
Pulse Gen Model: 1210
Pulse Gen Serial Number: 7230364

## 2015-10-10 NOTE — Progress Notes (Signed)
Remote pacemaker transmission.   

## 2015-10-26 ENCOUNTER — Encounter: Payer: Self-pay | Admitting: Cardiology

## 2015-10-31 ENCOUNTER — Other Ambulatory Visit: Payer: Self-pay | Admitting: *Deleted

## 2015-10-31 MED ORDER — PANTOPRAZOLE SODIUM 40 MG PO TBEC: 40.0000 mg | DELAYED_RELEASE_TABLET | Freq: Every day | ORAL | Status: DC

## 2015-11-20 ENCOUNTER — Emergency Department (HOSPITAL_COMMUNITY)
Admission: EM | Admit: 2015-11-20 | Discharge: 2015-11-20 | Disposition: A | Discharge status: Home / Self Care | Payer: Medicare Other | Attending: Emergency Medicine | Admitting: Emergency Medicine

## 2015-11-20 ENCOUNTER — Emergency Department (HOSPITAL_COMMUNITY): Payer: Medicare Other

## 2015-11-20 ENCOUNTER — Encounter (HOSPITAL_COMMUNITY): Payer: Self-pay

## 2015-11-20 DIAGNOSIS — I509 Heart failure, unspecified: Secondary | ICD-10-CM | POA: Diagnosis not present

## 2015-11-20 DIAGNOSIS — Z87891 Personal history of nicotine dependence: Secondary | ICD-10-CM | POA: Insufficient documentation

## 2015-11-20 DIAGNOSIS — R079 Chest pain, unspecified: Secondary | ICD-10-CM | POA: Diagnosis present

## 2015-11-20 DIAGNOSIS — Z950 Presence of cardiac pacemaker: Secondary | ICD-10-CM | POA: Diagnosis not present

## 2015-11-20 DIAGNOSIS — I10 Essential (primary) hypertension: Secondary | ICD-10-CM | POA: Diagnosis not present

## 2015-11-20 DIAGNOSIS — I4891 Unspecified atrial fibrillation: Secondary | ICD-10-CM | POA: Diagnosis not present

## 2015-11-20 DIAGNOSIS — Z7982 Long term (current) use of aspirin: Secondary | ICD-10-CM | POA: Insufficient documentation

## 2015-11-20 DIAGNOSIS — K2100 Gastro-esophageal reflux disease with esophagitis, without bleeding: Secondary | ICD-10-CM

## 2015-11-20 DIAGNOSIS — M1990 Unspecified osteoarthritis, unspecified site: Secondary | ICD-10-CM | POA: Insufficient documentation

## 2015-11-20 DIAGNOSIS — Z9841 Cataract extraction status, right eye: Secondary | ICD-10-CM | POA: Diagnosis not present

## 2015-11-20 DIAGNOSIS — Z8659 Personal history of other mental and behavioral disorders: Secondary | ICD-10-CM | POA: Insufficient documentation

## 2015-11-20 DIAGNOSIS — Z79899 Other long term (current) drug therapy: Secondary | ICD-10-CM | POA: Insufficient documentation

## 2015-11-20 LAB — BASIC METABOLIC PANEL
Anion gap: 9 (ref 5–15)
BUN: 15 mg/dL (ref 6–20)
CALCIUM: 9 mg/dL (ref 8.9–10.3)
CO2: 20 mmol/L — AB (ref 22–32)
CREATININE: 1.46 mg/dL — AB (ref 0.61–1.24)
Chloride: 109 mmol/L (ref 101–111)
GFR calc Af Amer: 47 mL/min — ABNORMAL LOW (ref >60)
GFR, EST NON AFRICAN AMERICAN: 41 mL/min — AB (ref >60)
GLUCOSE: 83 mg/dL (ref 65–99)
Potassium: 4.6 mmol/L (ref 3.5–5.1)
Sodium: 138 mmol/L (ref 135–145)

## 2015-11-20 LAB — CBC
HCT: 43.2 % (ref 39.0–52.0)
HEMOGLOBIN: 14.3 g/dL (ref 13.0–17.0)
MCH: 25.3 pg — AB (ref 26.0–34.0)
MCHC: 33.1 g/dL (ref 30.0–36.0)
MCV: 76.3 fL — ABNORMAL LOW (ref 78.0–100.0)
PLATELETS: 150 10*3/uL (ref 150–400)
RBC: 5.66 MIL/uL (ref 4.22–5.81)
RDW: 17.2 % — AB (ref 11.5–15.5)
WBC: 3 10*3/uL — ABNORMAL LOW (ref 4.0–10.5)

## 2015-11-20 LAB — I-STAT TROPONIN, ED: TROPONIN I, POC: 0.02 ng/mL (ref 0.00–0.08)

## 2015-11-20 MED ORDER — GI COCKTAIL ~~LOC~~
30.0000 mL | Freq: Once | ORAL | Status: AC
  Administered 2015-11-20: 30 mL via ORAL
  Filled 2015-11-20: qty 30

## 2015-11-20 NOTE — Discharge Instructions (Signed)
Esophagitis °Esophagitis is inflammation of the esophagus. The esophagus is the tube that carries food and liquids from your mouth to your stomach. Esophagitis can cause soreness or pain in the esophagus. This condition can make it difficult and painful to swallow.  °CAUSES °Most causes of esophagitis are not serious. Common causes of this condition include: °· Gastroesophageal reflux disease (GERD). This is when stomach contents move back up into the esophagus (reflux). °· Repeated vomiting. °· An allergic-type reaction, especially caused by food allergies (eosinophilic esophagitis). °· Injury to the esophagus by swallowing large pills with or without water, or swallowing certain types of medicines. °· Swallowing (ingesting) harmful chemicals, such as household cleaning products. °· Heavy alcohol use. °· An infection of the esophagus. This most often occurs in people who have a weakened immune system. °· Radiation or chemotherapy treatment for cancer. °· Certain diseases such as sarcoidosis, Crohn disease, and scleroderma. °SYMPTOMS °Symptoms of this condition include: °· Difficult or painful swallowing. °· Pain with swallowing acidic liquids, such as citrus juices. °· Pain with burping. °· Chest pain. °· Difficulty breathing. °· Nausea. °· Vomiting. °· Pain in the abdomen. °· Weight loss. °· Ulcers in the mouth. °· Patches of white material in the mouth (candidiasis). °· Fever. °· Coughing up blood or vomiting blood. °· Stool that is black, tarry, or bright red. °DIAGNOSIS °Your health care provider will take a medical history and perform a physical exam. You may also have other tests, including: °· An endoscopy to examine your stomach and esophagus with a small camera. °· A test that measures the acidity level in your esophagus. °· A test that measures how much pressure is on your esophagus. °· A barium swallow or modified barium swallow to show the shape, size, and functioning of your esophagus. °· Allergy  tests. °TREATMENT °Treatment for this condition depends on the cause of your esophagitis. In some cases, steroids or other medicines may be given to help relieve your symptoms or to treat the underlying cause of your condition. You may have to make some lifestyle changes, such as: °· Avoiding alcohol. °· Quitting smoking. °· Changing your diet. °· Exercising. °· Changing your sleep habits and your sleep environment. °HOME CARE INSTRUCTIONS °Take these actions to decrease your discomfort and to help avoid complications. °Diet °· Follow a diet as recommended by your health care provider. This may involve avoiding foods and drinks such as: °¨ Coffee and tea (with or without caffeine). °¨ Drinks that contain alcohol. °¨ Energy drinks and sports drinks. °¨ Carbonated drinks or sodas. °¨ Chocolate and cocoa. °¨ Peppermint and mint flavorings. °¨ Garlic and onions. °¨ Horseradish. °¨ Spicy and acidic foods, including peppers, chili powder, curry powder, vinegar, hot sauces, and barbecue sauce. °¨ Citrus fruit juices and citrus fruits, such as oranges, lemons, and limes. °¨ Tomato-based foods, such as red sauce, chili, salsa, and pizza with red sauce. °¨ Fried and fatty foods, such as donuts, french fries, potato chips, and high-fat dressings. °¨ High-fat meats, such as hot dogs and fatty cuts of red and white meats, such as rib eye steak, sausage, ham, and bacon. °¨ High-fat dairy items, such as whole milk, butter, and cream cheese. °· Eat small, frequent meals instead of large meals. °· Avoid drinking large amounts of liquid with your meals. °· Avoid eating meals during the 2-3 hours before bedtime. °· Avoid lying down right after you eat. °· Do not exercise right after you eat. °· Avoid foods and drinks that seem to   make your symptoms worse. °General Instructions °· Pay attention to any changes in your symptoms. °· Take over-the-counter and prescription medicines only as told by your health care provider. Do not take  aspirin, ibuprofen, or other NSAIDs unless your health care provider told you to do so. °· If you have trouble taking pills, use a pill splitter to decrease the size of the pill. This will decrease the chance of the pill getting stuck or injuring your esophagus on the way down. Also, drink water after you take a pill. °· Do not use any tobacco products, including cigarettes, chewing tobacco, and e-cigarettes. If you need help quitting, ask your health care provider. °· Wear loose-fitting clothing. Do not wear anything tight around your waist that causes pressure on your abdomen. °· Raise (elevate) the head of your bed about 6 inches (15 cm). °· Try to reduce your stress, such as with yoga or meditation. If you need help reducing stress, ask your health care provider. °· If you are overweight, reduce your weight to an amount that is healthy for you. Ask your health care provider for guidance about a safe weight loss goal. °· Keep all follow-up visits as told by your health care provider. This is important. °SEEK MEDICAL CARE IF: °· You have new symptoms. °· You have unexplained weight loss. °· You have difficulty swallowing, or it hurts to swallow. °· You have wheezing or a persistent cough. °· Your symptoms do not improve with treatment. °· You have frequent heartburn for more than two weeks. °SEEK IMMEDIATE MEDICAL CARE IF: °· You have severe pain in your arms, neck, jaw, teeth, or back. °· You feel sweaty, dizzy, or light-headed. °· You have chest pain or shortness of breath. °· You vomit and your vomit looks like blood or coffee grounds. °· Your stool is bloody or black. °· You have a fever. °· You cannot swallow, drink, or eat. °  °This information is not intended to replace advice given to you by your health care provider. Make sure you discuss any questions you have with your health care provider. °  °Document Released: 01/17/2005 Document Revised: 08/31/2015 Document Reviewed: 04/06/2015 °Elsevier Interactive  Patient Education ©2016 [employer] ° °

## 2015-11-20 NOTE — ED Notes (Signed)
Per pt he started having "reflux" a couple of days ago after drinking some lemonade. Pt states that the pain has gradually gotten worse over time. Pt rates pain 6/10. Pt states that he has had pain like this in the past and sometimes he has had to be admitted.

## 2015-11-20 NOTE — ED Notes (Signed)
Patient transported to X-ray 

## 2015-11-20 NOTE — ED Provider Notes (Signed)
CSN: [id number]     Arrival date & time 11/20/15  [id number] History   First MD Initiated Contact with Patient 11/20/15 0701     Chief Complaint  Patient presents with  . Chest Pain   (Consider location/radiation/quality/duration/timing/severity/associated sxs/prior Treatment) HPI  Patient is a 81 year old man with history of GERD, afib, CHF, HTN who presents with 3-4 days of intermittent chest pain. Patient describes the pain as a burning sensation just below his sternum that is consistent with his prior episodes of GERD. Patient takes protonix daily for his GERD, but states that it does not work very well. Patient also reports trying milk of magnesia which did not help. Reports that GI cocktails have helped in the past. Pain sometimes radiates up his chest into the back of his throat. He describes a "sour" taste in his mouth when this happens. Pain only occurs after eating or lying flat. Pain is improved with movement and exertion. No shortness of breath. No fevers or chills. No nausea or vomiting.   Past Medical History  Diagnosis Date  . Hypertension   . CHF (congestive heart failure) ([place])   . GERD (gastroesophageal reflux disease)   . Recurrent upper respiratory infection (URI)     recent cold- treating /w OTC  . Arthritis     "legs are stiff" , had spinal injections for pain relieve 3-4 yrs. ago  . Atrial fibrillation-permanent   . Constipation   . Depression   . Nonischemic/ ischemic cardiomyopathy     Single-vessel coronary disease catheterization 2004-Myoview showing inferior wall perfusion defect Myoview 2006  . Subdural hematoma ([facility])     Prior evacuation  . Cataract   . Bradycardia   . Diverticulosis   . AAA (abdominal aortic aneurysm) without rupture ([place])     10/2012 CT scan: 4.3x4.6cm   Past Surgical History  Procedure Laterality Date  . Evacuation of subdural hematoma    . Eye surgery      cataract extracted in L eye  . Hernia repair      R side inguinal repair    . Brain surgery      2002  . Tonsillectomy      1957  . Cataract extraction w/phaco  01/30/2012    Procedure: CATARACT EXTRACTION PHACO AND INTRAOCULAR LENS PLACEMENT (IOC);  Surgeon: Adonis Brook, MD;  Location: [facility];  Service: Ophthalmology;  Laterality: Right;  . Pars plana vitrectomy  07/30/2012    Procedure: PARS PLANA VITRECTOMY WITH 23 GAUGE;  Surgeon: Adonis Brook, MD;  Location: [facility];  Service: Ophthalmology;  Laterality: Right;  Insertion of Silicone Oil  . Gas/fluid exchange  07/30/2012    Procedure: GAS/FLUID EXCHANGE;  Surgeon: Adonis Brook, MD;  Location: [facility];  Service: Ophthalmology;  Laterality: Right;  . Pacemaker insertion  08/26/12    SJM Accent SR RF pacemaker  . Permanent pacemaker insertion N/A 08/26/2012    Procedure: PERMANENT PACEMAKER INSERTION;  Surgeon: Thompson Grayer, MD;  Location: [facility] CATH LAB;  Service: Cardiovascular;  Laterality: N/A;   Family History  Problem Relation Age of Onset  . Anesthesia problems Neg Hx   . Hypotension Neg Hx   . Malignant hyperthermia Neg Hx   . Pseudochol deficiency Neg Hx    Social History  Substance Use Topics  . Smoking status: Former Smoker -- 0.25 packs/day for 15 years    Quit date: 12/24/1994  . Smokeless tobacco: Never Used  . Alcohol Use: No    Review of Systems  Constitutional: Negative for fever and chills.  HENT: Negative.   Eyes: Negative.   Respiratory: Negative for shortness of breath.   Cardiovascular: Positive for chest pain.  Gastrointestinal: Negative for nausea, vomiting and diarrhea.  Endocrine: Negative.   Genitourinary: Negative.   Musculoskeletal: Negative.   Skin: Negative.   Allergic/Immunologic: Negative.   Neurological: Negative.   Hematological: Negative.       Allergies  Review of patient's allergies indicates no known allergies.  Home Medications   Prior to Admission medications   Medication Sig Start Date End Date Taking? Authorizing Provider  aspirin EC 81 MG tablet Take 81 mg  by mouth daily.    Historical Provider, MD  capsaicin (ZOSTRIX) 0.025 % cream APPLY TOPICALLY 2 TIMES A DAY FOR 7 DAYS 05/02/15   Leone Haven, MD  carvedilol (COREG) 3.125 MG tablet Take 1 tablet (3.125 mg total) by mouth 2 (two) times daily with a meal. 12/10/13   Charolette Forward, MD  docusate sodium (COLACE) 100 MG capsule Take 1 capsule (100 mg total) by mouth daily as needed for mild constipation. 08/11/14   Leone Haven, MD  omeprazole (PRILOSEC) 40 MG capsule Take 40 mg by mouth daily. 03/30/15   Historical Provider, MD  pantoprazole (PROTONIX) 40 MG tablet Take 1 tablet (40 mg total) by mouth daily. 10/31/15   Aquilla Hacker, MD  Probiotic Product (PROBIOTIC DAILY PO) Take 1 capsule by mouth daily.    Historical Provider, MD  Pseudoeph-Doxylamine-DM-APAP (NYQUIL PO) Take 30 mLs by mouth at bedtime as needed (sleep).    Historical Provider, MD  ramipril (ALTACE) 1.25 MG capsule Take 1 capsule (1.25 mg total) by mouth daily. 12/10/13   Charolette Forward, MD   BP 103/59 mmHg  Pulse 59  Temp(Src) 98.4 F (36.9 C) (Oral)  Resp 11  SpO2 98% Physical Exam  Constitutional: He is oriented to person, place, and time. He appears well-developed and well-nourished. No distress.  HENT:  Head: Normocephalic and atraumatic.  Eyes: EOM are normal. Pupils are equal, round, and reactive to light.  Neck: Normal range of motion. Neck supple.  Cardiovascular: Normal rate, regular rhythm and normal heart sounds.   No murmur heard. Pulmonary/Chest: Effort normal and breath sounds normal. No respiratory distress.  Abdominal: Soft. Bowel sounds are normal. He exhibits no distension. There is no tenderness.  Musculoskeletal: Normal range of motion. He exhibits no tenderness.  Neurological: He is alert and oriented to person, place, and time. No cranial nerve deficit.  Skin: Skin is warm and dry. No erythema.  Psychiatric: He has a normal mood and affect. His behavior is normal.  Nursing note and vitals  reviewed.   ED Course  Procedures (including critical care time) Labs Review Labs Reviewed  BASIC METABOLIC PANEL - Abnormal; Notable for the following:    CO2 20 (*)    Creatinine, Ser 1.46 (*)    GFR calc non Af Amer 41 (*)    GFR calc Af Amer 47 (*)    All other components within normal limits  CBC - Abnormal; Notable for the following:    WBC 3.0 (*)    MCV 76.3 (*)    MCH 25.3 (*)    RDW 17.2 (*)    All other components within normal limits  I-STAT TROPOININ, ED    Imaging Review Dg Chest 2 View  11/20/2015  CLINICAL DATA:  81 year old male with chest pain and symptoms of reflux EXAM: CHEST  2 VIEW COMPARISON:  Prior chest  x-ray 09/21/2014 FINDINGS: Stable cardiomegaly. Left subclavian approach cardiac rhythm maintenance device with lead projecting over the right ventricle in unchanged position. Atherosclerotic calcification present in the transverse aorta. The lungs are clear. Chronic elevation of the right hemidiaphragm. No pneumothorax or pleural effusion. Multilevel degenerative osteophyte formation along the visualized thoracic spine. No acute osseous abnormality. Degenerative osteoarthritis in both acromioclavicular joints. IMPRESSION: 1. Stable cardiomegaly and aortic atherosclerosis without evidence of acute cardiopulmonary process. Electronically Signed   By: Jacqulynn Cadet M.D.   On: 11/20/2015 08:17   I have personally reviewed and evaluated these images and lab results as part of my medical decision-making.   EKG Interpretation   Date/Time:  Sunday November 20 2015 07:01:29 EST Ventricular Rate:  62 PR Interval:    QRS Duration: 177 QT Interval:  475 QTC Calculation: 482 R Axis:   -84 Text Interpretation:  Atrial fibrillation IVCD, consider atypical RBBB  Left ventricular hypertrophy Anterolateral infarct, old Abnrm T, probable  ischemia, anterolateral lds Atrial fibrillation Premature ventricular  complexes Non-specific intra-ventricular conduction delay  T wave  abnormality Abnormal ekg Confirmed by Carmin Muskrat  MD [phone]) on  11/20/2015 7:43:49 AM      MDM   Final diagnoses:  Reflux esophagitis  Gastroesophageal reflux disease with esophagitis   Patient is a 81 year old man with history of GERD, afib, CHF, HTN who presenting with 3-4 days of intermittent chest pain. Physical exam benign. CBC, EKG, CXR, and troponin negative. Pain resolved in ED with GI cocktail. Presentation likely due to chronic GERD and reflux esophagitis. Doubt cardiac etiology given negative cardiac work up and improvement with GI cocktail. Will discharge home home. Instructed patient to follow up with PCP for ongoing management of GERD and reflux esophagitis. Return precautions reviewed.     Vivi Barrack, MD 11/20/15 [place], MD 11/26/15 [phone]

## 2015-12-01 ENCOUNTER — Telehealth: Payer: Self-pay | Admitting: Family Medicine

## 2015-12-01 NOTE — Telephone Encounter (Signed)
Pt is calling because he is having some indigestion, acid reflux ect. The patient would like the doctor to call in something for this, if you have any questions please call. jw

## 2015-12-01 NOTE — Telephone Encounter (Signed)
Will forward to MD. [name] [name],CMA  

## 2015-12-01 NOTE — Telephone Encounter (Signed)
Please let him know that Protonix with 3 refills was sent to his pharmacy on 10/31/2015. Thanks  CGM MD

## 2015-12-02 NOTE — Telephone Encounter (Signed)
Patient is aware of this. [name] [name],CMA  

## 2015-12-05 NOTE — Telephone Encounter (Signed)
Pt needs somethin called in for haeartburn.  [facility] does not have the [facility] called in in nov

## 2015-12-05 NOTE — Telephone Encounter (Signed)
Spoke with pharmacy and this medication is on file for patient but hasn't been filled.  "Shiny" stated that she would fill script for patient to pick up today.  LM for patient with this information. Jazmin Hartsell,CMA

## 2015-12-12 NOTE — Telephone Encounter (Signed)
Wants something else for heartburn. The protononix isnt working

## 2015-12-12 NOTE — Telephone Encounter (Signed)
Will forward to MD. Jazmin Hartsell,CMA  

## 2015-12-20 NOTE — Telephone Encounter (Signed)
Sent in alternative.   CGM MD

## 2016-01-18 ENCOUNTER — Ambulatory Visit: Payer: Medicare Other | Admitting: Internal Medicine

## 2016-01-18 ENCOUNTER — Encounter: Payer: Medicare Other | Admitting: Family Medicine

## 2016-01-23 ENCOUNTER — Encounter: Payer: Medicare Other | Admitting: Internal Medicine

## 2016-01-24 ENCOUNTER — Encounter: Payer: Self-pay | Admitting: Internal Medicine

## 2016-01-28 ENCOUNTER — Emergency Department (HOSPITAL_COMMUNITY): Payer: Medicare Other

## 2016-01-28 ENCOUNTER — Emergency Department (HOSPITAL_COMMUNITY)
Admission: EM | Admit: 2016-01-28 | Discharge: 2016-01-28 | Disposition: A | Discharge status: Home / Self Care | Payer: Medicare Other | Attending: Emergency Medicine | Admitting: Emergency Medicine

## 2016-01-28 ENCOUNTER — Encounter (HOSPITAL_COMMUNITY): Payer: Self-pay | Admitting: Emergency Medicine

## 2016-01-28 DIAGNOSIS — I509 Heart failure, unspecified: Secondary | ICD-10-CM | POA: Insufficient documentation

## 2016-01-28 DIAGNOSIS — I10 Essential (primary) hypertension: Secondary | ICD-10-CM | POA: Insufficient documentation

## 2016-01-28 DIAGNOSIS — F329 Major depressive disorder, single episode, unspecified: Secondary | ICD-10-CM | POA: Diagnosis not present

## 2016-01-28 DIAGNOSIS — R079 Chest pain, unspecified: Secondary | ICD-10-CM | POA: Diagnosis present

## 2016-01-28 DIAGNOSIS — Z7982 Long term (current) use of aspirin: Secondary | ICD-10-CM | POA: Insufficient documentation

## 2016-01-28 DIAGNOSIS — Z79899 Other long term (current) drug therapy: Secondary | ICD-10-CM | POA: Diagnosis not present

## 2016-01-28 DIAGNOSIS — Z87891 Personal history of nicotine dependence: Secondary | ICD-10-CM | POA: Insufficient documentation

## 2016-01-28 DIAGNOSIS — Z7902 Long term (current) use of antithrombotics/antiplatelets: Secondary | ICD-10-CM | POA: Insufficient documentation

## 2016-01-28 DIAGNOSIS — K2100 Gastro-esophageal reflux disease with esophagitis, without bleeding: Secondary | ICD-10-CM

## 2016-01-28 LAB — BASIC METABOLIC PANEL
Anion gap: 14 (ref 5–15)
BUN: 11 mg/dL (ref 6–20)
CHLORIDE: 105 mmol/L (ref 101–111)
CO2: 21 mmol/L — ABNORMAL LOW (ref 22–32)
CREATININE: 1.4 mg/dL — AB (ref 0.61–1.24)
Calcium: 9.4 mg/dL (ref 8.9–10.3)
GFR calc Af Amer: 49 mL/min — ABNORMAL LOW (ref >60)
GFR calc non Af Amer: 43 mL/min — ABNORMAL LOW (ref >60)
GLUCOSE: 81 mg/dL (ref 65–99)
POTASSIUM: 4.7 mmol/L (ref 3.5–5.1)
Sodium: 140 mmol/L (ref 135–145)

## 2016-01-28 LAB — CBC WITH DIFFERENTIAL/PLATELET
Basophils Absolute: 0 10*3/uL (ref 0.0–0.1)
Basophils Relative: 1 %
EOS PCT: 5 %
Eosinophils Absolute: 0.2 10*3/uL (ref 0.0–0.7)
HCT: 42.2 % (ref 39.0–52.0)
HEMOGLOBIN: 14.1 g/dL (ref 13.0–17.0)
LYMPHS ABS: 1.6 10*3/uL (ref 0.7–4.0)
LYMPHS PCT: 46 %
MCH: 25.3 pg — AB (ref 26.0–34.0)
MCHC: 33.4 g/dL (ref 30.0–36.0)
MCV: 75.8 fL — AB (ref 78.0–100.0)
MONOS PCT: 11 %
Monocytes Absolute: 0.4 10*3/uL (ref 0.1–1.0)
NEUTROS PCT: 38 %
Neutro Abs: 1.3 10*3/uL — ABNORMAL LOW (ref 1.7–7.7)
Platelets: 150 10*3/uL (ref 150–400)
RBC: 5.57 MIL/uL (ref 4.22–5.81)
RDW: 17.3 % — ABNORMAL HIGH (ref 11.5–15.5)
WBC: 3.4 10*3/uL — AB (ref 4.0–10.5)

## 2016-01-28 LAB — I-STAT TROPONIN, ED
Troponin i, poc: 0.03 ng/mL (ref 0.00–0.08)
Troponin i, poc: 0.03 ng/mL (ref 0.00–0.08)

## 2016-01-28 MED ORDER — GI COCKTAIL ~~LOC~~
30.0000 mL | Freq: Once | ORAL | Status: AC
  Administered 2016-01-28: 30 mL via ORAL
  Filled 2016-01-28: qty 30

## 2016-01-28 MED ORDER — GI COCKTAIL ~~LOC~~: 30.0000 mL | Freq: Once | ORAL | Status: DC | PRN

## 2016-01-28 NOTE — ED Notes (Signed)
Spoke with Maudie Mercury from main lab, able to add on troponin at this time.

## 2016-01-28 NOTE — ED Notes (Signed)
Pt c/o burning sensation to center of chest onset 3-4 days ago becoming progressively worse. Pt denies any other symptoms.

## 2016-01-28 NOTE — ED Notes (Signed)
Patient has returned back from being out of the department; patient placed back on monitor, continuous pulse oximetry and blood pressure cuff

## 2016-01-28 NOTE — ED Provider Notes (Signed)
CSN: [id number]     Arrival date & time 01/28/16  0709 History   First MD Initiated Contact with Patient 01/28/16 0719     Chief Complaint  Patient presents with  . Chest Pain     (Consider location/radiation/quality/duration/timing/severity/associated sxs/prior Treatment) HPI  82 year old male who presents with chest pain. History of hypertension, nonischemic cardiomyopathy, history of symptomatic bradycardia with permanent pacemaker, AAA and chronic atrial fibrillation. Reports that for the past 3-4 days he has had episodic burning in his chest. States that it radiates from the center of the bottom of his chest to the back of his throat, and is associated with eating and laying down to sleep. States that he has a history of GERD and esophagitis, and he has had multiple ED visits for the exact same symptoms and this is typical of her GERD symptoms. States that his pain fully resolved after GI cocktail.  he notes that pain is often better when he stands up and moves around, and improved with exertional activity. Denies any nausea or vomiting, abdominal pain or back pain, diaphoresis, syncope or near syncope. No recent illness.   Past Medical History  Diagnosis Date  . Hypertension   . CHF (congestive heart failure) ([place])   . GERD (gastroesophageal reflux disease)   . Recurrent upper respiratory infection (URI)     recent cold- treating /w OTC  . Arthritis     "legs are stiff" , had spinal injections for pain relieve 3-4 yrs. ago  . Atrial fibrillation-permanent   . Constipation   . Depression   . Nonischemic/ ischemic cardiomyopathy     Single-vessel coronary disease catheterization 2004-Myoview showing inferior wall perfusion defect Myoview 2006  . Subdural hematoma (HCC)     Prior evacuation  . Cataract   . Bradycardia   . Diverticulosis   . AAA (abdominal aortic aneurysm) without rupture ([place])     10/2012 CT scan: 4.3x4.6cm   Past Surgical History  Procedure Laterality Date  .  Evacuation of subdural hematoma    . Eye surgery      cataract extracted in L eye  . Hernia repair      R side inguinal repair   . Brain surgery      2002  . Tonsillectomy      1957  . Cataract extraction w/phaco  01/30/2012    Procedure: CATARACT EXTRACTION PHACO AND INTRAOCULAR LENS PLACEMENT (IOC);  Surgeon: Adonis Brook, MD;  Location: [facility];  Service: Ophthalmology;  Laterality: Right;  . Pars plana vitrectomy  07/30/2012    Procedure: PARS PLANA VITRECTOMY WITH 23 GAUGE;  Surgeon: Adonis Brook, MD;  Location: [facility];  Service: Ophthalmology;  Laterality: Right;  Insertion of Silicone Oil  . Gas/fluid exchange  07/30/2012    Procedure: GAS/FLUID EXCHANGE;  Surgeon: Adonis Brook, MD;  Location: [facility];  Service: Ophthalmology;  Laterality: Right;  . Pacemaker insertion  08/26/12    SJM Accent SR RF pacemaker  . Permanent pacemaker insertion N/A 08/26/2012    Procedure: PERMANENT PACEMAKER INSERTION;  Surgeon: Thompson Grayer, MD;  Location: [facility] CATH LAB;  Service: Cardiovascular;  Laterality: N/A;   Family History  Problem Relation Age of Onset  . Anesthesia problems Neg Hx   . Hypotension Neg Hx   . Malignant hyperthermia Neg Hx   . Pseudochol deficiency Neg Hx    Social History  Substance Use Topics  . Smoking status: Former Smoker -- 0.25 packs/day for 15 years    Quit date: 12/24/1994  .  Smokeless tobacco: Never Used  . Alcohol Use: No    Review of Systems 10/14 systems reviewed and are negative other than those stated in the HPI   Allergies  Review of patient's allergies indicates no known allergies.  Home Medications   Prior to Admission medications   Medication Sig Start Date End Date Taking? Authorizing Provider  aspirin EC 81 MG tablet Take 81 mg by mouth daily.   Yes Historical Provider, MD  carvedilol (COREG) 3.125 MG tablet Take 1 tablet (3.125 mg total) by mouth 2 (two) times daily with a meal. 12/10/13  Yes Charolette Forward, MD  clopidogrel (PLAVIX) 75 MG tablet Take  37.5 mg by mouth daily.   Yes Historical Provider, MD  docusate sodium (COLACE) 100 MG capsule Take 1 capsule (100 mg total) by mouth daily as needed for mild constipation. 08/11/14  Yes Leone Haven, MD  omeprazole (PRILOSEC) 40 MG capsule Take 40 mg by mouth daily. 03/30/15  Yes Historical Provider, MD  pantoprazole (PROTONIX) 40 MG tablet Take 1 tablet (40 mg total) by mouth daily. Patient taking differently: Take 40 mg by mouth 2 (two) times daily.  10/31/15  Yes Aquilla Hacker, MD  Probiotic Product (PROBIOTIC DAILY PO) Take 1 capsule by mouth daily.   Yes Historical Provider, MD  Pseudoeph-Doxylamine-DM-APAP (NYQUIL PO) Take 30 mLs by mouth at bedtime as needed (sleep).   Yes Historical Provider, MD  ramipril (ALTACE) 1.25 MG capsule Take 1 capsule (1.25 mg total) by mouth daily. 12/10/13  Yes Charolette Forward, MD  capsaicin (ZOSTRIX) 0.025 % cream APPLY TOPICALLY 2 TIMES A DAY FOR 7 DAYS Patient not taking: Reported on 01/28/2016 05/02/15   Leone Haven, MD   BP 140/90 mmHg  Pulse 69  Temp(Src) 97.5 F (36.4 C) (Oral)  Resp 16  Ht 6\' 1"  (1.854 m)  Wt 156 lb (70.761 kg)  BMI 20.59 kg/m2  SpO2 99% Physical Exam Physical Exam  Nursing note and vitals reviewed. Constitutional: Well developed, well nourished, non-toxic, and in no acute distress Head: Normocephalic and atraumatic.  Mouth/Throat: Oropharynx is clear and moist.  Neck: Normal range of motion. Neck supple.  Cardiovascular: Normal rate and irregularly irregular rhythm.  No edema Pulmonary/Chest: Effort normal and breath sounds normal.  Abdominal: Soft. There is no tenderness. There is no rebound and no guarding.  Musculoskeletal: Normal range of motion.  Neurological: Alert, no facial droop, fluent speech, moves all extremities symmetrically Skin: Skin is warm and dry.  Psychiatric: Cooperative  ED Course  Procedures (including critical care time) Labs Review Labs Reviewed  CBC WITH DIFFERENTIAL/PLATELET -  Abnormal; Notable for the following:    WBC 3.4 (*)    MCV 75.8 (*)    MCH 25.3 (*)    RDW 17.3 (*)    Neutro Abs 1.3 (*)    All other components within normal limits  BASIC METABOLIC PANEL - Abnormal; Notable for the following:    CO2 21 (*)    Creatinine, Ser 1.40 (*)    GFR calc non Af Amer 43 (*)    GFR calc Af Amer 49 (*)    All other components within normal limits  I-STAT TROPOININ, ED  Randolm Idol, ED    Imaging Review Dg Chest 2 View  01/28/2016  CLINICAL DATA:  Chest pain and cough. EXAM: CHEST  2 VIEW COMPARISON:  November 20, 2015. FINDINGS: Stable cardiomegaly. Left-sided pacemaker is unchanged in position. No pneumothorax or pleural effusion is noted. Anterior osteophyte formation is  noted in mid thoracic spine. No acute pulmonary disease is noted. Narrowing of subacromial space is noted bilaterally suggesting rotator cuff injury. IMPRESSION: No active cardiopulmonary disease. Electronically Signed   By: Marijo Conception, M.D.   On: 01/28/2016 08:23   I have personally reviewed and evaluated these images and lab results as part of my medical decision-making.   EKG Interpretation   Date/Time:  Saturday January 28 2016 11:46:02 EST Ventricular Rate:  76 PR Interval:    QRS Duration: 129 QT Interval:  421 QTC Calculation: 473 R Axis:   -78 Text Interpretation:  Afib/flut and V-paced complexes No further rhythm  analysis attempted due to paced rhythm Nonspecific IVCD with LAD  Nonspecific T abnormalities, lateral leads No significant change since  last tracing Confirmed by Paulene Tayag MD, Safal Halderman [id number]) on 01/28/2016 12:02:51 PM      MDM   Final diagnoses:  Gastroesophageal reflux disease with esophagitis  Chest pain, unspecified chest pain type    82 year old man who presents with burning chest pain. States that this exact same as prior history of GERD, although does have some cardiac risk factors. On chart review, has had multiple visits for same symptoms, with    Symptoms improved after GI cocktail. EKG is nonischemic  On arrival. Serial troponins are negative and serial EKG without dynamic changes. I did give him GI cocktail, with full resolution of symptoms. Chest x-ray showing no acute cardiopulmonary processes. Pain seems atypical for that of ACS, this typically resolved with standing up and walking/exertional activity. He sees Dr. Terrence Dupont next week and will discuss ED visit.  Discussed strict return follow-up instructions. He expressed understanding of all discharge instructions and felt comfortable to plan of care.   Forde Dandy, MD 01/28/16 [phone]

## 2016-01-28 NOTE — Discharge Instructions (Signed)
Return without fail for worsening symptoms, including worsening pain, difficulty breathing, vomiting and unable to keep down food/fluids, severe abdominal or back pain, or any other symptoms concerning to you.  Gastroesophageal Reflux Disease, Adult Normally, food travels down the esophagus and stays in the stomach to be digested. However, when a person has gastroesophageal reflux disease (GERD), food and stomach acid move back up into the esophagus. When this happens, the esophagus becomes sore and inflamed. Over time, GERD can create small holes (ulcers) in the lining of the esophagus.  CAUSES This condition is caused by a problem with the muscle between the esophagus and the stomach (lower esophageal sphincter, or LES). Normally, the LES muscle closes after food passes through the esophagus to the stomach. When the LES is weakened or abnormal, it does not close properly, and that allows food and stomach acid to go back up into the esophagus. The LES can be weakened by certain dietary substances, medicines, and medical conditions, including:  Tobacco use.  Pregnancy.  Having a hiatal hernia.  Heavy alcohol use.  Certain foods and beverages, such as coffee, chocolate, onions, and peppermint. RISK FACTORS This condition is more likely to develop in:  People who have an increased body weight.  People who have connective tissue disorders.  People who use NSAID medicines. SYMPTOMS Symptoms of this condition include:  Heartburn.  Difficult or painful swallowing.  The feeling of having a lump in the throat.  Abitter taste in the mouth.  Bad breath.  Having a large amount of saliva.  Having an upset or bloated stomach.  Belching.  Chest pain.  Shortness of breath or wheezing.  Ongoing (chronic) cough or a night-time cough.  Wearing away of tooth enamel.  Weight loss. Different conditions can cause chest pain. Make sure to see your health care provider if you experience  chest pain. DIAGNOSIS Your health care provider will take a medical history and perform a physical exam. To determine if you have mild or severe GERD, your health care provider may also monitor how you respond to treatment. You may also have other tests, including:  An endoscopy toexamine your stomach and esophagus with a small camera.  A test thatmeasures the acidity level in your esophagus.  A test thatmeasures how much pressure is on your esophagus.  A barium swallow or modified barium swallow to show the shape, size, and functioning of your esophagus. TREATMENT The goal of treatment is to help relieve your symptoms and to prevent complications. Treatment for this condition may vary depending on how severe your symptoms are. Your health care provider may recommend:  Changes to your diet.  Medicine.  Surgery. HOME CARE INSTRUCTIONS Diet  Follow a diet as recommended by your health care provider. This may involve avoiding foods and drinks such as:  Coffee and tea (with or without caffeine).  Drinks that containalcohol.  Energy drinks and sports drinks.  Carbonated drinks or sodas.  Chocolate and cocoa.  Peppermint and mint flavorings.  Garlic and onions.  Horseradish.  Spicy and acidic foods, including peppers, chili powder, curry powder, vinegar, hot sauces, and barbecue sauce.  Citrus fruit juices and citrus fruits, such as oranges, lemons, and limes.  Tomato-based foods, such as red sauce, chili, salsa, and pizza with red sauce.  Fried and fatty foods, such as donuts, french fries, potato chips, and high-fat dressings.  High-fat meats, such as hot dogs and fatty cuts of red and white meats, such as rib eye steak, sausage, ham, and  bacon.  High-fat dairy items, such as whole milk, butter, and cream cheese.  Eat small, frequent meals instead of large meals.  Avoid drinking large amounts of liquid with your meals.  Avoid eating meals during the 2-3 hours  before bedtime.  Avoid lying down right after you eat.  Do not exercise right after you eat. General Instructions  Pay attention to any changes in your symptoms.  Take over-the-counter and prescription medicines only as told by your health care provider. Do not take aspirin, ibuprofen, or other NSAIDs unless your health care provider told you to do so.  Do not use any tobacco products, including cigarettes, chewing tobacco, and e-cigarettes. If you need help quitting, ask your health care provider.  Wear loose-fitting clothing. Do not wear anything tight around your waist that causes pressure on your abdomen.  Raise (elevate) the head of your bed 6 inches (15cm).  Try to reduce your stress, such as with yoga or meditation. If you need help reducing stress, ask your health care provider.  If you are overweight, reduce your weight to an amount that is healthy for you. Ask your health care provider for guidance about a safe weight loss goal.  Keep all follow-up visits as told by your health care provider. This is important. SEEK MEDICAL CARE IF:  You have new symptoms.  You have unexplained weight loss.  You have difficulty swallowing, or it hurts to swallow.  You have wheezing or a persistent cough.  Your symptoms do not improve with treatment.  You have a hoarse voice. SEEK IMMEDIATE MEDICAL CARE IF:  You have pain in your arms, neck, jaw, teeth, or back.  You feel sweaty, dizzy, or light-headed.  You have chest pain or shortness of breath.  You vomit and your vomit looks like blood or coffee grounds.  You faint.  Your stool is bloody or black.  You cannot swallow, drink, or eat.   This information is not intended to replace advice given to you by your health care provider. Make sure you discuss any questions you have with your health care provider.   Document Released: 09/19/2005 Document Revised: 08/31/2015 Document Reviewed: 04/06/2015 Elsevier Interactive  Patient Education [employer].

## 2016-01-28 NOTE — ED Notes (Signed)
Pt reports relief of burning chest pain after GI cocktail.

## 2016-02-09 ENCOUNTER — Telehealth: Payer: Self-pay | Admitting: *Deleted

## 2016-02-09 NOTE — Telephone Encounter (Signed)
Called patient to offer flu vaccine. Scheduled patient to receive flu vaccine on 02/15/2016 at 0915. Daughter, Henderson Newcomer, notified of this appt per patient request. Velora Heckler, RN

## 2016-02-15 ENCOUNTER — Ambulatory Visit (INDEPENDENT_AMBULATORY_CARE_PROVIDER_SITE_OTHER): Payer: Medicare Other | Admitting: *Deleted

## 2016-02-15 ENCOUNTER — Ambulatory Visit (INDEPENDENT_AMBULATORY_CARE_PROVIDER_SITE_OTHER): Payer: Medicare Other | Admitting: Internal Medicine

## 2016-02-15 VITALS — BP 114/66 | HR 62 | Ht 73.0 in | Wt 159.6 lb

## 2016-02-15 DIAGNOSIS — R001 Bradycardia, unspecified: Secondary | ICD-10-CM | POA: Diagnosis not present

## 2016-02-15 DIAGNOSIS — Z23 Encounter for immunization: Secondary | ICD-10-CM | POA: Diagnosis not present

## 2016-02-15 DIAGNOSIS — Z950 Presence of cardiac pacemaker: Secondary | ICD-10-CM | POA: Diagnosis not present

## 2016-02-15 DIAGNOSIS — I482 Chronic atrial fibrillation, unspecified: Secondary | ICD-10-CM

## 2016-02-15 LAB — CUP PACEART INCLINIC DEVICE CHECK
Battery Voltage: 2.98 V
Brady Statistic RV Percent Paced: 90 %
Implantable Lead Implant Date: 20130903
Implantable Lead Model: 1948
Lead Channel Impedance Value: 550 Ohm
Lead Channel Pacing Threshold Amplitude: 0.75 V
Lead Channel Pacing Threshold Pulse Width: 0.4 ms
Lead Channel Sensing Intrinsic Amplitude: 10.2 mV
Lead Channel Setting Pacing Amplitude: 1 V
Lead Channel Setting Pacing Pulse Width: 0.4 ms
MDC IDC LEAD LOCATION: 753860
MDC IDC MSMT BATTERY REMAINING LONGEVITY: 148.8
MDC IDC MSMT LEADCHNL RV PACING THRESHOLD AMPLITUDE: 0.75 V
MDC IDC MSMT LEADCHNL RV PACING THRESHOLD PULSEWIDTH: 0.4 ms
MDC IDC SESS DTM: 20170222125109
MDC IDC SET LEADCHNL RV SENSING SENSITIVITY: 2 mV
Pulse Gen Serial Number: 7230364

## 2016-02-15 NOTE — Patient Instructions (Signed)
Medication Instructions:  Your physician recommends that you continue on your current medications as directed. Please refer to the Current Medication list given to you today.   Labwork: None ordered   Testing/Procedures: None ordered   Follow-Up: Your physician wants you to follow-up in: 12 months with Chanetta Marshall, NP You will receive a reminder letter in the mail two months in advance. If you don't receive a letter, please call our office to schedule the follow-up appointment.  Remote monitoring is used to monitor your Pacemaker  from home. This monitoring reduces the number of office visits required to check your device to one time per year. It allows [place] to keep an eye on the functioning of your device to ensure it is working properly. You are scheduled for a device check from home on 05/16/16. You may send your transmission at any time that day. If you have a wireless device, the transmission will be sent automatically. After your physician reviews your transmission, you will receive a postcard with your next transmission date.     Any Other Special Instructions Will Be Listed Below (If Applicable).     If you need a refill on your cardiac medications before your next appointment, please call your pharmacy.

## 2016-02-15 NOTE — Progress Notes (Signed)
Electrophysiology Office Note   Date:  02/15/2016   ID:  [name] [name], DOB 05-18-1923, MRN [id number]  PCP:  Paula Compton, MD  Cardiologist:  Dr Terrence Dupont   Chief Complaint  Patient presents with  . Atrial Fibrillation     History of Present Illness: [name] [name] is a 82 y.o. male who presents today for electrophysiology evaluation.   He has done well since his last visit.   He denies CP, SOB.  He has unsteadiness and dizziness.  Dr Terrence Dupont has stopped anticoagulation for AF previously.  Today, he denies symptoms of palpitations,  lower extremity edema, claudication, presyncope, syncope, bleeding, or neurologic sequela. The patient is tolerating medications without difficulties and is otherwise without complaint today.   Past Medical History  Diagnosis Date  . Hypertension   . CHF (congestive heart failure) ([place])   . GERD (gastroesophageal reflux disease)   . Recurrent upper respiratory infection (URI)     recent cold- treating /w OTC  . Arthritis     "legs are stiff" , had spinal injections for pain relieve 3-4 yrs. ago  . Atrial fibrillation-permanent   . Constipation   . Depression   . Nonischemic/ ischemic cardiomyopathy     Single-vessel coronary disease catheterization 2004-Myoview showing inferior wall perfusion defect Myoview 2006  . Subdural hematoma ([facility])     Prior evacuation  . Cataract   . Bradycardia   . Diverticulosis   . AAA (abdominal aortic aneurysm) without rupture ([place])     10/2012 CT scan: 4.3x4.6cm   Past Surgical History  Procedure Laterality Date  . Evacuation of subdural hematoma    . Eye surgery      cataract extracted in L eye  . Hernia repair      R side inguinal repair   . Brain surgery      2002  . Tonsillectomy      1957  . Cataract extraction w/phaco  01/30/2012    Procedure: CATARACT EXTRACTION PHACO AND INTRAOCULAR LENS PLACEMENT (IOC);  Surgeon: Adonis Brook, MD;  Location: [facility];  Service: Ophthalmology;  Laterality: Right;    . Pars plana vitrectomy  07/30/2012    Procedure: PARS PLANA VITRECTOMY WITH 23 GAUGE;  Surgeon: Adonis Brook, MD;  Location: [facility];  Service: Ophthalmology;  Laterality: Right;  Insertion of Silicone Oil  . Gas/fluid exchange  07/30/2012    Procedure: GAS/FLUID EXCHANGE;  Surgeon: Adonis Brook, MD;  Location: [facility];  Service: Ophthalmology;  Laterality: Right;  . Pacemaker insertion  08/26/12    SJM Accent SR RF pacemaker  . Permanent pacemaker insertion N/A 08/26/2012    Procedure: PERMANENT PACEMAKER INSERTION;  Surgeon: Thompson Grayer, MD;  Location: [facility] CATH LAB;  Service: Cardiovascular;  Laterality: N/A;     Current Outpatient Prescriptions  Medication Sig Dispense Refill  . aspirin EC 81 MG tablet Take 81 mg by mouth daily.    . carvedilol (COREG) 3.125 MG tablet Take 1 tablet (3.125 mg total) by mouth 2 (two) times daily with a meal. 60 tablet 3  . clopidogrel (PLAVIX) 75 MG tablet Take 75 mg by mouth. Pt taking half of 75 mg daily    . docusate sodium (COLACE) 100 MG capsule Take 1 capsule (100 mg total) by mouth daily as needed for mild constipation. 10 capsule 0  . pantoprazole (PROTONIX) 40 MG tablet Take 40 mg by mouth 2 (two) times daily.    . Probiotic Product (PROBIOTIC DAILY PO) Take 1 capsule by mouth daily.    Marland Kitchen  Pseudoeph-Doxylamine-DM-APAP (NYQUIL PO) Take 30 mLs by mouth at bedtime as needed (sleep).    . ramipril (ALTACE) 1.25 MG capsule Take 1 capsule (1.25 mg total) by mouth daily. 30 capsule 3   No current facility-administered medications for this visit.    Allergies:   Review of patient's allergies indicates no known allergies.   Social History:  The patient  reports that he quit smoking about 21 years ago. He has never used smokeless tobacco. He reports that he does not drink alcohol or use illicit drugs.   Family History:  The patient's family history is negative for Anesthesia problems, Hypotension, Malignant hyperthermia, and Pseudochol deficiency.    ROS:   Please see the history of present illness.   All other systems are reviewed and negative.    PHYSICAL EXAM: VS:  BP 114/66 mmHg  Pulse 62  Ht 6\' 1"  (1.854 m)  Wt 159 lb 9.6 oz (72.394 kg)  BMI 21.06 kg/m2 , BMI Body mass index is 21.06 kg/(m^2). GEN: elderly, walks slowly with a cane, in no acute distress HEENT: normal Neck: no JVD, carotid bruits, or masses Cardiac: RRR (paced); no murmurs, rubs, or gallops,no edema  Respiratory:  clear to auscultation bilaterally, normal work of breathing GI: soft, nontender, nondistended, + BS MS: no deformity or atrophy Skin: warm and dry,   device pocket is well healed Neuro:  Strength and sensation are intact Psych: euthymic mood, full affect   Device interrogation is reviewed today in detail.  See PaceArt for details.  Recent Labs: 01/28/2016: BUN 11; Creatinine, Ser 1.40*; Hemoglobin 14.1; Platelets 150; Potassium 4.7; Sodium 140  No results found for requested labs within last 365 days.      Wt Readings from Last 3 Encounters:  02/15/16 159 lb 9.6 oz (72.394 kg)  01/28/16 156 lb (70.761 kg)  08/25/15 160 lb 8 oz (72.802 kg)      ASSESSMENT AND PLAN:  1.  Symptomatic bradycardia (complete heart block) Normal pacemaker function See Pace Art report No changes today  2. Permanent afib chads2vasc score is at least 5.  Dr Terrence Dupont has stopped anticoagulation due to unsteadiness.  This may be best No changes today    Current medicines are reviewed at length with the patient today.  The patient is without any concerns regarding medicines and no changes are made today.    Disposition:   FU with EP PN in 1 year merlin   Army Fossa MD 02/15/2016 10:34 PM      [facility] [address] [place] [address]  [phone] (office) [phone] (fax)

## 2016-02-21 ENCOUNTER — Encounter: Payer: Self-pay | Admitting: Internal Medicine

## 2016-02-23 ENCOUNTER — Ambulatory Visit (HOSPITAL_COMMUNITY)
Admission: RE | Admit: 2016-02-23 | Discharge: 2016-02-23 | Disposition: A | Discharge status: Home / Self Care | Payer: Medicare Other | Source: Ambulatory Visit | Attending: Vascular Surgery | Admitting: Vascular Surgery

## 2016-02-23 ENCOUNTER — Ambulatory Visit: Payer: Medicare Other | Admitting: Vascular Surgery

## 2016-02-23 DIAGNOSIS — I110 Hypertensive heart disease with heart failure: Secondary | ICD-10-CM | POA: Insufficient documentation

## 2016-02-23 DIAGNOSIS — I714 Abdominal aortic aneurysm, without rupture, unspecified: Secondary | ICD-10-CM

## 2016-02-23 DIAGNOSIS — I509 Heart failure, unspecified: Secondary | ICD-10-CM | POA: Diagnosis not present

## 2016-03-15 ENCOUNTER — Ambulatory Visit: Payer: Medicare Other | Admitting: Vascular Surgery

## 2016-03-20 ENCOUNTER — Encounter: Payer: Self-pay | Admitting: Vascular Surgery

## 2016-03-27 ENCOUNTER — Encounter: Payer: Self-pay | Admitting: Vascular Surgery

## 2016-03-27 NOTE — Progress Notes (Signed)
[facility] [place]     History of Present Illness:  Patient is a 82 y.o. male who presents for evaluation of abdominal aortic aneurysm. Patient has a history of abdominal aortic aneurysm dating back at least to 2013. At that time he had a CT scan which showed the aneurysm was 4.3 cm in diameter. He has had intermittent ultrasounds since then documenting the aneurysm was 4.5 cm in 2015. Most recently he had an ultrasound which showed aneurysm was 4.9 cm in diameter on a workup for reflux. The patient has chronic back pain. He denies any new or different back pain. He denies abdominal pain. He is overall reasonably independent. He does live with a caregiver. However he does perform his own activities of daily living. He was previously on anticoagulation for atrial fibrillation but this was stopped due to instability of gait. Other medical problems include hypertension, congestive failure, reflux, arthritis, atrial fibrillation, has pacemaker. All of these problems are currently stable with the exception of his reflux which occasionally cause some burning in his chest but is not very frequent and does not cause any combined shortness of breath or chest pressure-type symptoms. This sounds mainly like reflux.Marland Kitchen He returns today for follow-up after ultrasound of the aorta. He has had no change in his symptoms. He does complain of intermittent constipation.    ROS:    Cardiac: No recent episodes of chest pain/pressure, no shortness of breath at rest.  + shortness of breath with exertion. + history of atrial fibrillation or irregular heartbeat  Pulmonary: No home oxygen, no productive cough, no hemoptysis,  No asthma or wheezing  Musculoskeletal:  [ ]  Arthritis, [x ] Low back pain,  [ ]  Joint pain   Physical Examination  Filed Vitals:   03/28/16 0942  BP: 108/70  Pulse: 60  Temp: 96.9 F (36.1 C)  TempSrc: Oral  Resp: 14  Height: 6\' 1"  (1.854 m)  Weight: 157 lb (71.215 kg)   SpO2: 96%      General:  Alert and oriented, no acute distress Abdomen: Pulsatile mass slightly tender to palpation in the epigastrium  DATA:  recent ultrasound of the aorta shows aneurysm diameter is 4.5 cm in essentially unchanged.   ASSESSMENT:  Patient with abdominal aortic aneurysm currently 4.5 cm in diameter. This represents a 3 mm growth over 3 years but no growth over the last 6 months. The patient also has chronic back pain. I do not believe this is related to his aneurysm.   PLAN:  I had a discussion today with the patient and his daughter regarding aneurysm size growth and risk of rupture. I discussed with her that the risk of rupture of aneurysm less than 5-1/2 cm in diameter is less than 0.5%. We also discussed the possibility of aneurysms can grow over time and risk of increased aneurysm growth with poorly controlled hypertension or chronic coughing with COPD. We also discussed risk versus benefit of aneurysm repair especially in a 82 year old.  I again emphasized to the patient and his family that if he has worsening back pain or increased abdominal pain he should let the emergency room physician noted that he has an abdominal aortic aneurysm.  I also advised him to increase his fiber intake in fruits and vegetables as well as water intake to try to improve his constipation symptoms. He will also take some intermittent Maalox if necessary to help with his reflux symptoms in addition to his Protonix.  Ruta Hinds, MD [facility] [place] Office: [phone] Pager: [phone]

## 2016-03-28 ENCOUNTER — Ambulatory Visit (INDEPENDENT_AMBULATORY_CARE_PROVIDER_SITE_OTHER): Payer: Medicare Other | Admitting: Vascular Surgery

## 2016-03-28 ENCOUNTER — Encounter: Payer: Self-pay | Admitting: Vascular Surgery

## 2016-03-28 VITALS — BP 108/70 | HR 60 | Temp 96.9°F | Resp 14 | Ht 73.0 in | Wt 157.0 lb

## 2016-03-28 DIAGNOSIS — I714 Abdominal aortic aneurysm, without rupture, unspecified: Secondary | ICD-10-CM

## 2016-04-19 ENCOUNTER — Other Ambulatory Visit: Payer: Self-pay | Admitting: *Deleted

## 2016-04-19 DIAGNOSIS — I714 Abdominal aortic aneurysm, without rupture, unspecified: Secondary | ICD-10-CM

## 2016-05-16 ENCOUNTER — Ambulatory Visit (INDEPENDENT_AMBULATORY_CARE_PROVIDER_SITE_OTHER): Payer: Medicare Other | Admitting: *Deleted

## 2016-05-16 DIAGNOSIS — Z950 Presence of cardiac pacemaker: Secondary | ICD-10-CM

## 2016-05-16 DIAGNOSIS — R001 Bradycardia, unspecified: Secondary | ICD-10-CM | POA: Diagnosis not present

## 2016-05-16 NOTE — Progress Notes (Signed)
Remote pacemaker transmission.   

## 2016-06-04 LAB — CUP PACEART REMOTE DEVICE CHECK
Brady Statistic RV Percent Paced: 95 %
Implantable Lead Implant Date: 20130903
Implantable Lead Location: 753860
Implantable Lead Model: 1948
Lead Channel Pacing Threshold Amplitude: 0.75 V
Lead Channel Sensing Intrinsic Amplitude: 10.2 mV
Lead Channel Setting Pacing Amplitude: 1 V
Lead Channel Setting Sensing Sensitivity: 2 mV
MDC IDC MSMT BATTERY REMAINING LONGEVITY: 152 mo
MDC IDC MSMT BATTERY REMAINING PERCENTAGE: 95.5 %
MDC IDC MSMT BATTERY VOLTAGE: 2.98 V
MDC IDC MSMT LEADCHNL RV IMPEDANCE VALUE: 580 Ohm
MDC IDC MSMT LEADCHNL RV PACING THRESHOLD PULSEWIDTH: 0.4 ms
MDC IDC SESS DTM: 20170524075500
MDC IDC SET LEADCHNL RV PACING PULSEWIDTH: 0.4 ms
Pulse Gen Serial Number: 7230364

## 2016-06-12 ENCOUNTER — Encounter: Payer: Self-pay | Admitting: Cardiology

## 2016-08-15 ENCOUNTER — Ambulatory Visit (INDEPENDENT_AMBULATORY_CARE_PROVIDER_SITE_OTHER): Payer: Medicare Other | Admitting: *Deleted

## 2016-08-15 DIAGNOSIS — Z950 Presence of cardiac pacemaker: Secondary | ICD-10-CM

## 2016-08-15 DIAGNOSIS — R001 Bradycardia, unspecified: Secondary | ICD-10-CM

## 2016-08-15 NOTE — Progress Notes (Signed)
Remote pacemaker transmission.   

## 2016-08-22 ENCOUNTER — Encounter: Payer: Self-pay | Admitting: Cardiology

## 2016-08-28 LAB — CUP PACEART REMOTE DEVICE CHECK
Brady Statistic RV Percent Paced: 95 %
Lead Channel Impedance Value: 580 Ohm
Lead Channel Pacing Threshold Amplitude: 0.75 V
Lead Channel Sensing Intrinsic Amplitude: 9 mV
Lead Channel Setting Pacing Amplitude: 1 V
MDC IDC LEAD IMPLANT DT: 20130903
MDC IDC LEAD LOCATION: 753860
MDC IDC LEAD MODEL: 1948
MDC IDC MSMT BATTERY REMAINING LONGEVITY: 143 mo
MDC IDC MSMT BATTERY REMAINING PERCENTAGE: 95.5 %
MDC IDC MSMT BATTERY VOLTAGE: 2.96 V
MDC IDC MSMT LEADCHNL RV PACING THRESHOLD PULSEWIDTH: 0.4 ms
MDC IDC SESS DTM: 20170823060010
MDC IDC SET LEADCHNL RV PACING PULSEWIDTH: 0.4 ms
MDC IDC SET LEADCHNL RV SENSING SENSITIVITY: 2 mV
Pulse Gen Serial Number: 7230364

## 2016-10-08 ENCOUNTER — Encounter: Payer: Self-pay | Admitting: Vascular Surgery

## 2016-10-11 ENCOUNTER — Encounter: Payer: Self-pay | Admitting: Vascular Surgery

## 2016-10-11 ENCOUNTER — Ambulatory Visit (HOSPITAL_COMMUNITY)
Admission: RE | Admit: 2016-10-11 | Discharge: 2016-10-11 | Disposition: A | Discharge status: Home / Self Care | Payer: Medicare Other | Source: Ambulatory Visit | Attending: Vascular Surgery | Admitting: Vascular Surgery

## 2016-10-11 ENCOUNTER — Ambulatory Visit (INDEPENDENT_AMBULATORY_CARE_PROVIDER_SITE_OTHER): Payer: Medicare Other | Admitting: Vascular Surgery

## 2016-10-11 VITALS — BP 113/74 | HR 64 | Temp 97.1°F | Resp 14 | Ht 73.0 in | Wt 153.2 lb

## 2016-10-11 DIAGNOSIS — I7789 Other specified disorders of arteries and arterioles: Secondary | ICD-10-CM | POA: Insufficient documentation

## 2016-10-11 DIAGNOSIS — I714 Abdominal aortic aneurysm, without rupture, unspecified: Secondary | ICD-10-CM

## 2016-10-11 NOTE — Progress Notes (Signed)
[facility] [place]     History of Present Illness:  Patient is a 82 y.o. male who presents for evaluation of abdominal aortic aneurysm. Patient has a history of abdominal aortic aneurysm dating back at least to 2013. At that time he had a CT scan which showed the aneurysm was 4.3 cm in diameter. He has had intermittent ultrasounds since then documenting the aneurysm was 4.5 cm in 2015. Most recently he had an ultrasound which showed aneurysm was 4.9 cm in diameter on a workup for reflux. The patient has chronic back pain. He denies any new or different back pain. He denies abdominal pain. He is overall reasonably independent. He does live with a caregiver. However he does perform his own activities of daily living. He was previously on anticoagulation for atrial fibrillation but this was stopped due to instability of gait. Other medical problems include hypertension, congestive failure, reflux, arthritis, atrial fibrillation, has pacemaker. All of these problems are currently stable with the exception of his reflux which occasionally cause some burning in his chest but is not very frequent and does not cause any combined shortness of breath or chest pressure-type symptoms. This sounds mainly like reflux.Marland Kitchen He returns today for follow-up after ultrasound of the aorta. He has had no change in his symptoms. He does complain of intermittent constipation.  He does still complain of problems related to his reflux. I discussed this at length with him today. It seems like his reflux symptoms are primarily related to diet. He likes eating certain foods that cause reflux. I told him that if he is willing to undergo reflux symptoms he can go ahead and eat as he wishes otherwise she should follow his doctor's advice regarding his dietary indiscretions.   ROS:     Cardiac: No recent episodes of chest pain/pressure, no shortness of breath at rest.  + shortness of breath with exertion. + history of  atrial fibrillation or irregular heartbeat   Pulmonary: No home oxygen, no productive cough, no hemoptysis,  No asthma or wheezing   Musculoskeletal:  [ ]  Arthritis, [x ] Low back pain,  [ ]  Joint pain     Physical Examination    Vitals:   10/11/16 0842  BP: 113/74  Pulse: 64  Resp: 14  Temp: 97.1 F (36.2 C)  TempSrc: Oral  SpO2: 96%  Weight: 153 lb 3.2 oz (69.5 kg)  Height: 6\' 1"  (1.854 m)       General:  Alert and oriented, no acute distress Abdomen: Pulsatile mass slightly tender to palpation in the epigastrium   DATA:  recent ultrasound of the aorta shows aneurysm diameter is 4.3 cm in essentially unchanged.     ASSESSMENT:  Patient with abdominal aortic aneurysm currently 4.3 cm in diameter. Essentially unchanged over the last 3-4 years. The patient also has chronic back pain. I do not believe this is related to his aneurysm.    PLAN:  I had a discussion today with the patient and his daughter regarding aneurysm size growth and risk of rupture. I discussed with her that the risk of rupture of aneurysm less than 5-1/2 cm in diameter is less than 0.5%. We also discussed the possibility of aneurysms can grow over time and risk of increased aneurysm growth with poorly controlled hypertension or chronic coughing with COPD. We also discussed risk versus benefit of aneurysm repair especially in a 82 year old.  I again emphasized to the patient and his family that if he has worsening  back pain or increased abdominal pain he should let the emergency room physician noted that he has an abdominal aortic aneurysm.   I also advised him to increase his fiber intake in fruits and vegetables as well as water intake to try to improve his constipation symptoms.    Ruta Hinds, MD [facility] [place] Office: [phone] Pager: [phone]

## 2016-11-06 NOTE — Addendum Note (Signed)
Addended by: Thresa Ross C on: 11/06/2016 01:10 PM   Modules accepted: Orders

## 2016-11-14 ENCOUNTER — Ambulatory Visit (INDEPENDENT_AMBULATORY_CARE_PROVIDER_SITE_OTHER): Payer: Medicare Other | Admitting: *Deleted

## 2016-11-14 DIAGNOSIS — I482 Chronic atrial fibrillation, unspecified: Secondary | ICD-10-CM

## 2016-11-14 NOTE — Progress Notes (Signed)
Remote pacemaker transmission.   

## 2016-11-22 ENCOUNTER — Encounter: Payer: Self-pay | Admitting: Cardiology

## 2016-12-06 ENCOUNTER — Emergency Department (HOSPITAL_COMMUNITY)
Admission: EM | Admit: 2016-12-06 | Discharge: 2016-12-06 | Disposition: A | Discharge status: Home / Self Care | Payer: Medicare Other | Attending: Emergency Medicine | Admitting: Emergency Medicine

## 2016-12-06 ENCOUNTER — Encounter (HOSPITAL_COMMUNITY): Payer: Self-pay | Admitting: Emergency Medicine

## 2016-12-06 ENCOUNTER — Emergency Department (HOSPITAL_COMMUNITY): Payer: Medicare Other

## 2016-12-06 DIAGNOSIS — K2100 Gastro-esophageal reflux disease with esophagitis, without bleeding: Secondary | ICD-10-CM

## 2016-12-06 DIAGNOSIS — Z950 Presence of cardiac pacemaker: Secondary | ICD-10-CM | POA: Diagnosis not present

## 2016-12-06 DIAGNOSIS — Z7982 Long term (current) use of aspirin: Secondary | ICD-10-CM | POA: Diagnosis not present

## 2016-12-06 DIAGNOSIS — Z87891 Personal history of nicotine dependence: Secondary | ICD-10-CM | POA: Diagnosis not present

## 2016-12-06 DIAGNOSIS — R1013 Epigastric pain: Secondary | ICD-10-CM | POA: Diagnosis present

## 2016-12-06 DIAGNOSIS — I509 Heart failure, unspecified: Secondary | ICD-10-CM | POA: Insufficient documentation

## 2016-12-06 DIAGNOSIS — I110 Hypertensive heart disease with heart failure: Secondary | ICD-10-CM | POA: Insufficient documentation

## 2016-12-06 LAB — CBC WITH DIFFERENTIAL/PLATELET
BASOS ABS: 0 10*3/uL (ref 0.0–0.1)
BASOS PCT: 1 %
EOS PCT: 4 %
Eosinophils Absolute: 0.1 10*3/uL (ref 0.0–0.7)
HEMATOCRIT: 39.1 % (ref 39.0–52.0)
Hemoglobin: 13.1 g/dL (ref 13.0–17.0)
LYMPHS PCT: 47 %
Lymphs Abs: 1.4 10*3/uL (ref 0.7–4.0)
MCH: 25 pg — ABNORMAL LOW (ref 26.0–34.0)
MCHC: 33.5 g/dL (ref 30.0–36.0)
MCV: 74.5 fL — AB (ref 78.0–100.0)
Monocytes Absolute: 0.5 10*3/uL (ref 0.1–1.0)
Monocytes Relative: 15 %
Neutro Abs: 1 10*3/uL — ABNORMAL LOW (ref 1.7–7.7)
Neutrophils Relative %: 34 %
PLATELETS: 150 10*3/uL (ref 150–400)
RBC: 5.25 MIL/uL (ref 4.22–5.81)
RDW: 17.6 % — ABNORMAL HIGH (ref 11.5–15.5)
WBC: 3.1 10*3/uL — AB (ref 4.0–10.5)

## 2016-12-06 LAB — COMPREHENSIVE METABOLIC PANEL
ALBUMIN: 3.6 g/dL (ref 3.5–5.0)
ALT: 14 U/L — AB (ref 17–63)
AST: 25 U/L (ref 15–41)
Alkaline Phosphatase: 71 U/L (ref 38–126)
Anion gap: 7 (ref 5–15)
BUN: 15 mg/dL (ref 6–20)
CHLORIDE: 108 mmol/L (ref 101–111)
CO2: 23 mmol/L (ref 22–32)
Calcium: 9 mg/dL (ref 8.9–10.3)
Creatinine, Ser: 1.42 mg/dL — ABNORMAL HIGH (ref 0.61–1.24)
GFR calc Af Amer: 48 mL/min — ABNORMAL LOW (ref >60)
GFR, EST NON AFRICAN AMERICAN: 42 mL/min — AB (ref >60)
GLUCOSE: 78 mg/dL (ref 65–99)
Potassium: 4.5 mmol/L (ref 3.5–5.1)
Sodium: 138 mmol/L (ref 135–145)
Total Bilirubin: 0.8 mg/dL (ref 0.3–1.2)
Total Protein: 7.2 g/dL (ref 6.5–8.1)

## 2016-12-06 LAB — CUP PACEART REMOTE DEVICE CHECK
Date Time Interrogation Session: 20171122090431
Lead Channel Impedance Value: 580 Ohm
Lead Channel Pacing Threshold Amplitude: 0.625 V
MDC IDC LEAD IMPLANT DT: 20130903
MDC IDC LEAD LOCATION: 753860
MDC IDC LEAD MODEL: 1948
MDC IDC MSMT BATTERY REMAINING LONGEVITY: 144 mo
MDC IDC MSMT BATTERY REMAINING PERCENTAGE: 95.5 %
MDC IDC MSMT BATTERY VOLTAGE: 2.96 V
MDC IDC MSMT LEADCHNL RV PACING THRESHOLD PULSEWIDTH: 0.4 ms
MDC IDC MSMT LEADCHNL RV SENSING INTR AMPL: 11.3 mV
MDC IDC PG IMPLANT DT: 20130903
MDC IDC SET LEADCHNL RV PACING AMPLITUDE: 0.875
MDC IDC SET LEADCHNL RV PACING PULSEWIDTH: 0.4 ms
MDC IDC SET LEADCHNL RV SENSING SENSITIVITY: 2 mV
MDC IDC STAT BRADY RV PERCENT PACED: 94 %
Pulse Gen Model: 1210
Pulse Gen Serial Number: 7230364

## 2016-12-06 LAB — I-STAT CG4 LACTIC ACID, ED: Lactic Acid, Venous: 0.95 mmol/L (ref 0.5–1.9)

## 2016-12-06 LAB — I-STAT TROPONIN, ED: TROPONIN I, POC: 0.02 ng/mL (ref 0.00–0.08)

## 2016-12-06 LAB — LIPASE, BLOOD: LIPASE: 28 U/L (ref 11–51)

## 2016-12-06 MED ORDER — GI COCKTAIL ~~LOC~~
30.0000 mL | Freq: Once | ORAL | Status: AC
  Administered 2016-12-06: 30 mL via ORAL
  Filled 2016-12-06: qty 30

## 2016-12-06 MED ORDER — ALUM & MAG HYDROXIDE-SIMETH 200-200-20 MG/5ML PO SUSP: 15.0000 mL | Freq: Four times a day (QID) | ORAL | 0 refills | Status: DC | PRN

## 2016-12-06 MED ORDER — RANITIDINE HCL 150 MG PO CAPS: 150.0000 mg | ORAL_CAPSULE | Freq: Two times a day (BID) | ORAL | 0 refills | Status: DC

## 2016-12-06 NOTE — Discharge Planning (Signed)
Pt up for discharge. EDCM reviewed chart for possible CM needs.  No needs identified or communicated.  

## 2016-12-06 NOTE — ED Triage Notes (Signed)
Pt sts abd pain in epigastric area x 3 days; pt sts hx of GERD and feels same

## 2016-12-06 NOTE — Discharge Instructions (Signed)
Please follow with your primary care doctor in the next 2 days for a check-up. They must obtain records for further management.  ° °Do not hesitate to return to the Emergency Department for any new, worsening or concerning symptoms.  ° °

## 2016-12-06 NOTE — ED Notes (Signed)
Patient went to xray 

## 2016-12-06 NOTE — ED Provider Notes (Signed)
[facility] DEPT Provider Note   CSN: [id number] Arrival date & time: 12/06/16  1137     History   Chief Complaint Chief Complaint  Patient presents with  . Abdominal Pain    HPI   Blood pressure 140/79, pulse 74, temperature 97.9 F (36.6 C), temperature source Oral, resp. rate 18, SpO2 100 %.  [name] [name] is a 82 y.o. male with extensive past medical history complaining of sour brash, feeling like his food is getting hung up in his throat (he is not vomiting) epigastric abdominal discomfort described as burning over the last 2 days. He states that he's had similar episodes in the past and he comes to the emergency department and gets a GI cocktail and feels much better. He is compliant with his antacids. Has never had any EGD and has never had dilation of stricture. Take that he's been slightly more constipated than normal but denies melena, hematochezia or change in urination. States that he's been having issues sleeping because when he lays down flat the burning discomfort is worsened.  Past Medical History:  Diagnosis Date  . AAA (abdominal aortic aneurysm) without rupture ([place])    10/2012 CT scan: 4.3x4.6cm  . Arthritis    "legs are stiff" , had spinal injections for pain relieve 3-4 yrs. ago  . Atrial fibrillation-permanent   . Bradycardia   . Cataract   . CHF (congestive heart failure) ([place])   . Constipation   . Depression   . Diverticulosis   . GERD (gastroesophageal reflux disease)   . Hypertension   . Nonischemic/ ischemic cardiomyopathy    Single-vessel coronary disease catheterization 2004-Myoview showing inferior wall perfusion defect Myoview 2006  . Recurrent upper respiratory infection (URI)    recent cold- treating /w OTC  . Subdural hematoma [facility])    Prior evacuation    Patient Active Problem List   Diagnosis Date Noted  . AAA (abdominal aortic aneurysm) without rupture ([place]) 06/15/2015  . Constipation 04/30/2014  . Knee problem 04/30/2014  .  Back pain 03/21/2014  . Enlarged prostate 03/21/2014  . GERD (gastroesophageal reflux disease) 03/21/2014  . Protein-calorie malnutrition, severe ([place]) 12/08/2013  . Chest pain 12/07/2013  . Pacemaker-[employer] 08/26/2012  . Nonischemic/ ischemic cardiomyopathy   . Bradycardia   . Atrial fibrillation-permanent     Past Surgical History:  Procedure Laterality Date  . BRAIN SURGERY     2002  . CATARACT EXTRACTION W/PHACO  01/30/2012   Procedure: CATARACT EXTRACTION PHACO AND INTRAOCULAR LENS PLACEMENT (IOC);  Surgeon: Adonis Brook, MD;  Location: [facility];  Service: Ophthalmology;  Laterality: Right;  . evacuation of subdural hematoma    . EYE SURGERY     cataract extracted in L eye  . GAS/FLUID EXCHANGE  07/30/2012   Procedure: GAS/FLUID EXCHANGE;  Surgeon: Adonis Brook, MD;  Location: [facility];  Service: Ophthalmology;  Laterality: Right;  . HERNIA REPAIR     R side inguinal repair   . PACEMAKER INSERTION  08/26/12   SJM Accent SR RF pacemaker  . PARS PLANA VITRECTOMY  07/30/2012   Procedure: PARS PLANA VITRECTOMY WITH 23 GAUGE;  Surgeon: Adonis Brook, MD;  Location: [facility];  Service: Ophthalmology;  Laterality: Right;  Insertion of Silicone Oil  . PERMANENT PACEMAKER INSERTION N/A 08/26/2012   Procedure: PERMANENT PACEMAKER INSERTION;  Surgeon: Thompson Grayer, MD;  Location: [facility] CATH LAB;  Service: Cardiovascular;  Laterality: N/A;  . TONSILLECTOMY     1957       Home Medications  Prior to Admission medications   Medication Sig Start Date End Date Taking? Authorizing Provider  alum & mag hydroxide-simeth (MAALOX REGULAR STRENGTH) 200-200-20 MG/5ML suspension Take 15 mLs by mouth every 6 (six) hours as needed for indigestion or heartburn. 12/06/16   Margarite Vessel, PA-C  aspirin EC 81 MG tablet Take 81 mg by mouth daily.    Historical Provider, MD  carvedilol (COREG) 3.125 MG tablet Take 1 tablet (3.125 mg total) by mouth 2 (two) times daily with a meal. 12/10/13   Charolette Forward, MD    clopidogrel (PLAVIX) 75 MG tablet Take 75 mg by mouth. Pt taking half of 75 mg daily    Historical Provider, MD  docusate sodium (COLACE) 100 MG capsule Take 1 capsule (100 mg total) by mouth daily as needed for mild constipation. 08/11/14   Leone Haven, MD  pantoprazole (PROTONIX) 40 MG tablet Take 40 mg by mouth 2 (two) times daily.    Historical Provider, MD  Probiotic Product (PROBIOTIC DAILY PO) Take 1 capsule by mouth daily. Reported on 03/28/2016    Historical Provider, MD  Pseudoeph-Doxylamine-DM-APAP (NYQUIL PO) Take 30 mLs by mouth at bedtime as needed (sleep). Reported on 03/28/2016    Historical Provider, MD  ramipril (ALTACE) 1.25 MG capsule Take 1 capsule (1.25 mg total) by mouth daily. 12/10/13   Charolette Forward, MD  ranitidine (ZANTAC) 150 MG capsule Take 1 capsule (150 mg total) by mouth 2 (two) times daily. 12/06/16   Elmyra Ricks Bartow Zylstra, PA-C    Family History Family History  Problem Relation Age of Onset  . Anesthesia problems Neg Hx   . Hypotension Neg Hx   . Malignant hyperthermia Neg Hx   . Pseudochol deficiency Neg Hx     Social History Social History  Substance Use Topics  . Smoking status: Former Smoker    Packs/day: 0.25    Years: 15.00    Quit date: 12/24/1994  . Smokeless tobacco: Never Used  . Alcohol use No     Allergies   Patient has no known allergies.   Review of Systems Review of Systems  10 systems reviewed and found to be negative, except as noted in the HPI.   Physical Exam Updated Vital Signs BP 133/86   Pulse 65   Temp 98.2 F (36.8 C) (Oral)   Resp 17   SpO2 100%   Physical Exam  Constitutional: He is oriented to person, place, and time. He appears well-developed and well-nourished. No distress.  HENT:  Head: Normocephalic and atraumatic.  Mouth/Throat: Oropharynx is clear and moist.  Eyes: Conjunctivae and EOM are normal. Pupils are equal, round, and reactive to light.  Neck: Normal range of motion.  Cardiovascular: Normal  rate, regular rhythm and intact distal pulses.   Pulmonary/Chest: Effort normal and breath sounds normal.  Abdominal: Soft. He exhibits no distension and no mass. There is tenderness. There is no rebound and no guarding. No hernia.  Tenderness in the left epigastrium with no guarding or rebound, normoactive bowel sounds.  Musculoskeletal: Normal range of motion. He exhibits no edema.  Neurological: He is alert and oriented to person, place, and time.  Skin: He is not diaphoretic.  Psychiatric: He has a normal mood and affect.  Nursing note and vitals reviewed.    ED Treatments / Results  Labs (all labs ordered are listed, but only abnormal results are displayed) Labs Reviewed  CBC WITH DIFFERENTIAL/PLATELET - Abnormal; Notable for the following:       Result Value  WBC 3.1 (*)    MCV 74.5 (*)    MCH 25.0 (*)    RDW 17.6 (*)    Neutro Abs 1.0 (*)    All other components within normal limits  COMPREHENSIVE METABOLIC PANEL - Abnormal; Notable for the following:    Creatinine, Ser 1.42 (*)    ALT 14 (*)    GFR calc non Af Amer 42 (*)    GFR calc Af Amer 48 (*)    All other components within normal limits  LIPASE, BLOOD  I-STAT CG4 LACTIC ACID, ED  I-STAT TROPOININ, ED  I-STAT CG4 LACTIC ACID, ED    EKG  EKG Interpretation  Date/Time:  Thursday December 06 2016 11:43:03 EST Ventricular Rate:  70 PR Interval:    QRS Duration: 124 QT Interval:  404 QTC Calculation: 436 R Axis:   -83 Text Interpretation:  Undetermined rhythm  with intermittent ventricular paced comlpexes Right bundle branch block Left anterior fascicular block Septal infarct , age undetermined Abnormal ECG No significant change since last tracing Confirmed by KNOTT MD, Quillian Quince [id number]) on 12/06/2016 11:56:58 AM       Radiology Dg Abd Acute W/chest  Result Date: 12/06/2016 CLINICAL DATA:  Pt STS abd pain in epigastric area x 3 days; pt sts hx of GERD and feels same. Hx of AAA, A-fib, CHF, bradycardia,  constipation, diverticulitis, HTN, cardiomyopathy. Past-Smoker. EXAM: DG ABDOMEN ACUTE W/ 1V CHEST COMPARISON:  2/4/7 FINDINGS: Stable enlarged cardiac silhouette. Lungs are clear. No free air beneath hemidiaphragms. No dilated loops large or small bowel. No pathologic calcification. Extensive degenerative osteophytosis of the spine. IMPRESSION: 1. No acute cardiopulmonary findings. 2. No acute abdominal findings.  No evidence of constipation. Electronically Signed   By: Suzy Bouchard M.D.   On: 12/06/2016 13:03    Procedures Procedures (including critical care time)  Medications Ordered in ED Medications  gi cocktail (Maalox,Lidocaine,Donnatal) (30 mLs Oral Given 12/06/16 1223)     Initial Impression / Assessment and Plan / ED Course  I have reviewed the triage vital signs and the nursing notes.  Pertinent labs & imaging results that were available during my care of the patient were reviewed by me and considered in my medical decision making (see chart for details).  Clinical Course     Vitals:   12/06/16 1400 12/06/16 1415 12/06/16 1453 12/06/16 1457  BP: 125/83 132/84 133/86   Pulse:  62 65   Resp: 17 14 17    Temp:    98.2 F (36.8 C)  TempSrc:    Oral  SpO2:  (!) 78% 100%     Medications  gi cocktail (Maalox,Lidocaine,Donnatal) (30 mLs Oral Given 12/06/16 1223)    [name] [name] is 82 y.o. male presenting with Globus sensation in throat, he is not actively vomiting he has sour brash and burning sensation in the epigastrium symptoms are consistent with prior episodes of reflux, he states he comes to the emergency departments and get gets a GI cocktail and feels much better. He's been compliant with his Protonix at home, he does not regularly follow with gastroenterology and has never had an EGD as per patient and daughter, chart review with no GI notes. He states that he has had a colonoscopy in the past. Abdominal exam is benign.  EKG with no acute changes, blood work  reassuring. Patient feels much better after GI cocktail, advised that it is very important to follow-up with gastroenterology, patient will be started on Zantac.  This is a shared visit  with the attending physician who personally evaluated the patient and agrees with the care plan.    Evaluation does not show pathology that would require ongoing emergent intervention or inpatient treatment. Pt is hemodynamically stable and mentating appropriately. Discussed findings and plan with patient/guardian, who agrees with care plan. All questions answered. Return precautions discussed and outpatient follow up given.    Final Clinical Impressions(s) / ED Diagnoses   Final diagnoses:  Reflux esophagitis    New Prescriptions New Prescriptions   ALUM & MAG HYDROXIDE-SIMETH (MAALOX REGULAR STRENGTH) [id number] MG/5ML SUSPENSION    Take 15 mLs by mouth every 6 (six) hours as needed for indigestion or heartburn.   RANITIDINE (ZANTAC) 150 MG CAPSULE    Take 1 capsule (150 mg total) by mouth 2 (two) times daily.     Monico Blitz, PA-C 12/06/16 1458    Leo Grosser, MD 12/07/16 [phone]

## 2017-01-21 DIAGNOSIS — E785 Hyperlipidemia, unspecified: Secondary | ICD-10-CM | POA: Diagnosis not present

## 2017-01-21 DIAGNOSIS — I25118 Atherosclerotic heart disease of native coronary artery with other forms of angina pectoris: Secondary | ICD-10-CM | POA: Diagnosis not present

## 2017-01-21 DIAGNOSIS — I714 Abdominal aortic aneurysm, without rupture: Secondary | ICD-10-CM | POA: Diagnosis not present

## 2017-01-21 DIAGNOSIS — I482 Chronic atrial fibrillation: Secondary | ICD-10-CM | POA: Diagnosis not present

## 2017-01-21 DIAGNOSIS — I495 Sick sinus syndrome: Secondary | ICD-10-CM | POA: Diagnosis not present

## 2017-01-21 DIAGNOSIS — I1310 Hypertensive heart and chronic kidney disease without heart failure, with stage 1 through stage 4 chronic kidney disease, or unspecified chronic kidney disease: Secondary | ICD-10-CM | POA: Diagnosis not present

## 2017-01-21 DIAGNOSIS — Z950 Presence of cardiac pacemaker: Secondary | ICD-10-CM | POA: Diagnosis not present

## 2017-01-21 DIAGNOSIS — Z23 Encounter for immunization: Secondary | ICD-10-CM | POA: Diagnosis not present

## 2017-01-21 DIAGNOSIS — K219 Gastro-esophageal reflux disease without esophagitis: Secondary | ICD-10-CM | POA: Diagnosis not present

## 2017-01-21 DIAGNOSIS — N189 Chronic kidney disease, unspecified: Secondary | ICD-10-CM | POA: Diagnosis not present

## 2017-02-12 NOTE — Progress Notes (Signed)
Electrophysiology Office Note Date: 02/14/2017  ID:  [name] [name], DOB [birth year], MRN [id number]  PCP: [name] Goodell, MD Primary Cardiologist: [name] [name] Electrophysiologist: Allred  CC: Pacemaker follow-up  [name] [name] is a 83 y.o. male seen today for Dr [name] [name].  He presents today for routine electrophysiology followup.  Since last being seen in our clinic, the patient reports doing relatively well. His biggest complaint is with GERD. He denies chest pain, palpitations, dyspnea, PND, orthopnea, nausea, vomiting, dizziness, syncope, edema, weight gain, or early satiety.  Device History: STJ dual chamber PPM implanted 2013 for complete heart block    Past Medical History:  Diagnosis Date  . AAA (abdominal aortic aneurysm) without rupture ([name] [name])    10/2012 CT scan: 4.3x4.6cm  . Arthritis    "legs are stiff" , had spinal injections for pain relieve 3-4 yrs. ago  . Atrial fibrillation-permanent   . Bradycardia   . Cataract   . CHF (congestive heart failure) ([name] [name])   . Constipation   . Depression   . Diverticulosis   . GERD (gastroesophageal reflux disease)   . Hypertension   . Nonischemic/ ischemic cardiomyopathy    Single-vessel coronary disease catheterization 2004-Myoview showing inferior wall perfusion defect Myoview 2006  . Recurrent upper respiratory infection (URI)    recent cold- treating /w OTC  . Subdural hematoma (HCC)    Prior evacuation   Past Surgical History:  Procedure Laterality Date  . BRAIN SURGERY     2002  . CATARACT EXTRACTION W/PHACO  01/30/2012   Procedure: CATARACT EXTRACTION PHACO AND INTRAOCULAR LENS PLACEMENT (IOC);  Surgeon: [name] Brook, MD;  Location: [facility];  Service: Ophthalmology;  Laterality: Right;  . evacuation of subdural hematoma    . EYE SURGERY     cataract extracted in L eye  . GAS/FLUID EXCHANGE  07/30/2012   Procedure: GAS/FLUID EXCHANGE;  Surgeon: [name] Brook, MD;  Location: [facility];  Service: Ophthalmology;  Laterality: Right;  .  HERNIA REPAIR     R side inguinal repair   . PACEMAKER INSERTION  08/26/12   SJM Accent SR RF pacemaker  . PARS PLANA VITRECTOMY  07/30/2012   Procedure: PARS PLANA VITRECTOMY WITH 23 GAUGE;  Surgeon: [name] Brook, MD;  Location: [facility];  Service: Ophthalmology;  Laterality: Right;  Insertion of Silicone Oil  . PERMANENT PACEMAKER INSERTION N/A 08/26/2012   Procedure: PERMANENT PACEMAKER INSERTION;  Surgeon: [name] Grayer, MD;  Location: [facility] CATH LAB;  Service: Cardiovascular;  Laterality: N/A;  . TONSILLECTOMY     1955    Current Outpatient Prescriptions  Medication Sig Dispense Refill  . alum & mag hydroxide-simeth (MAALOX REGULAR STRENGTH) 200-200-20 MG/5ML suspension Take 15 mLs by mouth every 6 (six) hours as needed for indigestion or heartburn. 355 mL 0  . aspirin EC 81 MG tablet Take 81 mg by mouth daily.    . carvedilol (COREG) 3.125 MG tablet Take 1 tablet (3.125 mg total) by mouth 2 (two) times daily with a meal. 60 tablet 3  . clopidogrel (PLAVIX) 75 MG tablet Take 75 mg by mouth. Pt taking half of 75 mg daily    . docusate sodium (COLACE) 100 MG capsule Take 1 capsule (100 mg total) by mouth daily as needed for mild constipation. 10 capsule 0  . pantoprazole (PROTONIX) 40 MG tablet Take 40 mg by mouth 2 (two) times daily.    . Probiotic Product (PROBIOTIC DAILY PO) Take 1 capsule by mouth daily. Reported on 03/28/2016    . Pseudoeph-Doxylamine-DM-APAP (NYQUIL PO)  Take 30 mLs by mouth at bedtime as needed (sleep). Reported on 03/28/2016    . ramipril (ALTACE) 1.25 MG capsule Take 1 capsule (1.25 mg total) by mouth daily. 30 capsule 3  . ranitidine (ZANTAC) 150 MG capsule Take 1 capsule (150 mg total) by mouth 2 (two) times daily. 60 capsule 0   No current facility-administered medications for this visit.     Allergies:   Patient has no known allergies.   Social History: Social History   Social History  . Marital status: Widowed    Spouse name: N/A  . Number of children: N/A  .  Years of education: N/A   Occupational History  . Not on file.   Social History Main Topics  . Smoking status: Former Smoker    Packs/day: 0.25    Years: 15.00    Quit date: 12/24/1994  . Smokeless tobacco: Never Used  . Alcohol use No  . Drug use: No  . Sexual activity: Not on file   Other Topics Concern  . Not on file   Social History Narrative  . No narrative on file    Family History: Family History  Problem Relation Age of Onset  . Anesthesia problems Neg Hx   . Hypotension Neg Hx   . Malignant hyperthermia Neg Hx   . Pseudochol deficiency Neg Hx      Review of Systems: All other systems reviewed and are otherwise negative except as noted above.   Physical Exam: VS:  BP 90/62   Pulse 78   Ht 6\' 1"  (1.854 m)   Wt 168 lb (76.2 kg)   BMI 22.16 kg/m  , BMI Body mass index is 22.16 kg/m.  GEN- The patient is elderly appearing, alert and oriented x 3 today.   HEENT: normocephalic, atraumatic; sclera clear, conjunctiva pink; hearing intact; oropharynx clear; neck supple Lungs- Clear to ausculation bilaterally, normal work of breathing.  No wheezes, rales, rhonchi Heart- Regular rate and rhythm GI- soft, non-tender, non-distended, bowel sounds present Extremities- no clubbing, cyanosis, or edema MS- no significant deformity or atrophy Skin- warm and dry, no rash or lesion; PPM pocket well healed Psych- euthymic mood, full affect Neuro- strength and sensation are intact  PPM Interrogation- reviewed in detail today,  See PACEART report  EKG:  EKG is not ordered today.  Recent Labs: 12/06/2016: ALT 14; BUN 15; Creatinine, Ser 1.42; Hemoglobin 13.1; Platelets 150; Potassium 4.5; Sodium 138   Wt Readings from Last 3 Encounters:  02/14/17 168 lb (76.2 kg)  10/11/16 153 lb 3.2 oz (69.5 kg)  03/28/16 157 lb (71.2 kg)     Other studies Reviewed: Additional studies/ records that were reviewed today include: Dr [name] [name] office notes  Assessment and Plan:  1.   Complete heart block Normal PPM function See Pace Art report No changes today V threshold falsely reading as high for last year. Autothresholds turned off today   2.  Permanent atrial fibrillation V rates controlled CHADS2VASC is at least 5.  He has prior SDH and persistent gait unsteadiness. Therefore, primary cardiology does not feel that he is a candidate for [place].   3.  GERD Already on Protonix and Zantac Reviewed dietary suggestions Advised to follow up with PCP   Current medicines are reviewed at length with the patient today.   The patient does not have concerns regarding his medicines.  The following changes were made today:  none  Labs/ tests ordered today include: none No orders of the defined types  were placed in this encounter.    Disposition:   Follow up with Delilah Shan, Dr [name] [name] 1 year    Signed, Chanetta Marshall, NP 02/14/2017 9:35 AM  [facility] [address] [place] [address] [phone] (office) [phone] (fax

## 2017-02-14 ENCOUNTER — Encounter: Payer: Self-pay | Admitting: Nurse Practitioner

## 2017-02-14 ENCOUNTER — Ambulatory Visit (INDEPENDENT_AMBULATORY_CARE_PROVIDER_SITE_OTHER): Payer: Medicare Other | Admitting: Nurse Practitioner

## 2017-02-14 VITALS — BP 90/62 | HR 78 | Ht 73.0 in | Wt 168.0 lb

## 2017-02-14 DIAGNOSIS — I4821 Permanent atrial fibrillation: Secondary | ICD-10-CM

## 2017-02-14 DIAGNOSIS — I482 Chronic atrial fibrillation: Secondary | ICD-10-CM | POA: Diagnosis not present

## 2017-02-14 DIAGNOSIS — I442 Atrioventricular block, complete: Secondary | ICD-10-CM

## 2017-02-14 LAB — CUP PACEART INCLINIC DEVICE CHECK
Implantable Lead Model: 1948
Implantable Pulse Generator Implant Date: 20130903
MDC IDC LEAD IMPLANT DT: 20130903
MDC IDC LEAD LOCATION: 753860
MDC IDC SESS DTM: 20180222095228
Pulse Gen Serial Number: 7230364

## 2017-02-14 NOTE — Patient Instructions (Signed)
Medication Instructions:   Your physician recommends that you continue on your current medications as directed. Please refer to the Current Medication list given to you today.  If you need a refill on your cardiac medications before your next appointment, please call your pharmacy.  Labwork: NONE ORDERED  TODAY  Testing/Procedures: NONE ORDERED  TODAY    Follow-Up: Your physician wants you to follow-up in: [place] will receive a reminder letter in the mail two months in advance. If you don't receive a letter, please call our office to schedule the follow-up appointment.   Remote monitoring is used to monitor your Pacemaker of ICD from home. This monitoring reduces the number of office visits required to check your device to one time per year. It allows [place] to keep an eye on the functioning of your device to ensure it is working properly. You are scheduled for a device check from home on . 5/23/2018You may send your transmission at any time that day. If you have a wireless device, the transmission will be sent automatically. After your physician reviews your transmission, you will receive a postcard with your next transmission date.    Any Other Special Instructions Will Be Listed Below (If Applicable).    Food Choices for Gastroesophageal Reflux Disease, Adult When you have gastroesophageal reflux disease (GERD), the foods you eat and your eating habits are very important. Choosing the right foods can help ease your discomfort. What guidelines do I need to follow?  Choose fruits, vegetables, whole grains, and low-fat dairy products.  Choose low-fat meat, fish, and poultry.  Limit fats such as oils, salad dressings, butter, nuts, and avocado.  Keep a food diary. This helps you identify foods that cause symptoms.  Avoid foods that cause symptoms. These may be different for everyone.  Eat small meals often instead of 3 large meals a day.  Eat your meals slowly, in  a place where you are relaxed.  Limit fried foods.  Cook foods using methods other than frying.  Avoid drinking alcohol.  Avoid drinking large amounts of liquids with your meals.  Avoid bending over or lying down until 2-3 hours after eating. What foods are not recommended? These are some foods and drinks that may make your symptoms worse: Vegetables  Tomatoes. Tomato juice. Tomato and spaghetti sauce. Chili peppers. Onion and garlic. Horseradish. Fruits  Oranges, grapefruit, and lemon (fruit and juice). Meats  High-fat meats, fish, and poultry. This includes hot dogs, ribs, ham, sausage, salami, and bacon. Dairy  Whole milk and chocolate milk. Sour cream. Cream. Butter. Ice cream. Cream cheese. Drinks  Coffee and tea. Bubbly (carbonated) drinks or energy drinks. Condiments  Hot sauce. Barbecue sauce. Sweets/Desserts  Chocolate and cocoa. Donuts. Peppermint and spearmint. Fats and Oils  High-fat foods. This includes [place] fries and potato chips. Other  Vinegar. Strong spices. This includes black pepper, white pepper, red pepper, cayenne, curry powder, cloves, ginger, and chili powder. The items listed above may not be a complete list of foods and drinks to avoid. Contact your dietitian for more information.  This information is not intended to replace advice given to you by your health care provider. Make sure you discuss any questions you have with your health care provider. Document Released: 06/10/2012 Document Revised: 05/17/2016 Document Reviewed: 10/14/2013 Elsevier Interactive Patient Education  2017 [employer].

## 2017-02-27 DIAGNOSIS — H348121 Central retinal vein occlusion, left eye, with retinal neovascularization: Secondary | ICD-10-CM | POA: Diagnosis not present

## 2017-02-27 DIAGNOSIS — H25812 Combined forms of age-related cataract, left eye: Secondary | ICD-10-CM | POA: Diagnosis not present

## 2017-02-27 DIAGNOSIS — H43812 Vitreous degeneration, left eye: Secondary | ICD-10-CM | POA: Diagnosis not present

## 2017-03-19 DIAGNOSIS — H348122 Central retinal vein occlusion, left eye, stable: Secondary | ICD-10-CM | POA: Diagnosis not present

## 2017-03-19 DIAGNOSIS — H338 Other retinal detachments: Secondary | ICD-10-CM | POA: Diagnosis not present

## 2017-03-19 DIAGNOSIS — H59811 Chorioretinal scars after surgery for detachment, right eye: Secondary | ICD-10-CM | POA: Diagnosis not present

## 2017-03-19 DIAGNOSIS — H3582 Retinal ischemia: Secondary | ICD-10-CM | POA: Diagnosis not present

## 2017-03-19 DIAGNOSIS — T85398A Other mechanical complication of other ocular prosthetic devices, implants and grafts, initial encounter: Secondary | ICD-10-CM | POA: Diagnosis not present

## 2017-03-19 DIAGNOSIS — Z961 Presence of intraocular lens: Secondary | ICD-10-CM | POA: Diagnosis not present

## 2017-03-20 DIAGNOSIS — T85398A Other mechanical complication of other ocular prosthetic devices, implants and grafts, initial encounter: Secondary | ICD-10-CM | POA: Diagnosis not present

## 2017-03-20 DIAGNOSIS — T85318A Breakdown (mechanical) of other ocular prosthetic devices, implants and grafts, initial encounter: Secondary | ICD-10-CM | POA: Diagnosis not present

## 2017-03-20 DIAGNOSIS — H44751 Retained (nonmagnetic) (old) foreign body in vitreous body, right eye: Secondary | ICD-10-CM | POA: Diagnosis not present

## 2017-03-21 DIAGNOSIS — H21501 Unspecified adhesions of iris, right eye: Secondary | ICD-10-CM | POA: Diagnosis not present

## 2017-03-21 DIAGNOSIS — T8522XA Displacement of intraocular lens, initial encounter: Secondary | ICD-10-CM | POA: Diagnosis not present

## 2017-04-01 ENCOUNTER — Encounter: Payer: Self-pay | Admitting: Vascular Surgery

## 2017-04-01 DIAGNOSIS — H35351 Cystoid macular degeneration, right eye: Secondary | ICD-10-CM | POA: Diagnosis not present

## 2017-04-01 DIAGNOSIS — Z961 Presence of intraocular lens: Secondary | ICD-10-CM | POA: Diagnosis not present

## 2017-04-01 DIAGNOSIS — H3582 Retinal ischemia: Secondary | ICD-10-CM | POA: Diagnosis not present

## 2017-04-11 ENCOUNTER — Ambulatory Visit: Payer: Medicare Other | Admitting: Vascular Surgery

## 2017-04-11 ENCOUNTER — Ambulatory Visit (HOSPITAL_COMMUNITY): Payer: Medicare Other

## 2017-04-17 DIAGNOSIS — I714 Abdominal aortic aneurysm, without rupture: Secondary | ICD-10-CM | POA: Diagnosis not present

## 2017-04-17 DIAGNOSIS — I482 Chronic atrial fibrillation: Secondary | ICD-10-CM | POA: Diagnosis not present

## 2017-04-17 DIAGNOSIS — M1990 Unspecified osteoarthritis, unspecified site: Secondary | ICD-10-CM | POA: Diagnosis not present

## 2017-04-17 DIAGNOSIS — N189 Chronic kidney disease, unspecified: Secondary | ICD-10-CM | POA: Diagnosis not present

## 2017-04-17 DIAGNOSIS — I495 Sick sinus syndrome: Secondary | ICD-10-CM | POA: Diagnosis not present

## 2017-04-17 DIAGNOSIS — I25118 Atherosclerotic heart disease of native coronary artery with other forms of angina pectoris: Secondary | ICD-10-CM | POA: Diagnosis not present

## 2017-04-17 DIAGNOSIS — E785 Hyperlipidemia, unspecified: Secondary | ICD-10-CM | POA: Diagnosis not present

## 2017-04-17 DIAGNOSIS — I1310 Hypertensive heart and chronic kidney disease without heart failure, with stage 1 through stage 4 chronic kidney disease, or unspecified chronic kidney disease: Secondary | ICD-10-CM | POA: Diagnosis not present

## 2017-04-18 ENCOUNTER — Ambulatory Visit: Payer: Medicare Other | Admitting: Vascular Surgery

## 2017-04-18 ENCOUNTER — Other Ambulatory Visit (HOSPITAL_COMMUNITY): Payer: Medicare Other

## 2017-04-29 DIAGNOSIS — H35351 Cystoid macular degeneration, right eye: Secondary | ICD-10-CM | POA: Diagnosis not present

## 2017-04-29 DIAGNOSIS — H21501 Unspecified adhesions of iris, right eye: Secondary | ICD-10-CM | POA: Diagnosis not present

## 2017-04-29 DIAGNOSIS — T8522XD Displacement of intraocular lens, subsequent encounter: Secondary | ICD-10-CM | POA: Diagnosis not present

## 2017-04-29 DIAGNOSIS — H59811 Chorioretinal scars after surgery for detachment, right eye: Secondary | ICD-10-CM | POA: Diagnosis not present

## 2017-04-29 DIAGNOSIS — I10 Essential (primary) hypertension: Secondary | ICD-10-CM | POA: Diagnosis not present

## 2017-04-29 DIAGNOSIS — H43392 Other vitreous opacities, left eye: Secondary | ICD-10-CM | POA: Diagnosis not present

## 2017-04-29 DIAGNOSIS — E785 Hyperlipidemia, unspecified: Secondary | ICD-10-CM | POA: Diagnosis not present

## 2017-04-29 DIAGNOSIS — H21511 Anterior synechiae (iris), right eye: Secondary | ICD-10-CM | POA: Diagnosis not present

## 2017-04-29 DIAGNOSIS — I2510 Atherosclerotic heart disease of native coronary artery without angina pectoris: Secondary | ICD-10-CM | POA: Diagnosis not present

## 2017-04-29 DIAGNOSIS — H3582 Retinal ischemia: Secondary | ICD-10-CM | POA: Diagnosis not present

## 2017-04-29 DIAGNOSIS — H348322 Tributary (branch) retinal vein occlusion, left eye, stable: Secondary | ICD-10-CM | POA: Diagnosis not present

## 2017-04-29 DIAGNOSIS — H35371 Puckering of macula, right eye: Secondary | ICD-10-CM | POA: Diagnosis not present

## 2017-05-27 ENCOUNTER — Encounter: Payer: Self-pay | Admitting: Vascular Surgery

## 2017-05-30 ENCOUNTER — Encounter: Payer: Self-pay | Admitting: Vascular Surgery

## 2017-05-30 ENCOUNTER — Ambulatory Visit (HOSPITAL_COMMUNITY)
Admission: RE | Admit: 2017-05-30 | Discharge: 2017-05-30 | Disposition: A | Discharge status: Home / Self Care | Payer: Medicare Other | Source: Ambulatory Visit | Attending: Vascular Surgery | Admitting: Vascular Surgery

## 2017-05-30 ENCOUNTER — Ambulatory Visit (INDEPENDENT_AMBULATORY_CARE_PROVIDER_SITE_OTHER): Payer: Medicare Other | Admitting: Vascular Surgery

## 2017-05-30 VITALS — BP 122/78 | HR 64 | Temp 97.4°F | Resp 20 | Ht 73.0 in | Wt 166.1 lb

## 2017-05-30 DIAGNOSIS — I714 Abdominal aortic aneurysm, without rupture, unspecified: Secondary | ICD-10-CM

## 2017-05-30 NOTE — Progress Notes (Signed)
Patient is a 83 year old male who returns for follow-up today regarding abdominal aortic aneurysm. He has had a known aortic aneurysm dating back to 2013. At that time CT scan showed the aneurysm was 4.3 cm in diameter. He continues to slow down activity wise. His day consists mainly of moving around the house some with his walker and sitting. His meals are provided and he is dressed by an aide. He still until he is quite sharp. He does have a post-of chronic aches and pains including back pain.  Review of systems: Can become short of breath with minimal activity. He denies chest pain.  Physical exam:  Vitals:   05/30/17 0838  BP: 122/78  Pulse: 64  Resp: 20  Temp: 97.4 F (36.3 C)  TempSrc: Oral  SpO2: 97%  Weight: 166 lb 1.6 oz (75.3 kg)  Height: 6\' 1"  (1.854 m)    Abdomen: Soft nontender nondistended vaguely palpable pulsatile mass  Extremities: 2+ femoral pulses bilaterally  Gen.: Alert and oriented 3 no acute distress  Data: Patient had an abdominal aortic ultrasound today which shows aneurysm diameter is 4.8 cm compared to 4.4 cm March 2017.  Assessment: Asymptomatic abdominal aortic aneurysm slight increase in size compared to 1 year ago. Patient overall is mentally acute but physically in decline with minimal activity during the day. I discussed with the patient and his family that we would consider repair if the aneurysm gets to 5-1/2 cm in diameter. Otherwise we will continue ultrasound surveillance. He will follow-up in 6 months time with a repeat ultrasound of his aorta. Signs symptoms of aortic rupture including back and abdominal pain were discussed the patient and his family today.  Plan: See above  Ruta Hinds, MD [facility] Office: [phone] Pager: [phone]

## 2017-06-05 DIAGNOSIS — H43311 Vitreous membranes and strands, right eye: Secondary | ICD-10-CM | POA: Diagnosis not present

## 2017-06-05 DIAGNOSIS — H21511 Anterior synechiae (iris), right eye: Secondary | ICD-10-CM | POA: Diagnosis not present

## 2017-06-05 DIAGNOSIS — H4301 Vitreous prolapse, right eye: Secondary | ICD-10-CM | POA: Diagnosis not present

## 2017-06-14 NOTE — Addendum Note (Signed)
Addended by: Lianne Cure A on: 06/14/2017 09:48 AM   Modules accepted: Orders

## 2017-07-05 DIAGNOSIS — I1310 Hypertensive heart and chronic kidney disease without heart failure, with stage 1 through stage 4 chronic kidney disease, or unspecified chronic kidney disease: Secondary | ICD-10-CM | POA: Diagnosis not present

## 2017-07-05 DIAGNOSIS — I25118 Atherosclerotic heart disease of native coronary artery with other forms of angina pectoris: Secondary | ICD-10-CM | POA: Diagnosis not present

## 2017-07-05 DIAGNOSIS — N189 Chronic kidney disease, unspecified: Secondary | ICD-10-CM | POA: Diagnosis not present

## 2017-07-05 DIAGNOSIS — I714 Abdominal aortic aneurysm, without rupture: Secondary | ICD-10-CM | POA: Diagnosis not present

## 2017-07-05 DIAGNOSIS — I495 Sick sinus syndrome: Secondary | ICD-10-CM | POA: Diagnosis not present

## 2017-07-05 DIAGNOSIS — M1990 Unspecified osteoarthritis, unspecified site: Secondary | ICD-10-CM | POA: Diagnosis not present

## 2017-07-05 DIAGNOSIS — D170 Benign lipomatous neoplasm of skin and subcutaneous tissue of head, face and neck: Secondary | ICD-10-CM | POA: Diagnosis not present

## 2017-07-05 DIAGNOSIS — E785 Hyperlipidemia, unspecified: Secondary | ICD-10-CM | POA: Diagnosis not present

## 2017-07-05 DIAGNOSIS — I482 Chronic atrial fibrillation: Secondary | ICD-10-CM | POA: Diagnosis not present

## 2017-09-16 DIAGNOSIS — H348122 Central retinal vein occlusion, left eye, stable: Secondary | ICD-10-CM | POA: Diagnosis not present

## 2017-09-16 DIAGNOSIS — H59811 Chorioretinal scars after surgery for detachment, right eye: Secondary | ICD-10-CM | POA: Diagnosis not present

## 2017-09-16 DIAGNOSIS — Z961 Presence of intraocular lens: Secondary | ICD-10-CM | POA: Diagnosis not present

## 2017-09-16 DIAGNOSIS — H3582 Retinal ischemia: Secondary | ICD-10-CM | POA: Diagnosis not present

## 2017-09-16 DIAGNOSIS — H43812 Vitreous degeneration, left eye: Secondary | ICD-10-CM | POA: Diagnosis not present

## 2017-09-16 DIAGNOSIS — T8522XD Displacement of intraocular lens, subsequent encounter: Secondary | ICD-10-CM | POA: Diagnosis not present

## 2017-09-16 DIAGNOSIS — H25812 Combined forms of age-related cataract, left eye: Secondary | ICD-10-CM | POA: Diagnosis not present

## 2017-09-16 DIAGNOSIS — H43392 Other vitreous opacities, left eye: Secondary | ICD-10-CM | POA: Diagnosis not present

## 2017-10-09 DIAGNOSIS — E785 Hyperlipidemia, unspecified: Secondary | ICD-10-CM | POA: Diagnosis not present

## 2017-10-09 DIAGNOSIS — I25118 Atherosclerotic heart disease of native coronary artery with other forms of angina pectoris: Secondary | ICD-10-CM | POA: Diagnosis not present

## 2017-10-09 DIAGNOSIS — M1990 Unspecified osteoarthritis, unspecified site: Secondary | ICD-10-CM | POA: Diagnosis not present

## 2017-10-09 DIAGNOSIS — D170 Benign lipomatous neoplasm of skin and subcutaneous tissue of head, face and neck: Secondary | ICD-10-CM | POA: Diagnosis not present

## 2017-10-09 DIAGNOSIS — I1310 Hypertensive heart and chronic kidney disease without heart failure, with stage 1 through stage 4 chronic kidney disease, or unspecified chronic kidney disease: Secondary | ICD-10-CM | POA: Diagnosis not present

## 2017-10-09 DIAGNOSIS — I482 Chronic atrial fibrillation: Secondary | ICD-10-CM | POA: Diagnosis not present

## 2017-10-09 DIAGNOSIS — I714 Abdominal aortic aneurysm, without rupture: Secondary | ICD-10-CM | POA: Diagnosis not present

## 2017-10-09 DIAGNOSIS — I495 Sick sinus syndrome: Secondary | ICD-10-CM | POA: Diagnosis not present

## 2017-10-09 DIAGNOSIS — N189 Chronic kidney disease, unspecified: Secondary | ICD-10-CM | POA: Diagnosis not present

## 2017-12-05 ENCOUNTER — Ambulatory Visit: Payer: Medicare Other | Admitting: Vascular Surgery

## 2017-12-05 ENCOUNTER — Other Ambulatory Visit (HOSPITAL_COMMUNITY): Payer: Medicare Other

## 2018-01-08 DIAGNOSIS — I714 Abdominal aortic aneurysm, without rupture: Secondary | ICD-10-CM | POA: Diagnosis not present

## 2018-01-08 DIAGNOSIS — I495 Sick sinus syndrome: Secondary | ICD-10-CM | POA: Diagnosis not present

## 2018-01-08 DIAGNOSIS — K219 Gastro-esophageal reflux disease without esophagitis: Secondary | ICD-10-CM | POA: Diagnosis not present

## 2018-01-08 DIAGNOSIS — I5020 Unspecified systolic (congestive) heart failure: Secondary | ICD-10-CM | POA: Diagnosis not present

## 2018-01-08 DIAGNOSIS — M1990 Unspecified osteoarthritis, unspecified site: Secondary | ICD-10-CM | POA: Diagnosis not present

## 2018-01-08 DIAGNOSIS — E785 Hyperlipidemia, unspecified: Secondary | ICD-10-CM | POA: Diagnosis not present

## 2018-01-08 DIAGNOSIS — I1310 Hypertensive heart and chronic kidney disease without heart failure, with stage 1 through stage 4 chronic kidney disease, or unspecified chronic kidney disease: Secondary | ICD-10-CM | POA: Diagnosis not present

## 2018-01-08 DIAGNOSIS — I482 Chronic atrial fibrillation: Secondary | ICD-10-CM | POA: Diagnosis not present

## 2018-01-08 DIAGNOSIS — I25118 Atherosclerotic heart disease of native coronary artery with other forms of angina pectoris: Secondary | ICD-10-CM | POA: Diagnosis not present

## 2018-01-08 DIAGNOSIS — N189 Chronic kidney disease, unspecified: Secondary | ICD-10-CM | POA: Diagnosis not present

## 2018-01-23 ENCOUNTER — Ambulatory Visit (INDEPENDENT_AMBULATORY_CARE_PROVIDER_SITE_OTHER): Payer: Medicare Other | Admitting: Vascular Surgery

## 2018-01-23 ENCOUNTER — Ambulatory Visit (HOSPITAL_COMMUNITY)
Admission: RE | Admit: 2018-01-23 | Discharge: 2018-01-23 | Disposition: A | Discharge status: Home / Self Care | Payer: Medicare Other | Source: Ambulatory Visit | Attending: Vascular Surgery | Admitting: Vascular Surgery

## 2018-01-23 ENCOUNTER — Encounter: Payer: Self-pay | Admitting: Vascular Surgery

## 2018-01-23 VITALS — BP 114/77 | HR 62 | Temp 97.0°F | Resp 16 | Ht 73.0 in | Wt 165.0 lb

## 2018-01-23 DIAGNOSIS — I714 Abdominal aortic aneurysm, without rupture, unspecified: Secondary | ICD-10-CM

## 2018-01-23 NOTE — Progress Notes (Signed)
Patient is a 84 year old male who returns for follow-up today for abdominal aortic aneurysm.  We have been following his aneurysm since 2013.  At that time the CT scan showed the aneurysm was 4.3 cm in diameter.  His daily activity still continues to remain mainly getting around the house some with his walker.  He is able to dress and clean himself but has an aide that helps out with activities around the house and meals.  He has chronic back pain.  Review of systems: Shortness of breath with minimal activity.  He denies chest pain.  Physical exam:  Vitals:   01/23/18 0951  BP: 114/77  Pulse: 62  Resp: 16  Temp: (!) 97 F (36.1 C)  TempSrc: Oral  SpO2: 100%  Weight: 165 lb (74.8 kg)  Height: 6\' 1"  (1.854 m)    Abdomen: Soft nontender nondistended vaguely palpable pulsatile mass  Extremities: 2+ femoral pulses bilaterally  Chest: Clear to auscultation bilaterally  Cardiac: Regular rate and rhythm  Data: Patient had an aortic ultrasound today which shows the aortic diameter is 4.7 cm.  This is similar to his last duplex in June 2018.  Assessment: Asymptomatic abdominal aortic aneurysm 4.7 cm diameter.  We would consider repair if it reaches 5-5-1/2 cm in diameter.  Plan: Follow-up ultrasound abdominal aorta 6 months  Ruta Hinds, MD [facility] Office: [phone] Pager: [phone]

## 2018-02-04 NOTE — Addendum Note (Signed)
Addended by: Lianne Cure A on: 02/04/2018 04:37 PM   Modules accepted: Orders

## 2018-03-24 DIAGNOSIS — H348322 Tributary (branch) retinal vein occlusion, left eye, stable: Secondary | ICD-10-CM | POA: Diagnosis not present

## 2018-03-24 DIAGNOSIS — H338 Other retinal detachments: Secondary | ICD-10-CM | POA: Diagnosis not present

## 2018-03-24 DIAGNOSIS — H25812 Combined forms of age-related cataract, left eye: Secondary | ICD-10-CM | POA: Diagnosis not present

## 2018-03-24 DIAGNOSIS — Z961 Presence of intraocular lens: Secondary | ICD-10-CM | POA: Diagnosis not present

## 2018-03-24 DIAGNOSIS — H31093 Other chorioretinal scars, bilateral: Secondary | ICD-10-CM | POA: Diagnosis not present

## 2018-04-09 DIAGNOSIS — I495 Sick sinus syndrome: Secondary | ICD-10-CM | POA: Diagnosis not present

## 2018-04-09 DIAGNOSIS — M1990 Unspecified osteoarthritis, unspecified site: Secondary | ICD-10-CM | POA: Diagnosis not present

## 2018-04-09 DIAGNOSIS — I1310 Hypertensive heart and chronic kidney disease without heart failure, with stage 1 through stage 4 chronic kidney disease, or unspecified chronic kidney disease: Secondary | ICD-10-CM | POA: Diagnosis not present

## 2018-04-09 DIAGNOSIS — I714 Abdominal aortic aneurysm, without rupture: Secondary | ICD-10-CM | POA: Diagnosis not present

## 2018-04-09 DIAGNOSIS — I25118 Atherosclerotic heart disease of native coronary artery with other forms of angina pectoris: Secondary | ICD-10-CM | POA: Diagnosis not present

## 2018-04-09 DIAGNOSIS — N189 Chronic kidney disease, unspecified: Secondary | ICD-10-CM | POA: Diagnosis not present

## 2018-04-09 DIAGNOSIS — E785 Hyperlipidemia, unspecified: Secondary | ICD-10-CM | POA: Diagnosis not present

## 2018-04-09 DIAGNOSIS — I5020 Unspecified systolic (congestive) heart failure: Secondary | ICD-10-CM | POA: Diagnosis not present

## 2018-04-09 DIAGNOSIS — K219 Gastro-esophageal reflux disease without esophagitis: Secondary | ICD-10-CM | POA: Diagnosis not present

## 2018-04-09 DIAGNOSIS — I4891 Unspecified atrial fibrillation: Secondary | ICD-10-CM | POA: Diagnosis not present

## 2018-05-05 DIAGNOSIS — E785 Hyperlipidemia, unspecified: Secondary | ICD-10-CM | POA: Diagnosis not present

## 2018-05-05 DIAGNOSIS — K219 Gastro-esophageal reflux disease without esophagitis: Secondary | ICD-10-CM | POA: Diagnosis not present

## 2018-05-05 DIAGNOSIS — M1990 Unspecified osteoarthritis, unspecified site: Secondary | ICD-10-CM | POA: Diagnosis not present

## 2018-05-05 DIAGNOSIS — N189 Chronic kidney disease, unspecified: Secondary | ICD-10-CM | POA: Diagnosis not present

## 2018-05-05 DIAGNOSIS — I714 Abdominal aortic aneurysm, without rupture: Secondary | ICD-10-CM | POA: Diagnosis not present

## 2018-05-05 DIAGNOSIS — I5020 Unspecified systolic (congestive) heart failure: Secondary | ICD-10-CM | POA: Diagnosis not present

## 2018-05-05 DIAGNOSIS — I25118 Atherosclerotic heart disease of native coronary artery with other forms of angina pectoris: Secondary | ICD-10-CM | POA: Diagnosis not present

## 2018-05-05 DIAGNOSIS — I739 Peripheral vascular disease, unspecified: Secondary | ICD-10-CM | POA: Diagnosis not present

## 2018-05-05 DIAGNOSIS — I1310 Hypertensive heart and chronic kidney disease without heart failure, with stage 1 through stage 4 chronic kidney disease, or unspecified chronic kidney disease: Secondary | ICD-10-CM | POA: Diagnosis not present

## 2018-05-05 DIAGNOSIS — I4891 Unspecified atrial fibrillation: Secondary | ICD-10-CM | POA: Diagnosis not present

## 2018-05-05 DIAGNOSIS — I495 Sick sinus syndrome: Secondary | ICD-10-CM | POA: Diagnosis not present

## 2018-07-07 DIAGNOSIS — E785 Hyperlipidemia, unspecified: Secondary | ICD-10-CM | POA: Diagnosis not present

## 2018-07-07 DIAGNOSIS — R7309 Other abnormal glucose: Secondary | ICD-10-CM | POA: Diagnosis not present

## 2018-07-07 DIAGNOSIS — I10 Essential (primary) hypertension: Secondary | ICD-10-CM | POA: Diagnosis not present

## 2018-07-24 ENCOUNTER — Encounter: Payer: Self-pay | Admitting: Family

## 2018-07-24 ENCOUNTER — Ambulatory Visit (INDEPENDENT_AMBULATORY_CARE_PROVIDER_SITE_OTHER): Payer: Medicare Other | Admitting: Family

## 2018-07-24 ENCOUNTER — Ambulatory Visit (HOSPITAL_COMMUNITY)
Admission: RE | Admit: 2018-07-24 | Discharge: 2018-07-24 | Disposition: A | Discharge status: Home / Self Care | Payer: Medicare Other | Source: Ambulatory Visit | Attending: Family | Admitting: Family

## 2018-07-24 ENCOUNTER — Other Ambulatory Visit: Payer: Self-pay

## 2018-07-24 VITALS — BP 135/83 | HR 72 | Temp 96.9°F | Resp 16 | Ht 73.0 in | Wt 159.0 lb

## 2018-07-24 DIAGNOSIS — I714 Abdominal aortic aneurysm, without rupture, unspecified: Secondary | ICD-10-CM

## 2018-07-24 DIAGNOSIS — R0989 Other specified symptoms and signs involving the circulatory and respiratory systems: Secondary | ICD-10-CM

## 2018-07-24 NOTE — Patient Instructions (Addendum)
Before your next abdominal ultrasound:  Avoid gas forming foods and beverages the day before the test.   Take two Extra-Strength Gas-X capsules at bedtime the night before the test. Take another two Extra-Strength Gas-X capsules in the middle of the night if you get up to the restroom, if not, first thing in the morning with water.  Do not chew gum.      Abdominal Aortic Aneurysm Blood pumps away from the heart through tubes (blood vessels) called arteries. Aneurysms are weak or damaged places in the wall of an artery. It bulges out like a balloon. An abdominal aortic aneurysm happens in the main artery of the body (aorta). It can burst or tear, causing bleeding inside the body. This is an emergency. It needs treatment right away. What are the causes? The exact cause is unknown. Things that could cause this problem include:  Fat and other substances building up in the lining of a tube.  Swelling of the walls of a blood vessel.  Certain tissue diseases.  Belly (abdominal) trauma.  An infection in the main artery of the body.  What increases the risk? There are things that make it more likely for you to have an aneurysm. These include:  Being over the age of 82 years old.  Having high blood pressure (hypertension).  Being a male.  Being white.  Being very overweight (obese).  Having a family history of aneurysm.  Using tobacco products.  What are the signs or symptoms? Symptoms depend on the size of the aneurysm and how fast it grows. There may not be symptoms. If symptoms occur, they can include:  Pain (belly, side, lower back, or groin).  Feeling full after eating a small amount of food.  Feeling sick to your stomach (nauseous), throwing up (vomiting), or both.  Feeling a lump in your belly that feels like it is beating (pulsating).  Feeling like you will pass out (faint).  How is this treated?  Medicine to control blood pressure and pain.  Imaging tests to  see if the aneurysm gets bigger.  Surgery. How is this prevented? To lessen your chance of getting this condition:  Stop smoking. Stop chewing tobacco.  Limit or avoid alcohol.  Keep your blood pressure, blood sugar, and cholesterol within normal limits.  Eat less salt.  Eat foods low in saturated fats and cholesterol. These are found in animal and whole dairy products.  Eat more fiber. Fiber is found in whole grains, vegetables, and fruits.  Keep a healthy weight.  Stay active and exercise often.  This information is not intended to replace advice given to you by your health care provider. Make sure you discuss any questions you have with your health care provider. Document Released: 04/06/2013 Document Revised: 05/17/2016 Document Reviewed: 01/09/2013 Elsevier Interactive Patient Education  2017 [employer]  

## 2018-07-24 NOTE — Progress Notes (Signed)
[facility]    CC: Follow up Abdominal Aortic Aneurysm  History of Present Illness  [name] [name] is a 84 y.o. (07-Jun-1923) male whom Dr. Oneida Alar has monitored for abdominal aortic aneurysm.  We have been following his aneurysm since 2013.  At that time the CT scan showed the aneurysm was 4.3 cm in diameter.  His daily activity still continues to remain mainly getting around the house some with his walker.  He is able to dress and clean himself but has an aide that helps out with activities around the house and meals.  He has chronic back pain.  Review of systems: Shortness of breath with minimal activity.  He denies chest pain. He has been treated in the ED for reflux esophagitis in December 2017. Her continues to have GERD sx's.   Dr. Oneida Alar last evaluated pt on 01-23-18. At that time pt had an symptomatic abdominal aortic aneurysm measuring 4.7 cm diameter by duplex.  We would consider repair if it reaches 5-5-1/2 cm in diameter. Pt was to ollow-up ultrasound abdominal aorta 6 months.   He has atrial fib, has a pacemaker, takes Plavix, ASA, and a beta blocker.  Cardiology note indicates he has prior SDH and persistent gait unsteadiness. Therefore, primary cardiology does not feel that he is a candidate for [place].   The patient denies history of stroke or TIA symptoms.  Diabetic: No Tobacco use: former smoker, quit in 1996, smoked x 15 years    Past Medical History:  Diagnosis Date  . AAA (abdominal aortic aneurysm) without rupture ([place])    10/2012 CT scan: 4.3x4.6cm  . Arthritis    "legs are stiff" , had spinal injections for pain relieve 3-4 yrs. ago  . Atrial fibrillation-permanent   . Bradycardia   . Cataract   . CHF (congestive heart failure) ([place])   . Constipation   . Depression   . Diverticulosis   . GERD (gastroesophageal reflux disease)   . Hypertension   . Nonischemic/ ischemic cardiomyopathy    Single-vessel coronary disease  catheterization 2004-Myoview showing inferior wall perfusion defect Myoview 2006  . Recurrent upper respiratory infection (URI)    recent cold- treating /w OTC  . Subdural hematoma (HCC)    Prior evacuation   Past Surgical History:  Procedure Laterality Date  . BRAIN SURGERY     2002  . CATARACT EXTRACTION W/PHACO  01/30/2012   Procedure: CATARACT EXTRACTION PHACO AND INTRAOCULAR LENS PLACEMENT (IOC);  Surgeon: Adonis Brook, MD;  Location: [place];  Service: Ophthalmology;  Laterality: Right;  . evacuation of subdural hematoma    . EYE SURGERY     cataract extracted in L eye  . GAS/FLUID EXCHANGE  07/30/2012   Procedure: GAS/FLUID EXCHANGE;  Surgeon: Adonis Brook, MD;  Location: [place];  Service: Ophthalmology;  Laterality: Right;  . HERNIA REPAIR     R side inguinal repair   . PACEMAKER INSERTION  08/26/12   SJM Accent SR RF pacemaker  . PARS PLANA VITRECTOMY  07/30/2012   Procedure: PARS PLANA VITRECTOMY WITH 23 GAUGE;  Surgeon: Adonis Brook, MD;  Location: [place];  Service: Ophthalmology;  Laterality: Right;  Insertion of Silicone Oil  . PERMANENT PACEMAKER INSERTION N/A 08/26/2012   Procedure: PERMANENT PACEMAKER INSERTION;  Surgeon: Thompson Grayer, MD;  Location: [facility] CATH LAB;  Service: Cardiovascular;  Laterality: N/A;  . TONSILLECTOMY     1957   Social History Social History   Socioeconomic History  . Marital status: Widowed  Spouse name: Not on file  . Number of children: Not on file  . Years of education: Not on file  . Highest education level: Not on file  Occupational History  . Not on file  Social Needs  . Financial resource strain: Not on file  . Food insecurity:    Worry: Not on file    Inability: Not on file  . Transportation needs:    Medical: Not on file    Non-medical: Not on file  Tobacco Use  . Smoking status: Former Smoker    Packs/day: 0.25    Years: 15.00    Pack years: 3.75    Last attempt to quit: 12/24/1994    Years since quitting: 23.5  . Smokeless  tobacco: Never Used  Substance and Sexual Activity  . Alcohol use: No  . Drug use: No  . Sexual activity: Not on file  Lifestyle  . Physical activity:    Days per week: Not on file    Minutes per session: Not on file  . Stress: Not on file  Relationships  . Social connections:    Talks on phone: Not on file    Gets together: Not on file    Attends religious service: Not on file    Active member of club or organization: Not on file    Attends meetings of clubs or organizations: Not on file    Relationship status: Not on file  . Intimate partner violence:    Fear of current or ex partner: Not on file    Emotionally abused: Not on file    Physically abused: Not on file    Forced sexual activity: Not on file  Other Topics Concern  . Not on file  Social History Narrative  . Not on file   Family History Family History  Problem Relation Age of Onset  . Anesthesia problems Neg Hx   . Hypotension Neg Hx   . Malignant hyperthermia Neg Hx   . Pseudochol deficiency Neg Hx     Current Outpatient Medications on File Prior to Visit  Medication Sig Dispense Refill  . alum & mag hydroxide-simeth (MAALOX REGULAR STRENGTH) 200-200-20 MG/5ML suspension Take 15 mLs by mouth every 6 (six) hours as needed for indigestion or heartburn. 355 mL 0  . aspirin EC 81 MG tablet Take 81 mg by mouth daily.    . carvedilol (COREG) 3.125 MG tablet Take 1 tablet (3.125 mg total) by mouth 2 (two) times daily with a meal. 60 tablet 3  . clopidogrel (PLAVIX) 75 MG tablet Take 75 mg by mouth. Pt taking half of 75 mg daily    . docusate sodium (COLACE) 100 MG capsule Take 1 capsule (100 mg total) by mouth daily as needed for mild constipation. 10 capsule 0  . pantoprazole (PROTONIX) 40 MG tablet Take 40 mg by mouth 2 (two) times daily.    . Probiotic Product (PROBIOTIC DAILY PO) Take 1 capsule by mouth daily. Reported on 03/28/2016    . Pseudoeph-Doxylamine-DM-APAP (NYQUIL PO) Take 30 mLs by mouth at bedtime as  needed (sleep). Reported on 03/28/2016    . ramipril (ALTACE) 1.25 MG capsule Take 1 capsule (1.25 mg total) by mouth daily. 30 capsule 3   No current facility-administered medications on file prior to visit.    No Known Allergies  ROS: See HPI for pertinent positives and negatives.  Physical Examination  Vitals:   07/24/18 0835  BP: 135/83  Pulse: 72  Resp: 16  Temp: Marland Kitchen)  96.9 F (36.1 C)  TempSrc: Oral  SpO2: 97%  Weight: 159 lb (72.1 kg)  Height: 6\' 1"  (1.854 m)   Body mass index is 20.98 kg/m.  General: A&O x 2 (not oriented to year or season), WD, elderly male. Gait: using cane, slow HEENT: Grossly intact and WNL.  Pulmonary: Sym exp, respirations are non labored, good air movt, CTAB, no rales, rhonchi, or wheezing. Cardiac: Regular rhythm and rate, no detected murmur. Pacemaker palpated left upper chest.  Carotid Bruits Right Left   Negative Negative   Adominal aortic pulse is mildly palpable Radial pulses are 2+ palpable                          VASCULAR EXAM:                                                                                                         LE Pulses Right Left       FEMORAL  2+ palpable  2+ palpable        POPLITEAL  3+ palpable   2+ palpable       POSTERIOR TIBIAL  1+ palpable   not palpable        DORSALIS PEDIS      ANTERIOR TIBIAL 2+ palpable  2+ palpable     Gastrointestinal: soft, NTND, -G/R, - HSM, - masses palpated, - CVAT B. Musculoskeletal: M/S 4/5 throughout, Extremities without ischemic changes. No peripheral edema.  Skin: No rashes, no ulcers, no cellulitis.   Neurologic: CN 2-12 intact, Pain and light touch intact in extremities are intact, Motor exam as listed above. Psychiatric: Normal thought content, mood appropriate to clinical situation.    DATA  AAA Duplex (07/24/2018):  Previous size: 4.7 cm (Date: 01-23-18)  Current size:  4.8 cm (Date: 07-24-18); Right CIA: 2.0 cm; Left CIA: 2.0 cm. Limited visualization  due to overlying bowel gas.   Medical Decision Making  The patient is a 84 y.o. male who presents with asymptomatic AAA with stable size at 4.8 cm from 4.7 cm, based on limited visualization.   Based on this patient's exam and diagnostic studies, the patient will follow up in 4 months with the following studies: AAA duplex and bilateral popliteal artery duplex (prominent bilateral popliteal pulses).   Consideration for repair of AAA would be made when the size is 5.0 cm, growth > 1 cm/yr, and symptomatic status.        The patient was given information about AAA including signs, symptoms, treatment, and how to minimize the risk of enlargement and rupture of aneurysms.    I emphasized the importance of maximal medical management including strict control of blood pressure, blood glucose, and lipid levels, antiplatelet agents, obtaining regular exercise, and continued cessation of smoking.   The patient was advised to call 911 should the patient experience sudden onset abdominal or back pain.   Thank you for allowing [place] to participate in this patient's care.  Vinnie Level Makari Portman, RN, MSN, FNP-C [facility] [place]  Office: [phone]  Clinic Physician: Oneida Alar  07/24/2018, 8:42 AM

## 2018-07-31 ENCOUNTER — Other Ambulatory Visit: Payer: Self-pay

## 2018-07-31 DIAGNOSIS — I714 Abdominal aortic aneurysm, without rupture, unspecified: Secondary | ICD-10-CM

## 2018-07-31 DIAGNOSIS — R0989 Other specified symptoms and signs involving the circulatory and respiratory systems: Secondary | ICD-10-CM

## 2018-08-04 DIAGNOSIS — I495 Sick sinus syndrome: Secondary | ICD-10-CM | POA: Diagnosis not present

## 2018-08-04 DIAGNOSIS — N189 Chronic kidney disease, unspecified: Secondary | ICD-10-CM | POA: Diagnosis not present

## 2018-08-04 DIAGNOSIS — I739 Peripheral vascular disease, unspecified: Secondary | ICD-10-CM | POA: Diagnosis not present

## 2018-08-04 DIAGNOSIS — I4891 Unspecified atrial fibrillation: Secondary | ICD-10-CM | POA: Diagnosis not present

## 2018-08-04 DIAGNOSIS — I714 Abdominal aortic aneurysm, without rupture: Secondary | ICD-10-CM | POA: Diagnosis not present

## 2018-08-04 DIAGNOSIS — I25118 Atherosclerotic heart disease of native coronary artery with other forms of angina pectoris: Secondary | ICD-10-CM | POA: Diagnosis not present

## 2018-08-04 DIAGNOSIS — E785 Hyperlipidemia, unspecified: Secondary | ICD-10-CM | POA: Diagnosis not present

## 2018-08-04 DIAGNOSIS — M1990 Unspecified osteoarthritis, unspecified site: Secondary | ICD-10-CM | POA: Diagnosis not present

## 2018-08-04 DIAGNOSIS — I5020 Unspecified systolic (congestive) heart failure: Secondary | ICD-10-CM | POA: Diagnosis not present

## 2018-08-04 DIAGNOSIS — K219 Gastro-esophageal reflux disease without esophagitis: Secondary | ICD-10-CM | POA: Diagnosis not present

## 2018-08-04 DIAGNOSIS — I10 Essential (primary) hypertension: Secondary | ICD-10-CM | POA: Diagnosis not present

## 2018-09-23 DIAGNOSIS — H25812 Combined forms of age-related cataract, left eye: Secondary | ICD-10-CM | POA: Diagnosis not present

## 2018-09-23 DIAGNOSIS — H338 Other retinal detachments: Secondary | ICD-10-CM | POA: Diagnosis not present

## 2018-09-23 DIAGNOSIS — Z961 Presence of intraocular lens: Secondary | ICD-10-CM | POA: Diagnosis not present

## 2018-09-23 DIAGNOSIS — H43311 Vitreous membranes and strands, right eye: Secondary | ICD-10-CM | POA: Diagnosis not present

## 2018-09-23 DIAGNOSIS — H31092 Other chorioretinal scars, left eye: Secondary | ICD-10-CM | POA: Diagnosis not present

## 2018-09-23 DIAGNOSIS — H3582 Retinal ischemia: Secondary | ICD-10-CM | POA: Diagnosis not present

## 2018-09-23 DIAGNOSIS — H348322 Tributary (branch) retinal vein occlusion, left eye, stable: Secondary | ICD-10-CM | POA: Diagnosis not present

## 2018-09-23 DIAGNOSIS — H59811 Chorioretinal scars after surgery for detachment, right eye: Secondary | ICD-10-CM | POA: Diagnosis not present

## 2018-10-27 DIAGNOSIS — I495 Sick sinus syndrome: Secondary | ICD-10-CM | POA: Diagnosis not present

## 2018-10-27 DIAGNOSIS — K219 Gastro-esophageal reflux disease without esophagitis: Secondary | ICD-10-CM | POA: Diagnosis not present

## 2018-10-27 DIAGNOSIS — N189 Chronic kidney disease, unspecified: Secondary | ICD-10-CM | POA: Diagnosis not present

## 2018-10-27 DIAGNOSIS — E785 Hyperlipidemia, unspecified: Secondary | ICD-10-CM | POA: Diagnosis not present

## 2018-10-27 DIAGNOSIS — I739 Peripheral vascular disease, unspecified: Secondary | ICD-10-CM | POA: Diagnosis not present

## 2018-10-27 DIAGNOSIS — I25118 Atherosclerotic heart disease of native coronary artery with other forms of angina pectoris: Secondary | ICD-10-CM | POA: Diagnosis not present

## 2018-10-27 DIAGNOSIS — Z950 Presence of cardiac pacemaker: Secondary | ICD-10-CM | POA: Diagnosis not present

## 2018-10-27 DIAGNOSIS — J029 Acute pharyngitis, unspecified: Secondary | ICD-10-CM | POA: Diagnosis not present

## 2018-10-27 DIAGNOSIS — I4891 Unspecified atrial fibrillation: Secondary | ICD-10-CM | POA: Diagnosis not present

## 2018-10-27 DIAGNOSIS — F0390 Unspecified dementia without behavioral disturbance: Secondary | ICD-10-CM | POA: Diagnosis not present

## 2018-10-27 DIAGNOSIS — I10 Essential (primary) hypertension: Secondary | ICD-10-CM | POA: Diagnosis not present

## 2018-10-27 DIAGNOSIS — I5020 Unspecified systolic (congestive) heart failure: Secondary | ICD-10-CM | POA: Diagnosis not present

## 2018-11-27 DIAGNOSIS — Z13820 Encounter for screening for osteoporosis: Secondary | ICD-10-CM | POA: Diagnosis not present

## 2018-11-27 DIAGNOSIS — I5020 Unspecified systolic (congestive) heart failure: Secondary | ICD-10-CM | POA: Diagnosis not present

## 2018-11-27 DIAGNOSIS — I129 Hypertensive chronic kidney disease with stage 1 through stage 4 chronic kidney disease, or unspecified chronic kidney disease: Secondary | ICD-10-CM | POA: Diagnosis not present

## 2018-11-27 DIAGNOSIS — N183 Chronic kidney disease, stage 3 (moderate): Secondary | ICD-10-CM | POA: Diagnosis not present

## 2018-12-04 ENCOUNTER — Ambulatory Visit: Payer: Medicare Other | Admitting: Family

## 2018-12-04 ENCOUNTER — Other Ambulatory Visit (HOSPITAL_COMMUNITY): Payer: Medicare Other

## 2018-12-11 ENCOUNTER — Other Ambulatory Visit (HOSPITAL_COMMUNITY): Payer: Medicare Other

## 2018-12-11 ENCOUNTER — Ambulatory Visit: Payer: Medicare Other | Admitting: Family

## 2018-12-11 ENCOUNTER — Encounter (HOSPITAL_COMMUNITY): Payer: Medicare Other

## 2019-01-26 DIAGNOSIS — I25118 Atherosclerotic heart disease of native coronary artery with other forms of angina pectoris: Secondary | ICD-10-CM | POA: Diagnosis not present

## 2019-01-26 DIAGNOSIS — I10 Essential (primary) hypertension: Secondary | ICD-10-CM | POA: Diagnosis not present

## 2019-01-26 DIAGNOSIS — M1990 Unspecified osteoarthritis, unspecified site: Secondary | ICD-10-CM | POA: Diagnosis not present

## 2019-01-26 DIAGNOSIS — K219 Gastro-esophageal reflux disease without esophagitis: Secondary | ICD-10-CM | POA: Diagnosis not present

## 2019-01-26 DIAGNOSIS — I495 Sick sinus syndrome: Secondary | ICD-10-CM | POA: Diagnosis not present

## 2019-01-26 DIAGNOSIS — Z950 Presence of cardiac pacemaker: Secondary | ICD-10-CM | POA: Diagnosis not present

## 2019-01-26 DIAGNOSIS — J45909 Unspecified asthma, uncomplicated: Secondary | ICD-10-CM | POA: Diagnosis not present

## 2019-01-26 DIAGNOSIS — N189 Chronic kidney disease, unspecified: Secondary | ICD-10-CM | POA: Diagnosis not present

## 2019-01-26 DIAGNOSIS — F0390 Unspecified dementia without behavioral disturbance: Secondary | ICD-10-CM | POA: Diagnosis not present

## 2019-01-26 DIAGNOSIS — E785 Hyperlipidemia, unspecified: Secondary | ICD-10-CM | POA: Diagnosis not present

## 2019-01-26 DIAGNOSIS — I5020 Unspecified systolic (congestive) heart failure: Secondary | ICD-10-CM | POA: Diagnosis not present

## 2019-01-26 DIAGNOSIS — I739 Peripheral vascular disease, unspecified: Secondary | ICD-10-CM | POA: Diagnosis not present

## 2019-02-05 NOTE — Progress Notes (Signed)
HISTORY AND PHYSICAL     CC:  Follow up. Requesting Provider:  Charolette Forward, MD  HPI: This is a 85 y.o. male with hx of AAA that has been followed by Dr. Oneida Alar since 2013.  At that time, it measured 4.3cm.  At last visit in August, it measured 4.8cm.  He was able to perform his ADL's and walked with a walker.    He has a hx of afib, but given his unsteady gait, cardiology did not feel he was a good candidate for [facility]  He has hx of PPM.    He is here today accompanied by his daughter.  He continues to live alone in his apartment and has a nurse aide come for a couple hours a day.  He denies any abdominal pain or back pain.  He states that he tried to trim his right great toe nail as it is a little bloody.  His PCP has it in motion for him to go to podiatry for toe nail trims.    The pt is not on a statin for cholesterol management.  The pt is not diabetic.   The pt is on a BB and ACEI for hypertension.   Tobacco hx:  Remote-quit 1996 The pt is on a daily aspirin. Other AC:  Plavix   Past Medical History:  Diagnosis Date  . AAA (abdominal aortic aneurysm) without rupture ([place])    10/2012 CT scan: 4.3x4.6cm  . Arthritis    "legs are stiff" , had spinal injections for pain relieve 3-4 yrs. ago  . Atrial fibrillation-permanent   . Bradycardia   . Cataract   . CHF (congestive heart failure) ([place])   . Constipation   . Depression   . Diverticulosis   . GERD (gastroesophageal reflux disease)   . Hypertension   . Nonischemic/ ischemic cardiomyopathy    Single-vessel coronary disease catheterization 2004-Myoview showing inferior wall perfusion defect Myoview 2006  . Recurrent upper respiratory infection (URI)    recent cold- treating /w OTC  . Subdural hematoma (HCC)    Prior evacuation    Past Surgical History:  Procedure Laterality Date  . BRAIN SURGERY     2002  . CATARACT EXTRACTION W/PHACO  01/30/2012   Procedure: CATARACT EXTRACTION PHACO AND INTRAOCULAR LENS PLACEMENT  (IOC);  Surgeon: Adonis Brook, MD;  Location: [place];  Service: Ophthalmology;  Laterality: Right;  . evacuation of subdural hematoma    . EYE SURGERY     cataract extracted in L eye  . GAS/FLUID EXCHANGE  07/30/2012   Procedure: GAS/FLUID EXCHANGE;  Surgeon: Adonis Brook, MD;  Location: [place];  Service: Ophthalmology;  Laterality: Right;  . HERNIA REPAIR     R side inguinal repair   . PACEMAKER INSERTION  08/26/12   SJM Accent SR RF pacemaker  . PARS PLANA VITRECTOMY  07/30/2012   Procedure: PARS PLANA VITRECTOMY WITH 23 GAUGE;  Surgeon: Adonis Brook, MD;  Location: [place];  Service: Ophthalmology;  Laterality: Right;  Insertion of Silicone Oil  . PERMANENT PACEMAKER INSERTION N/A 08/26/2012   Procedure: PERMANENT PACEMAKER INSERTION;  Surgeon: Thompson Grayer, MD;  [facility] C[facility] LAB;  Service: Cardiovascular;  Laterality: N/A;  . TONSILLECTOMY     1957    No Known Allergies  Current Outpatient Medications  Medication Sig Dispense Refill  . alum & mag hydroxide-simeth (MAALOX REGULAR STRENGTH) 200-200-20 MG/5ML suspension Take 15 mLs by mouth every 6 (six) hours as needed for indigestion or heartburn. 355 mL 0  .  aspirin EC 81 MG tablet Take 81 mg by mouth daily.    . carvedilol (COREG) 3.125 MG tablet Take 1 tablet (3.125 mg total) by mouth 2 (two) times daily with a meal. 60 tablet 3  . clopidogrel (PLAVIX) 75 MG tablet Take 75 mg by mouth. Pt taking half of 75 mg daily    . docusate sodium (COLACE) 100 MG capsule Take 1 capsule (100 mg total) by mouth daily as needed for mild constipation. 10 capsule 0  . pantoprazole (PROTONIX) 40 MG tablet Take 40 mg by mouth 2 (two) times daily.    . Probiotic Product (PROBIOTIC DAILY PO) Take 1 capsule by mouth daily. Reported on 03/28/2016    . Pseudoeph-Doxylamine-DM-APAP (NYQUIL PO) Take 30 mLs by mouth at bedtime as needed (sleep). Reported on 03/28/2016    . ramipril (ALTACE) 1.25 MG capsule Take 1 capsule (1.25 mg total) by mouth daily. 30 capsule 3    No current facility-administered medications for this visit.     Family History  Problem Relation Age of Onset  . Anesthesia problems Neg Hx   . Hypotension Neg Hx   . Malignant hyperthermia Neg Hx   . Pseudochol deficiency Neg Hx     Social History   Socioeconomic History  . Marital status: Widowed    Spouse name: Not on file  . Number of children: Not on file  . Years of education: Not on file  . Highest education level: Not on file  Occupational History  . Not on file  Social Needs  . Financial resource strain: Not on file  . Food insecurity:    Worry: Not on file    Inability: Not on file  . Transportation needs:    Medical: Not on file    Non-medical: Not on file  Tobacco Use  . Smoking status: Former Smoker    Packs/day: 0.25    Years: 15.00    Pack years: 3.75    Last attempt to quit: 12/24/1994    Years since quitting: 24.1  . Smokeless tobacco: Never Used  Substance and Sexual Activity  . Alcohol use: No  . Drug use: No  . Sexual activity: Not on file  Lifestyle  . Physical activity:    Days per week: Not on file    Minutes per session: Not on file  . Stress: Not on file  Relationships  . Social connections:    Talks on phone: Not on file    Gets together: Not on file    Attends religious service: Not on file    Active member of club or organization: Not on file    Attends meetings of clubs or organizations: Not on file    Relationship status: Not on file  . Intimate partner violence:    Fear of current or ex partner: Not on file    Emotionally abused: Not on file    Physically abused: Not on file    Forced sexual activity: Not on file  Other Topics Concern  . Not on file  Social History Narrative  . Not on file     REVIEW OF SYSTEMS:   [X]  denotes positive finding, [ ]  denotes negative finding Cardiac  Comments:  Chest pain or chest pressure:    Shortness of breath upon exertion:    Short of breath when lying flat:    Irregular  heart rhythm:        Vascular    Pain in calf, thigh, or hip brought  on by ambulation:    Pain in feet at night that wakes you up from your sleep:  x   Blood clot in your veins:    Leg swelling:         Pulmonary    Oxygen at home:    Productive cough:     Wheezing:         Neurologic    Sudden weakness in arms or legs:     Sudden numbness in arms or legs:     Sudden onset of difficulty speaking or slurred speech:    Temporary loss of vision in one eye:     Problems with dizziness:         Gastrointestinal    Blood in stool:     Vomited blood:         Genitourinary    Burning when urinating:     Blood in urine:        Psychiatric    Major depression:         Hematologic    Bleeding problems:    Problems with blood clotting too easily:        Skin    Rashes or ulcers:        Constitutional    Fever or chills:      PHYSICAL EXAMINATION:  Today's Vitals   02/09/19 0909  BP: 119/77  Pulse: 70  Resp: 18  Temp: (!) 96.8 F (36 C)  TempSrc: Oral  SpO2: 98%  Weight: 149 lb 12.8 oz (67.9 kg)  Height: 6\' 1"  (1.854 m)   Body mass index is 19.76 kg/m.   General:  Thin male appears less than state age in NAD; vital signs documented above Gait: Not observed HENT: WNL, normocephalic Pulmonary: normal non-labored breathing , without Rales, rhonchi,  wheezing Cardiac: regular HR, without  Murmurs without carotid bruits Abdomen: soft, NT, no masses; aorta is palpable Skin: without rashes Vascular Exam/Pulses:  Right Left  Radial 2+ (normal) 2+ (normal)  Ulnar Unable to palpate  Unable to palpate   Femoral 2+ (normal) 2+ (normal)  Popliteal Unable to palpate  Unable to palpate   DP 2+ (normal) 2+ (normal)  PT Unable to palpate  Unable to palpate    Extremities: without ischemic changes, without Gangrene , without cellulitis; without open wounds; right great toenail missing with a little bloody residue.  Musculoskeletal: no muscle wasting or  atrophy  Neurologic: A&O X 3;  No focal weakness or paresthesias are detected Psychiatric:  The pt has Normal affect.   Non-Invasive Vascular Imaging:   AAA duplex 02/09/2019: Abdominal aorta:  Increase in size of distal aorta measuring 4.8 x 5.6cm (previous diameter 4.7 x4.8 cm.    Popliteal artery duplex 02/09/2019: Right:  0.72-1cm Left:  0.8-1.05cm  Previous AAA duplex 07/24/18: Abdominal Aorta: There is evidence of abnormal dilitation of the Distal Abdominal aorta. The largest aortic measurement is 4.7 x 4.8 cm. Previous diameter measurement was 4.4 x 4.7 cm obtained on 01/23/18.   ASSESSMENT/PLAN:: 85 y.o. male with hx of AAA   -his AAA has increased in size.  I had a long discussion with pt and his daughter about repair of his aorta.  Discussed that open repair is a major operation and given his age and fragility, he most likely would not be a candidate for this and his daughter did not want to consider this.  I also discussed proceeding with a CTA of abdomen/pelvis to determine if he is a candidate  for a stent graft and discussed that this is much less invasive.  He and his daughter want to discuss if they would like to proceed with this option.  They understand that repair with stent graft would also depend on his anatomy and we would not know this unless we get a CT scan.  They understand that he is at risk of rupture and this can be life threatening.  They still want to discuss prior to scheduling CT scan.  Discussed f/u in 3 months with u/s and see Dr. Oneida Alar.  If they want to proceed with CTA, she will call and schedule this. They also understand that if it ruptures and he develops severe back or abdominal pain, to call 911 and proceed to [facility].     Leontine Locket, PA-C [facility] [phone]  Clinic MD:   Trula Slade

## 2019-02-09 ENCOUNTER — Encounter: Payer: Self-pay | Admitting: Family

## 2019-02-09 ENCOUNTER — Ambulatory Visit (HOSPITAL_COMMUNITY)
Admission: RE | Admit: 2019-02-09 | Discharge: 2019-02-09 | Disposition: A | Discharge status: Home / Self Care | Payer: Medicare HMO | Source: Ambulatory Visit | Attending: Family | Admitting: Family

## 2019-02-09 ENCOUNTER — Ambulatory Visit (INDEPENDENT_AMBULATORY_CARE_PROVIDER_SITE_OTHER)
Admission: RE | Admit: 2019-02-09 | Discharge: 2019-02-09 | Disposition: A | Discharge status: Home / Self Care | Payer: Medicare HMO | Source: Ambulatory Visit | Attending: Family | Admitting: Family

## 2019-02-09 ENCOUNTER — Ambulatory Visit (INDEPENDENT_AMBULATORY_CARE_PROVIDER_SITE_OTHER): Payer: Medicare HMO | Admitting: Physician Assistant

## 2019-02-09 VITALS — BP 119/77 | HR 70 | Temp 96.8°F | Resp 18 | Ht 73.0 in | Wt 149.8 lb

## 2019-02-09 DIAGNOSIS — R0989 Other specified symptoms and signs involving the circulatory and respiratory systems: Secondary | ICD-10-CM

## 2019-02-09 DIAGNOSIS — I714 Abdominal aortic aneurysm, without rupture, unspecified: Secondary | ICD-10-CM

## 2019-04-22 ENCOUNTER — Other Ambulatory Visit: Payer: Self-pay

## 2019-04-22 DIAGNOSIS — I714 Abdominal aortic aneurysm, without rupture, unspecified: Secondary | ICD-10-CM

## 2019-05-14 ENCOUNTER — Ambulatory Visit: Payer: Medicare HMO | Admitting: Vascular Surgery

## 2019-05-14 ENCOUNTER — Ambulatory Visit (HOSPITAL_COMMUNITY): Admission: RE | Admit: 2019-05-14 | Payer: Medicare HMO | Source: Ambulatory Visit

## 2019-05-14 ENCOUNTER — Encounter: Payer: Self-pay | Admitting: Family

## 2019-06-11 ENCOUNTER — Other Ambulatory Visit: Payer: Self-pay

## 2019-06-11 DIAGNOSIS — I714 Abdominal aortic aneurysm, without rupture, unspecified: Secondary | ICD-10-CM

## 2019-06-18 ENCOUNTER — Ambulatory Visit (HOSPITAL_COMMUNITY)
Admission: RE | Admit: 2019-06-18 | Discharge: 2019-06-18 | Disposition: A | Discharge status: Home / Self Care | Payer: Medicare HMO | Source: Ambulatory Visit | Attending: Vascular Surgery | Admitting: Vascular Surgery

## 2019-06-18 ENCOUNTER — Encounter: Payer: Self-pay | Admitting: Vascular Surgery

## 2019-06-18 ENCOUNTER — Other Ambulatory Visit: Payer: Self-pay

## 2019-06-18 ENCOUNTER — Ambulatory Visit (INDEPENDENT_AMBULATORY_CARE_PROVIDER_SITE_OTHER): Payer: Medicare HMO | Admitting: Vascular Surgery

## 2019-06-18 VITALS — BP 109/69 | HR 62 | Temp 97.3°F | Resp 18 | Ht 73.0 in | Wt 142.0 lb

## 2019-06-18 DIAGNOSIS — I714 Abdominal aortic aneurysm, without rupture, unspecified: Secondary | ICD-10-CM

## 2019-06-18 NOTE — Progress Notes (Signed)
Patient name: [name] [name] MRN: [id number] DOB: 1923/04/27 Sex: male  HPI: [name] [name] is a 85 y.o. male with a known abdominal aortic aneurysm.  It was 4.6 cm in diameter in 2013.  Most recent ultrasound in February 2020 showed the aneurysm had grown to 5.8 cm in diameter.  He returns today for further follow-up.  He denies any abdominal pain.  He has chronic back pain.  He does live independently.  However he has an aide who works from 11 AM to 2 PM every day to help with meals and cleaning.  He ambulates with a walker.  Other medical problems include atrial fibrillation, congestive heart failure, hypertension all of which are currently stable.  Past Medical History:  Diagnosis Date  . AAA (abdominal aortic aneurysm) without rupture ([place])    10/2012 CT scan: 4.3x4.6cm  . Arthritis    "legs are stiff" , had spinal injections for pain relieve 3-4 yrs. ago  . Atrial fibrillation-permanent   . Bradycardia   . Cataract   . CHF (congestive heart failure) ([place])   . Constipation   . Depression   . Diverticulosis   . GERD (gastroesophageal reflux disease)   . Hypertension   . Nonischemic/ ischemic cardiomyopathy    Single-vessel coronary disease catheterization 2004-Myoview showing inferior wall perfusion defect Myoview 2006  . Recurrent upper respiratory infection (URI)    recent cold- treating /w OTC  . Subdural hematoma (HCC)    Prior evacuation   Past Surgical History:  Procedure Laterality Date  . BRAIN SURGERY     2002  . CATARACT EXTRACTION W/PHACO  01/30/2012   Procedure: CATARACT EXTRACTION PHACO AND INTRAOCULAR LENS PLACEMENT (IOC);  Surgeon: Adonis Brook, MD;  Location: [facility];  Service: Ophthalmology;  Laterality: Right;  . evacuation of subdural hematoma    . EYE SURGERY     cataract extracted in L eye  . GAS/FLUID EXCHANGE  07/30/2012   Procedure: GAS/FLUID EXCHANGE;  Surgeon: Adonis Brook, MD;  Location: [facility];  Service: Ophthalmology;  Laterality: Right;  . HERNIA  REPAIR     R side inguinal repair   . PACEMAKER INSERTION  08/26/12   SJM Accent SR RF pacemaker  . PARS PLANA VITRECTOMY  07/30/2012   Procedure: PARS PLANA VITRECTOMY WITH 23 GAUGE;  Surgeon: Adonis Brook, MD;  Location: [facility] [facility];  Service: Ophthalmology;  Laterality: Right;  Insertion of Silicone Oil  . PERMANENT PACEMAKER INSERTION N/A 08/26/2012   Procedure: PERMANENT PACEMAKER INSERTION;  Surgeon: Thompson Grayer, MD;  Location: [facility] CATH LAB;  Service: Cardiovascular;  Laterality: N/A;  . TONSILLECTOMY     1957    Family History  Problem Relation Age of Onset  . Anesthesia problems Neg Hx   . Hypotension Neg Hx   . Malignant hyperthermia Neg Hx   . Pseudochol deficiency Neg Hx     SOCIAL HISTORY: Social History   Socioeconomic History  . Marital status: Widowed    Spouse name: Not on file  . Number of children: Not on file  . Years of education: Not on file  . Highest education level: Not on file  Occupational History  . Not on file  Social Needs  . Financial resource strain: Not on file  . Food insecurity    Worry: Not on file    Inability: Not on file  . Transportation needs    Medical: Not on file    Non-medical: Not on file  Tobacco Use  . Smoking status: Former Smoker  Packs/day: 0.25    Years: 15.00    Pack years: 3.75    Quit date: 12/24/1994    Years since quitting: 24.4  . Smokeless tobacco: Never Used  Substance and Sexual Activity  . Alcohol use: No  . Drug use: No  . Sexual activity: Not on file  Lifestyle  . Physical activity    Days per week: Not on file    Minutes per session: Not on file  . Stress: Not on file  Relationships  . Social Herbalist on phone: Not on file    Gets together: Not on file    Attends religious service: Not on file    Active member of club or organization: Not on file    Attends meetings of clubs or organizations: Not on file    Relationship status: Not on file  . Intimate partner violence    Fear of current or  ex partner: Not on file    Emotionally abused: Not on file    Physically abused: Not on file    Forced sexual activity: Not on file  Other Topics Concern  . Not on file  Social History Narrative  . Not on file    No Known Allergies  Current Outpatient Medications  Medication Sig Dispense Refill  . carvedilol (COREG) 3.125 MG tablet Take 1 tablet (3.125 mg total) by mouth 2 (two) times daily with a meal. 60 tablet 3  . clopidogrel (PLAVIX) 75 MG tablet Take 75 mg by mouth. Pt taking half of 75 mg daily    . docusate sodium (COLACE) 100 MG capsule Take 1 capsule (100 mg total) by mouth daily as needed for mild constipation. 10 capsule 0  . pantoprazole (PROTONIX) 40 MG tablet Take 40 mg by mouth 2 (two) times daily.    . Probiotic Product (PROBIOTIC DAILY PO) Take 1 capsule by mouth daily. Reported on 03/28/2016    . ramipril (ALTACE) 1.25 MG capsule Take 1 capsule (1.25 mg total) by mouth daily. 30 capsule 3   No current facility-administered medications for this visit.     ROS:   General:  No weight loss, Fever, chills  Cardiac: No recent episodes of chest pain/pressure, no shortness of breath at rest.  No shortness of breath with exertion.  Denies history of atrial fibrillation or irregular heartbeat  Pulmonary: No home oxygen, no productive cough, no hemoptysis,  No asthma or wheezing    Physical Examination  Vitals:   06/18/19 0851  BP: 109/69  Pulse: 62  Resp: 18  Temp: (!) 97.3 F (36.3 C)  SpO2: 95%  Weight: 142 lb (64.4 kg)  Height: 6\' 1"  (1.854 m)    Body mass index is 18.73 kg/m.  General:  Alert and oriented, no acute distress HEENT: Normal Cardiac: Irregular Abdomen: Soft, non-tender, non-distended, pulsatile mass to the left of the epigastrium Skin: No rash Extremity Pulses:  2+ radial, brachial, femoral, 3+ right popliteal absent left popliteal Musculoskeletal: No deformity or edema  Neurologic: Upper and lower extremity motor 5/5 and symmetric   DATA:  Aortic ultrasound today shows diameter is 5.3 cm.  Reviewed and interpreted the study.  ASSESSMENT: 5.3 cm aneurysm asymptomatic and a 85 year old male who is reasonably well compensated system wise but overall frail in appearance.  Risk of rupture of a 5.3 cm aneurysm he is overall fairly low.  I discussed with the patient and his daughter today that we could consider getting another ultrasound in 1 year.  However, in light of the patient's age and overall frailty I do not believe that it is in his best interest to consider repair of this aneurysm if it does continue enlarging over time.  In discussions with the patient and his daughter today we will no longer follow the aneurysm with ultrasound since we are not going to consider repair in an elective fashion or if this ruptures at some point in the future.   PLAN: Patient will follow-up on an as-needed basis.  He understands that if the aneurysm ruptures this will take his life.  However in light of the fact that he is 85 years old there are certainly other possibilities that could precipitate his death prior to this especially if the aneurysm is truly 5.3 cm in diameter.  He understands and agrees.   Ruta Hinds, MD [facility] Office: [phone] Pager: [phone]

## 2019-07-28 DIAGNOSIS — H6122 Impacted cerumen, left ear: Secondary | ICD-10-CM | POA: Insufficient documentation

## 2019-07-28 HISTORY — DX: Impacted cerumen, left ear: H61.22

## 2019-08-27 ENCOUNTER — Encounter: Payer: Medicare HMO | Admitting: Podiatry

## 2019-09-15 ENCOUNTER — Ambulatory Visit: Payer: Medicare HMO

## 2019-09-15 ENCOUNTER — Encounter: Payer: Self-pay | Admitting: Podiatry

## 2019-09-15 ENCOUNTER — Ambulatory Visit (INDEPENDENT_AMBULATORY_CARE_PROVIDER_SITE_OTHER): Payer: Medicare HMO | Admitting: Podiatry

## 2019-09-15 ENCOUNTER — Other Ambulatory Visit: Payer: Self-pay

## 2019-09-15 VITALS — BP 136/98 | HR 60 | Resp 16

## 2019-09-15 DIAGNOSIS — M79676 Pain in unspecified toe(s): Secondary | ICD-10-CM

## 2019-09-15 DIAGNOSIS — M7741 Metatarsalgia, right foot: Secondary | ICD-10-CM | POA: Diagnosis not present

## 2019-09-15 DIAGNOSIS — M2041 Other hammer toe(s) (acquired), right foot: Secondary | ICD-10-CM | POA: Diagnosis not present

## 2019-09-15 DIAGNOSIS — B351 Tinea unguium: Secondary | ICD-10-CM

## 2019-09-15 DIAGNOSIS — D689 Coagulation defect, unspecified: Secondary | ICD-10-CM

## 2019-09-15 DIAGNOSIS — M7742 Metatarsalgia, left foot: Secondary | ICD-10-CM

## 2019-09-15 DIAGNOSIS — M2042 Other hammer toe(s) (acquired), left foot: Secondary | ICD-10-CM

## 2019-09-15 NOTE — Progress Notes (Signed)
Subjective:  Patient ID: [name] [name], male    DOB: [birth year],  MRN: [id number] HPI Chief Complaint  Patient presents with  . Foot Pain    Plantar bilateral - burning sensations intermittent, hammer toe deformities  . Nail Problem    Toenails - discolored and brittle  . New Patient (Initial Visit)    85 y.o. male presents with the above complaint.   ROS: He denies fever chills nausea vomiting muscle aches pains calf pain back pain chest pain shortness of breath.  Past Medical History:  Diagnosis Date  . AAA (abdominal aortic aneurysm) without rupture ([place])    10/2012 CT scan: 4.3x4.6cm  . Arthritis    "legs are stiff" , had spinal injections for pain relieve 3-4 yrs. ago  . Atrial fibrillation-permanent   . Bradycardia   . Cataract   . CHF (congestive heart failure) ([place])   . Constipation   . Depression   . Diverticulosis   . GERD (gastroesophageal reflux disease)   . Hypertension   . Nonischemic/ ischemic cardiomyopathy    Single-vessel coronary disease catheterization 2004-Myoview showing inferior wall perfusion defect Myoview 2006  . Recurrent upper respiratory infection (URI)    recent cold- treating /w OTC  . Subdural hematoma (HCC)    Prior evacuation   Past Surgical History:  Procedure Laterality Date  . BRAIN SURGERY     2002  . CATARACT EXTRACTION W/PHACO  01/30/2012   Procedure: CATARACT EXTRACTION PHACO AND INTRAOCULAR LENS PLACEMENT (IOC);  Surgeon: Adonis Brook, MD;  Location: [place];  Service: Ophthalmology;  Laterality: Right;  . evacuation of subdural hematoma    . EYE SURGERY     cataract extracted in L eye  . GAS/FLUID EXCHANGE  07/30/2012   Procedure: GAS/FLUID EXCHANGE;  Surgeon: Adonis Brook, MD;  Location: [facility];  Service: Ophthalmology;  Laterality: Right;  . HERNIA REPAIR     R side inguinal repair   . PACEMAKER INSERTION  08/26/12   SJM Accent SR RF pacemaker  . PARS PLANA VITRECTOMY  07/30/2012   Procedure: PARS PLANA VITRECTOMY WITH 23 GAUGE;   Surgeon: Adonis Brook, MD;  Location: [place];  Service: Ophthalmology;  Laterality: Right;  Insertion of Silicone Oil  . PERMANENT PACEMAKER INSERTION N/A 08/26/2012   Procedure: PERMANENT PACEMAKER INSERTION;  Surgeon: Thompson Grayer, MD;  Location: [facility] C[facility] LAB;  Service: Cardiovascular;  Laterality: N/A;  . TONSILLECTOMY     1957    Current Outpatient Medications:  .  carvedilol (COREG) 3.125 MG tablet, Take 1 tablet (3.125 mg total) by mouth 2 (two) times daily with a meal., Disp: 60 tablet, Rfl: 3 .  clopidogrel (PLAVIX) 75 MG tablet, Take 75 mg by mouth. Pt taking half of 75 mg daily, Disp: , Rfl:  .  diclofenac sodium (VOLTAREN) 1 % GEL, APPLY 4 GRAMS TO THE AFFECTED AREAS TOPICALLY FOUR TIMES PER DAY, Disp: , Rfl:  .  docusate sodium (COLACE) 100 MG capsule, Take 1 capsule (100 mg total) by mouth daily as needed for mild constipation., Disp: 10 capsule, Rfl: 0 .  GAVILAX 17 GM/SCOOP powder, TAKE ONE CAPFUL (17 GRAMS) , MIX WITH 8 OUNCES OF WATER,JUICE,SODA,COFFEE OR TEA AND DRINK DAILY, Disp: , Rfl:  .  mirtazapine (REMERON) 15 MG tablet, , Disp: , Rfl:  .  pantoprazole (PROTONIX) 40 MG tablet, Take 40 mg by mouth 2 (two) times daily., Disp: , Rfl:  .  Probiotic Product (PROBIOTIC DAILY PO), Take 1 capsule by mouth daily. Reported on 03/28/2016,  Disp: , Rfl:  .  ramipril (ALTACE) 1.25 MG capsule, Take 1 capsule (1.25 mg total) by mouth daily., Disp: 30 capsule, Rfl: 3 .  Vitamin D, Ergocalciferol, (DRISDOL) 1.25 MG (50000 UT) CAPS capsule, Take 50,000 Units by mouth once a week., Disp: , Rfl:   No Known Allergies Review of Systems Objective:   Vitals:   09/15/19 1037  BP: (!) 136/98  Pulse: 60  Resp: 16    General: Well developed, nourished, in no acute distress, alert and oriented x3   Dermatological: Skin is warm, dry and supple bilateral. Nails x 10 are well maintained; remaining integument appears unremarkable at this time. There are no open sores, no preulcerative lesions, no  rash or signs of infection present.  Toenails are long thick yellow dystrophic-like mycotic painful palpation as well as debridement.  Vascular: Dorsalis Pedis artery and Posterior Tibial artery pedal pulses are 2/4 bilateral with immedate capillary fill time. Pedal hair growth present. No varicosities and no lower extremity edema present bilateral.   Neruologic: Grossly intact via light touch bilateral. Vibratory intact via tuning fork bilateral. Protective threshold with Semmes Wienstein monofilament intact to all pedal sites bilateral. Patellar and Achilles deep tendon reflexes 2+ bilateral. No Babinski or clonus noted bilateral.  Musculoskeletal: No gross boney pedal deformities bilateral. No pain, crepitus, or limitation noted with foot and ankle range of motion bilateral. Muscular strength 5/5 in all groups tested bilateral.  Rigid hammertoe deformity 2 through 5 bilaterally he has pain on palpation of the metatarsal heads bilaterally.  Most likely this is secondary to his hammertoe deformities..  Gait: Unassisted, Nonantalgic.    Radiographs:  None taken  Assessment & Plan:   Assessment: Metatarsalgia bilateral forefoot with mild paresthesias.  Pain in limb secondary to onychomycosis 1 through 5 bilateral.  Plan: We discussed etiology pathology conservative versus surgical therapies.  At this point I highly recommended the use of diclofenac gel which she already has at home to be applied to his feet nightly whether he is having pain or not.  I also recommended silicone metatarsal head pads which I dispensed to him today.  And I debrided his toenails 1 through 5 bilaterally.  He will follow-up with Dr. Elisha Ponder in a couple of months for routine nail care.      T. [place], [place]

## 2019-10-06 NOTE — Progress Notes (Signed)
This encounter was created in error - please disregard.

## 2019-12-09 ENCOUNTER — Ambulatory Visit: Payer: Medicare HMO | Admitting: Podiatry

## 2019-12-11 ENCOUNTER — Ambulatory Visit (INDEPENDENT_AMBULATORY_CARE_PROVIDER_SITE_OTHER): Payer: Medicare HMO | Admitting: Podiatry

## 2019-12-11 ENCOUNTER — Other Ambulatory Visit: Payer: Self-pay

## 2019-12-11 ENCOUNTER — Encounter: Payer: Self-pay | Admitting: Podiatry

## 2019-12-11 DIAGNOSIS — M79671 Pain in right foot: Secondary | ICD-10-CM | POA: Diagnosis not present

## 2019-12-11 DIAGNOSIS — L97412 Non-pressure chronic ulcer of right heel and midfoot with fat layer exposed: Secondary | ICD-10-CM | POA: Diagnosis not present

## 2019-12-11 DIAGNOSIS — D689 Coagulation defect, unspecified: Secondary | ICD-10-CM

## 2019-12-11 NOTE — Progress Notes (Signed)
Subjective:  Patient ID: [name] [name], male    DOB: 03-11-1923,  MRN: [id number]  Chief Complaint  Patient presents with  . Foot Ulcer    right heel  . Nail Problem    nail trim bilateral digits 1-10    85 y.o. male presents for wound care.  Patient presents with right heel ulceration that has been going on for past 2 to 3 weeks.  Patient states it is tender to ambulate.  He states that it is getting worse progressively.  He ambulates with regular sneakers without any supportive device.  He states that it is throbbing sensation.  He denies any other clinical signs of infection.  He denies any nausea fever chills vomiting.  He ambulates with a walker.   Review of Systems: Negative except as noted in the HPI. Denies N/V/F/Ch.  Past Medical History:  Diagnosis Date  . AAA (abdominal aortic aneurysm) without rupture ([place])    10/2012 CT scan: 4.3x4.6cm  . Arthritis    "legs are stiff" , had spinal injections for pain relieve 3-4 yrs. ago  . Atrial fibrillation-permanent   . Bradycardia   . Cataract   . CHF (congestive heart failure) ([place])   . Constipation   . Depression   . Diverticulosis   . GERD (gastroesophageal reflux disease)   . Hypertension   . Nonischemic/ ischemic cardiomyopathy    Single-vessel coronary disease catheterization 2004-Myoview showing inferior wall perfusion defect Myoview 2006  . Recurrent upper respiratory infection (URI)    recent cold- treating /w OTC  . Subdural hematoma (HCC)    Prior evacuation    Current Outpatient Medications:  .  carvedilol (COREG) 3.125 MG tablet, Take 1 tablet (3.125 mg total) by mouth 2 (two) times daily with a meal., Disp: 60 tablet, Rfl: 3 .  clopidogrel (PLAVIX) 75 MG tablet, Take 75 mg by mouth. Pt taking half of 75 mg daily, Disp: , Rfl:  .  diclofenac sodium (VOLTAREN) 1 % GEL, APPLY 4 GRAMS TO THE AFFECTED AREAS TOPICALLY FOUR TIMES PER DAY, Disp: , Rfl:  .  docusate sodium (COLACE) 100 MG capsule, Take 1 capsule (100  mg total) by mouth daily as needed for mild constipation., Disp: 10 capsule, Rfl: 0 .  GAVILAX 17 GM/SCOOP powder, TAKE ONE CAPFUL (17 GRAMS) , MIX WITH 8 OUNCES OF WATER,JUICE,SODA,COFFEE OR TEA AND DRINK DAILY, Disp: , Rfl:  .  mirtazapine (REMERON) 15 MG tablet, , Disp: , Rfl:  .  pantoprazole (PROTONIX) 40 MG tablet, Take 40 mg by mouth 2 (two) times daily., Disp: , Rfl:  .  Probiotic Product (PROBIOTIC DAILY PO), Take 1 capsule by mouth daily. Reported on 03/28/2016, Disp: , Rfl:  .  ramipril (ALTACE) 1.25 MG capsule, Take 1 capsule (1.25 mg total) by mouth daily., Disp: 30 capsule, Rfl: 3 .  Vitamin D, Ergocalciferol, (DRISDOL) 1.25 MG (50000 UT) CAPS capsule, Take 50,000 Units by mouth once a week., Disp: , Rfl:   Social History   Tobacco Use  Smoking Status Former Smoker  . Packs/day: 0.25  . Years: 15.00  . Pack years: 3.75  . Quit date: 12/24/1994  . Years since quitting: 24.9  Smokeless Tobacco Never Used    No Known Allergies Objective:  There were no vitals filed for this visit. There is no height or weight on file to calculate BMI. Constitutional Well developed. Well nourished.  Vascular Dorsalis pedis pulses palpable bilaterally. Posterior tibial pulses palpable bilaterally. Capillary refill normal to all digits.  No  cyanosis or clubbing noted. Pedal hair growth normal.  Neurologic Normal speech. Oriented to person, place, and time. Protective sensation absent  Dermatologic Wound Location: R heel Wound Base: Mixed Granular/Fibrotic Peri-wound: Normal Exudate: Scant/small amount Serous exudate Wound Measurements: -See below  Orthopedic: No pain to palpation either foot.   Radiographs: None Assessment:   1. Coagulation defect ([place])   2. Heel ulcer, right, with fat layer exposed ([place])   3. Pain of right heel    Plan:  Patient was evaluated and treated and all questions answered.  Ulcer right heel -Debridement as below. -Dressed with Betadine wet-to-dry,  DSD. -Continue off-loading with surgical shoe.  Procedure: Excisional Debridement of Wound Rationale: Removal of non-viable soft tissue from the wound to promote healing.  Anesthesia: none Pre-Debridement Wound Measurements: 3 cm x 2 cm x 0.3 cm  Post-Debridement Wound Measurements: 3 cm x 1.9 cm x 0.3 cm  Type of Debridement: Sharp Excisional Tissue Removed: Non-viable soft tissue Depth of Debridement: subcutaneous tissue. Technique: Sharp excisional debridement to bleeding, viable wound base.  Dressing: Dry, sterile, compression dressing. Disposition: Patient tolerated procedure well. Patient to return in 1 week for follow-up.  No follow-ups on file.

## 2019-12-28 ENCOUNTER — Other Ambulatory Visit: Payer: Self-pay

## 2019-12-28 ENCOUNTER — Ambulatory Visit (INDEPENDENT_AMBULATORY_CARE_PROVIDER_SITE_OTHER): Payer: Medicare HMO | Admitting: Podiatry

## 2019-12-28 DIAGNOSIS — B351 Tinea unguium: Secondary | ICD-10-CM

## 2019-12-28 DIAGNOSIS — M79675 Pain in left toe(s): Secondary | ICD-10-CM | POA: Diagnosis not present

## 2019-12-28 DIAGNOSIS — M79671 Pain in right foot: Secondary | ICD-10-CM | POA: Diagnosis not present

## 2019-12-28 DIAGNOSIS — L97412 Non-pressure chronic ulcer of right heel and midfoot with fat layer exposed: Secondary | ICD-10-CM

## 2019-12-28 DIAGNOSIS — M79674 Pain in right toe(s): Secondary | ICD-10-CM

## 2019-12-28 DIAGNOSIS — D689 Coagulation defect, unspecified: Secondary | ICD-10-CM

## 2019-12-29 ENCOUNTER — Encounter: Payer: Self-pay | Admitting: Podiatry

## 2019-12-29 NOTE — Progress Notes (Signed)
Subjective:  Patient ID: [name] [name], male    DOB: 09-05-1923,  MRN: [id number]  Chief Complaint  Patient presents with  . Foot Pain    pt is here for a hammer toe f/u of the right foot, pt states that the right foot is doing a lot better since the last time he was here, and has no other comments or concerns    86 y.o. male presents for wound care.  Patient presents with a follow-up of a heel ulceration.  Patient states it is doing better.  However he has not been wearing the cam boot.  Patient states that they have been doing regular Betadine wet-to-dry dressing changes.  Patient is also having a secondary complaint of nail pain across all of his toes.  He states that they hurt when ambulating.  He would like to have them debrided.  He denies any other acute complaints.   Review of Systems: Negative except as noted in the HPI. Denies N/V/F/Ch.  Past Medical History:  Diagnosis Date  . AAA (abdominal aortic aneurysm) without rupture ([place])    10/2012 CT scan: 4.3x4.6cm  . Arthritis    "legs are stiff" , had spinal injections for pain relieve 3-4 yrs. ago  . Atrial fibrillation-permanent   . Bradycardia   . Cataract   . CHF (congestive heart failure) ([place])   . Constipation   . Depression   . Diverticulosis   . GERD (gastroesophageal reflux disease)   . Hypertension   . Nonischemic/ ischemic cardiomyopathy    Single-vessel coronary disease catheterization 2004-Myoview showing inferior wall perfusion defect Myoview 2006  . Recurrent upper respiratory infection (URI)    recent cold- treating /w OTC  . Subdural hematoma (HCC)    Prior evacuation    Current Outpatient Medications:  .  carvedilol (COREG) 3.125 MG tablet, Take 1 tablet (3.125 mg total) by mouth 2 (two) times daily with a meal., Disp: 60 tablet, Rfl: 3 .  clopidogrel (PLAVIX) 75 MG tablet, Take 75 mg by mouth. Pt taking half of 75 mg daily, Disp: , Rfl:  .  diclofenac sodium (VOLTAREN) 1 % GEL, APPLY 4 GRAMS TO THE  AFFECTED AREAS TOPICALLY FOUR TIMES PER DAY, Disp: , Rfl:  .  docusate sodium (COLACE) 100 MG capsule, Take 1 capsule (100 mg total) by mouth daily as needed for mild constipation., Disp: 10 capsule, Rfl: 0 .  doxycycline (VIBRAMYCIN) 100 MG capsule, , Disp: , Rfl:  .  GAVILAX 17 GM/SCOOP powder, TAKE ONE CAPFUL (17 GRAMS) , MIX WITH 8 OUNCES OF WATER,JUICE,SODA,COFFEE OR TEA AND DRINK DAILY, Disp: , Rfl:  .  mirtazapine (REMERON) 15 MG tablet, , Disp: , Rfl:  .  pantoprazole (PROTONIX) 40 MG tablet, Take 40 mg by mouth 2 (two) times daily., Disp: , Rfl:  .  Probiotic Product (PROBIOTIC DAILY PO), Take 1 capsule by mouth daily. Reported on 03/28/2016, Disp: , Rfl:  .  ramipril (ALTACE) 1.25 MG capsule, Take 1 capsule (1.25 mg total) by mouth daily., Disp: 30 capsule, Rfl: 3 .  traMADol (ULTRAM) 50 MG tablet, , Disp: , Rfl:  .  Vitamin D, Ergocalciferol, (DRISDOL) 1.25 MG (50000 UT) CAPS capsule, Take 50,000 Units by mouth once a week., Disp: , Rfl:   Social History   Tobacco Use  Smoking Status Former Smoker  . Packs/day: 0.25  . Years: 15.00  . Pack years: 3.75  . Quit date: 12/24/1994  . Years since quitting: 25.0  Smokeless Tobacco Never Used  No Known Allergies Objective:  There were no vitals filed for this visit. There is no height or weight on file to calculate BMI. Constitutional Well developed. Well nourished.  Vascular Dorsalis pedis pulses palpable bilaterally. Posterior tibial pulses palpable bilaterally. Capillary refill normal to all digits.  No cyanosis or clubbing noted. Pedal hair growth normal.  Neurologic Normal speech. Oriented to person, place, and time. Protective sensation absent  Dermatologic Wound Location: R heel Wound Base: Mixed Granular/Fibrotic Peri-wound: Normal Exudate: Scant/small amount Serous exudate Wound Measurements: -See below Pt has thick disfigured discolored nails with subungual debris noted bilateral entire nail hallux through fifth  toenails  Orthopedic: No pain to palpation either foot.   Radiographs: None Assessment:   1. Coagulation defect ([place])   2. Heel ulcer, right, with fat layer exposed ([place])   3. Pain of right heel   4. Pain due to onychomycosis of toenails of both feet    Plan:  Patient was evaluated and treated and all questions answered.  Ulcer right heel with fat layer exposed-worsening -Debridement as below. -Dressed with Betadine wet-to-dry, DSD. -Continue off-loading with cam boot.  Educated the patient the importance of offloading this heel.  If this gets worse patient is a high risk for limb loss given the nature of the wound.  Patient maintains understanding and will aggressively offload with a cam boot.  Procedure: Excisional Debridement of Wound Rationale: Removal of non-viable soft tissue from the wound to promote healing.  Anesthesia: none Pre-Debridement Wound Measurements: 3 cm x 2 cm x 0.3 cm  Post-Debridement Wound Measurements: 3 cm x 2.5 cm x 0.3 cm  Type of Debridement: Sharp Excisional Tissue Removed: Non-viable soft tissue Depth of Debridement: subcutaneous tissue. Technique: Sharp excisional debridement to bleeding, viable wound base.  Dressing: Dry, sterile, compression dressing. Disposition: Patient tolerated procedure well. Patient to return in 1 week for follow-up.  Onychomycosis with pain  -Nails palliatively debrided as below. -Educated on self-care  Procedure: Nail Debridement Rationale: pain  Type of Debridement: manual, sharp debridement. Instrumentation: Nail nipper, rotary burr. Number of Nails: 10  Procedures and Treatment: Consent by patient was obtained for treatment procedures. The patient understood the discussion of treatment and procedures well. All questions were answered thoroughly reviewed. Debridement of mycotic and hypertrophic toenails, 1 through 5 bilateral and clearing of subungual debris. No ulceration, no infection noted.  Return Visit-Office  Procedure: Patient instructed to return to the office for a follow up visit 3 months for continued evaluation and treatment.  [name], DPM    No follow-ups on file.   No follow-ups on file.

## 2020-01-06 ENCOUNTER — Ambulatory Visit: Payer: Medicare HMO | Admitting: Podiatry

## 2020-01-08 ENCOUNTER — Telehealth: Payer: Self-pay | Admitting: Podiatry

## 2020-01-08 ENCOUNTER — Ambulatory Visit (INDEPENDENT_AMBULATORY_CARE_PROVIDER_SITE_OTHER): Payer: Medicare HMO | Admitting: Podiatry

## 2020-01-08 ENCOUNTER — Other Ambulatory Visit: Payer: Self-pay

## 2020-01-08 DIAGNOSIS — M79671 Pain in right foot: Secondary | ICD-10-CM

## 2020-01-08 DIAGNOSIS — D689 Coagulation defect, unspecified: Secondary | ICD-10-CM | POA: Diagnosis not present

## 2020-01-08 DIAGNOSIS — L97412 Non-pressure chronic ulcer of right heel and midfoot with fat layer exposed: Secondary | ICD-10-CM | POA: Diagnosis not present

## 2020-01-08 MED ORDER — TRAMADOL HCL 50 MG PO TABS: 50.0000 mg | ORAL_TABLET | Freq: Four times a day (QID) | ORAL | 0 refills | Status: AC | PRN

## 2020-01-08 NOTE — Telephone Encounter (Signed)
Pt was seen in office today and was told the doctor would be prescribing him a medicated cream. Pts daughter went to the pharmacy to pick up the prescriptions and they had not received anything for the cream. Pt calling to follow up

## 2020-01-11 ENCOUNTER — Encounter: Payer: Self-pay | Admitting: Podiatry

## 2020-01-11 NOTE — Progress Notes (Signed)
Subjective:  Patient ID: [name] [name], male    DOB: Jun 27, 1923,  MRN: [id number]  Chief Complaint  Patient presents with  . Foot Ulcer    pt is here for a ulcer f/u of the right heel, pt states that the right heel still often hurts when walking, pt also states that the bandages are clean dry and intact, and shows no signs of infection    86 y.o. male presents for wound care.  Patient is follow-up of the ulcer of the right heel.  Patient shows no signs of clinical infection.  Patient states that they have been keeping the bandages nice client dry and intact.  Patient states is often hurting to walk on.  Patient has been wearing a cam boot religiously.  He states that they have been doing regular dressing changes.  He denies any other acute complaints.   Review of Systems: Negative except as noted in the HPI. Denies N/V/F/Ch.  Past Medical History:  Diagnosis Date  . AAA (abdominal aortic aneurysm) without rupture ([place])    10/2012 CT scan: 4.3x4.6cm  . Arthritis    "legs are stiff" , had spinal injections for pain relieve 3-4 yrs. ago  . Atrial fibrillation-permanent   . Bradycardia   . Cataract   . CHF (congestive heart failure) ([place])   . Constipation   . Depression   . Diverticulosis   . GERD (gastroesophageal reflux disease)   . Hypertension   . Nonischemic/ ischemic cardiomyopathy    Single-vessel coronary disease catheterization 2004-Myoview showing inferior wall perfusion defect Myoview 2006  . Recurrent upper respiratory infection (URI)    recent cold- treating /w OTC  . Subdural hematoma (HCC)    Prior evacuation    Current Outpatient Medications:  .  carvedilol (COREG) 3.125 MG tablet, Take 1 tablet (3.125 mg total) by mouth 2 (two) times daily with a meal., Disp: 60 tablet, Rfl: 3 .  clopidogrel (PLAVIX) 75 MG tablet, Take 75 mg by mouth. Pt taking half of 75 mg daily, Disp: , Rfl:  .  diclofenac sodium (VOLTAREN) 1 % GEL, APPLY 4 GRAMS TO THE AFFECTED AREAS  TOPICALLY FOUR TIMES PER DAY, Disp: , Rfl:  .  docusate sodium (COLACE) 100 MG capsule, Take 1 capsule (100 mg total) by mouth daily as needed for mild constipation., Disp: 10 capsule, Rfl: 0 .  doxycycline (VIBRAMYCIN) 100 MG capsule, , Disp: , Rfl:  .  GAVILAX 17 GM/SCOOP powder, TAKE ONE CAPFUL (17 GRAMS) , MIX WITH 8 OUNCES OF WATER,JUICE,SODA,COFFEE OR TEA AND DRINK DAILY, Disp: , Rfl:  .  mirtazapine (REMERON) 15 MG tablet, , Disp: , Rfl:  .  pantoprazole (PROTONIX) 40 MG tablet, Take 40 mg by mouth 2 (two) times daily., Disp: , Rfl:  .  Probiotic Product (PROBIOTIC DAILY PO), Take 1 capsule by mouth daily. Reported on 03/28/2016, Disp: , Rfl:  .  ramipril (ALTACE) 1.25 MG capsule, Take 1 capsule (1.25 mg total) by mouth daily., Disp: 30 capsule, Rfl: 3 .  traMADol (ULTRAM) 50 MG tablet, Take 1 tablet (50 mg total) by mouth every 6 (six) hours as needed for up to 8 days., Disp: 30 tablet, Rfl: 0 .  Vitamin D, Ergocalciferol, (DRISDOL) 1.25 MG (50000 UT) CAPS capsule, Take 50,000 Units by mouth once a week., Disp: , Rfl:   Social History   Tobacco Use  Smoking Status Former Smoker  . Packs/day: 0.25  . Years: 15.00  . Pack years: 3.75  . Quit date: 12/24/1994  .  Years since quitting: 25.0  Smokeless Tobacco Never Used    No Known Allergies Objective:  There were no vitals filed for this visit. There is no height or weight on file to calculate BMI. Constitutional Well developed. Well nourished.  Vascular Dorsalis pedis pulses palpable bilaterally. Posterior tibial pulses palpable bilaterally. Capillary refill normal to all digits.  No cyanosis or clubbing noted. Pedal hair growth normal.  Neurologic Normal speech. Oriented to person, place, and time. Protective sensation absent  Dermatologic Wound Location: R heel ulceration slowly improving. Wound Base: Mixed Granular/Fibrotic Peri-wound: Normal Exudate: Scant/small amount Serous exudate Wound Measurements: -See below Pt  has thick disfigured discolored nails with subungual debris noted bilateral entire nail hallux through fifth toenails  Orthopedic: No pain to palpation either foot.   Radiographs: None Assessment:   1. Coagulation defect ([place])   2. Heel ulcer, right, with fat layer exposed ([place])   3. Pain of right heel    Plan:  Patient was evaluated and treated and all questions answered.  Ulcer right heel with fat layer exposed-slowly improving -Debridement as below. -Dressed with Betadine wet-to-dry, DSD. -Continue off-loading with cam boot.  Educated the patient the importance of offloading this heel.  If this gets worse patient is a high risk for limb loss given the nature of the wound.  Patient maintains understanding and will aggressively offload with a cam boot.  Procedure: Excisional Debridement of Wound Rationale: Removal of non-viable soft tissue from the wound to promote healing.  Anesthesia: none Pre-Debridement Wound Measurements: 3 cm x 2 cm x 0.3 cm  Post-Debridement Wound Measurements: 2.5 cm x 2.5 cm x 1.3 cm Type of Debridement: Sharp Excisional Tissue Removed: Non-viable soft tissue Depth of Debridement: subcutaneous tissue. Technique: Sharp excisional debridement to bleeding, viable wound base.  Dressing: Dry, sterile, compression dressing. Disposition: Patient tolerated procedure well. Patient to return in 1 week for follow-up.   [name] [name], DPM    Return in about 1 week (around 01/15/2020).   Return in about 1 week (around 01/15/2020).

## 2020-01-12 NOTE — Telephone Encounter (Signed)
Hey Darrick, please advise. Thanks Lattie Haw

## 2020-01-22 ENCOUNTER — Other Ambulatory Visit: Payer: Self-pay

## 2020-01-22 ENCOUNTER — Ambulatory Visit: Payer: Medicare HMO | Admitting: Podiatry

## 2020-01-22 ENCOUNTER — Ambulatory Visit (INDEPENDENT_AMBULATORY_CARE_PROVIDER_SITE_OTHER): Payer: Medicare HMO | Admitting: Podiatry

## 2020-01-22 DIAGNOSIS — M79671 Pain in right foot: Secondary | ICD-10-CM | POA: Diagnosis not present

## 2020-01-22 DIAGNOSIS — D689 Coagulation defect, unspecified: Secondary | ICD-10-CM | POA: Diagnosis not present

## 2020-01-22 DIAGNOSIS — L97412 Non-pressure chronic ulcer of right heel and midfoot with fat layer exposed: Secondary | ICD-10-CM | POA: Diagnosis not present

## 2020-01-25 ENCOUNTER — Encounter: Payer: Self-pay | Admitting: Podiatry

## 2020-01-25 NOTE — Progress Notes (Signed)
Subjective:  Patient ID: [name] [name], male    DOB: 04/28/1923,  MRN: [id number]  Chief Complaint  Patient presents with  . Foot Ulcer    pt is here for 2 week ulcer f/u, ulcer is closing up, pt state shtat he has been keeping nice and dry, and has been applying the boot    86 y.o. male presents for wound care.  Patient is follow-up of the ulcer of the right heel.  Patient states that he is doing a little bit better.  It appears that the wound itself is slowly decreasing.  However he has gone to the point of stagnation.  Patient shows no signs of clinical infection.  Patient states that they have been keeping the bandages nice client dry and intact.  Patient states is often hurting to walk on.  Patient has been wearing a cam boot religiously.  He states that they have been doing regular dressing changes.  He denies any other acute complaints.    Review of Systems: Negative except as noted in the HPI. Denies N/V/F/Ch.  Past Medical History:  Diagnosis Date  . AAA (abdominal aortic aneurysm) without rupture ([place])    10/2012 CT scan: 4.3x4.6cm  . Arthritis    "legs are stiff" , had spinal injections for pain relieve 3-4 yrs. ago  . Atrial fibrillation-permanent   . Bradycardia   . Cataract   . CHF (congestive heart failure) ([place])   . Constipation   . Depression   . Diverticulosis   . GERD (gastroesophageal reflux disease)   . Hypertension   . Nonischemic/ ischemic cardiomyopathy    Single-vessel coronary disease catheterization 2004-Myoview showing inferior wall perfusion defect Myoview 2006  . Recurrent upper respiratory infection (URI)    recent cold- treating /w OTC  . Subdural hematoma (HCC)    Prior evacuation    Current Outpatient Medications:  .  carvedilol (COREG) 3.125 MG tablet, Take 1 tablet (3.125 mg total) by mouth 2 (two) times daily with a meal., Disp: 60 tablet, Rfl: 3 .  clopidogrel (PLAVIX) 75 MG tablet, Take 75 mg by mouth. Pt taking half of 75 mg daily, Disp:  , Rfl:  .  diclofenac sodium (VOLTAREN) 1 % GEL, APPLY 4 GRAMS TO THE AFFECTED AREAS TOPICALLY FOUR TIMES PER DAY, Disp: , Rfl:  .  docusate sodium (COLACE) 100 MG capsule, Take 1 capsule (100 mg total) by mouth daily as needed for mild constipation., Disp: 10 capsule, Rfl: 0 .  doxycycline (VIBRAMYCIN) 100 MG capsule, , Disp: , Rfl:  .  GAVILAX 17 GM/SCOOP powder, TAKE ONE CAPFUL (17 GRAMS) , MIX WITH 8 OUNCES OF WATER,JUICE,SODA,COFFEE OR TEA AND DRINK DAILY, Disp: , Rfl:  .  mirtazapine (REMERON) 15 MG tablet, , Disp: , Rfl:  .  pantoprazole (PROTONIX) 40 MG tablet, Take 40 mg by mouth 2 (two) times daily., Disp: , Rfl:  .  Probiotic Product (PROBIOTIC DAILY PO), Take 1 capsule by mouth daily. Reported on 03/28/2016, Disp: , Rfl:  .  ramipril (ALTACE) 1.25 MG capsule, Take 1 capsule (1.25 mg total) by mouth daily., Disp: 30 capsule, Rfl: 3 .  Vitamin D, Ergocalciferol, (DRISDOL) 1.25 MG (50000 UT) CAPS capsule, Take 50,000 Units by mouth once a week., Disp: , Rfl:   Social History   Tobacco Use  Smoking Status Former Smoker  . Packs/day: 0.25  . Years: 15.00  . Pack years: 3.75  . Quit date: 12/24/1994  . Years since quitting: 25.1  Smokeless Tobacco Never Used  No Known Allergies Objective:  There were no vitals filed for this visit. There is no height or weight on file to calculate BMI. Constitutional Well developed. Well nourished.  Vascular Dorsalis pedis pulses palpable bilaterally. Posterior tibial pulses palpable bilaterally. Capillary refill normal to all digits.  No cyanosis or clubbing noted. Pedal hair growth normal.  Neurologic Normal speech. Oriented to person, place, and time. Protective sensation absent  Dermatologic Wound Location: R heel ulceration slowly improving. Wound Base: Mixed Granular/Fibrotic Peri-wound: Normal Exudate: Scant/small amount Serous exudate Wound Measurements: -See below Pt has thick disfigured discolored nails with subungual debris  noted bilateral entire nail hallux through fifth toenails  Orthopedic: No pain to palpation either foot.   Radiographs: None Assessment:   1. Coagulation defect ([place])   2. Heel ulcer, right, with fat layer exposed ([place])   3. Pain of right heel    Plan:  Patient was evaluated and treated and all questions answered.  Ulcer right heel with fat layer exposed-stagnant -Debridement as below. -Dressed with Betadine wet-to-dry, DSD. -Continue off-loading with cam boot.  Educated the patient the importance of offloading this heel.  If this gets worse patient is a high risk for limb loss given the nature of the wound.  Patient maintains understanding and will aggressively offload with a cam boot. -I discussed with the patient that he will benefit from tight medical graft to help decrease the size given that this has been going on for greater than 1 month and it is starting to become stagnant.  Patient states understanding. -If approved I will start the times medical graft during next office visit.  Procedure: Excisional Debridement of Wound Rationale: Removal of non-viable soft tissue from the wound to promote healing.  Anesthesia: none Pre-Debridement Wound Measurements: 2.5 cm x 2.5 cm x 1.3 cm  Post-Debridement Wound Measurements: same Type of Debridement: Sharp Excisional Tissue Removed: Non-viable soft tissue Depth of Debridement: subcutaneous tissue. Technique: Sharp excisional debridement to bleeding, viable wound base.  Dressing: Dry, sterile, compression dressing. Disposition: Patient tolerated procedure well. Patient to return in 1 week for follow-up.   [name], DPM    No follow-ups on file.   No follow-ups on file.

## 2020-01-29 ENCOUNTER — Ambulatory Visit (INDEPENDENT_AMBULATORY_CARE_PROVIDER_SITE_OTHER): Payer: Medicare HMO | Admitting: Podiatry

## 2020-01-29 ENCOUNTER — Other Ambulatory Visit: Payer: Self-pay

## 2020-01-29 ENCOUNTER — Ambulatory Visit: Payer: Medicare HMO | Admitting: Podiatry

## 2020-01-29 DIAGNOSIS — D689 Coagulation defect, unspecified: Secondary | ICD-10-CM

## 2020-01-29 DIAGNOSIS — M79671 Pain in right foot: Secondary | ICD-10-CM

## 2020-01-29 DIAGNOSIS — L97412 Non-pressure chronic ulcer of right heel and midfoot with fat layer exposed: Secondary | ICD-10-CM

## 2020-02-01 ENCOUNTER — Encounter: Payer: Self-pay | Admitting: Podiatry

## 2020-02-01 NOTE — Progress Notes (Signed)
Subjective:  Patient ID: [name] [name], male    DOB: 10-26-1923,  MRN: [id number]  Chief Complaint  Patient presents with  . Routine Post Op    1 wk F/U appt for Ulcer RT heel, pt shows no signs of infection, pt is looking to see if he is gonna be approved for graft.    86 y.o. male presents for wound care.  Patient is follow-up of the ulcer of the right heel.  Patient states that he is doing a little bit better.  It appears that the wound itself is slowly decreasing.  However he has gone to the point of stagnation.  Patient shows no signs of clinical infection.  Patient states that they have been keeping the bandages nice client dry and intact.  Patient states is often hurting to walk on.  Patient has been wearing a cam boot religiously.  He states that they have been doing regular dressing changes.  He denies any other acute complaints.    Review of Systems: Negative except as noted in the HPI. Denies N/V/F/Ch.  Past Medical History:  Diagnosis Date  . AAA (abdominal aortic aneurysm) without rupture ([place])    10/2012 CT scan: 4.3x4.6cm  . Arthritis    "legs are stiff" , had spinal injections for pain relieve 3-4 yrs. ago  . Atrial fibrillation-permanent   . Bradycardia   . Cataract   . CHF (congestive heart failure) ([place])   . Constipation   . Depression   . Diverticulosis   . GERD (gastroesophageal reflux disease)   . Hypertension   . Nonischemic/ ischemic cardiomyopathy    Single-vessel coronary disease catheterization 2004-Myoview showing inferior wall perfusion defect Myoview 2006  . Recurrent upper respiratory infection (URI)    recent cold- treating /w OTC  . Subdural hematoma (HCC)    Prior evacuation    Current Outpatient Medications:  .  carvedilol (COREG) 3.125 MG tablet, Take 1 tablet (3.125 mg total) by mouth 2 (two) times daily with a meal., Disp: 60 tablet, Rfl: 3 .  clopidogrel (PLAVIX) 75 MG tablet, Take 75 mg by mouth. Pt taking half of 75 mg daily, Disp: ,  Rfl:  .  diclofenac sodium (VOLTAREN) 1 % GEL, APPLY 4 GRAMS TO THE AFFECTED AREAS TOPICALLY FOUR TIMES PER DAY, Disp: , Rfl:  .  docusate sodium (COLACE) 100 MG capsule, Take 1 capsule (100 mg total) by mouth daily as needed for mild constipation., Disp: 10 capsule, Rfl: 0 .  doxycycline (VIBRAMYCIN) 100 MG capsule, , Disp: , Rfl:  .  GAVILAX 17 GM/SCOOP powder, TAKE ONE CAPFUL (17 GRAMS) , MIX WITH 8 OUNCES OF WATER,JUICE,SODA,COFFEE OR TEA AND DRINK DAILY, Disp: , Rfl:  .  mirtazapine (REMERON) 15 MG tablet, , Disp: , Rfl:  .  pantoprazole (PROTONIX) 40 MG tablet, Take 40 mg by mouth 2 (two) times daily., Disp: , Rfl:  .  Probiotic Product (PROBIOTIC DAILY PO), Take 1 capsule by mouth daily. Reported on 03/28/2016, Disp: , Rfl:  .  ramipril (ALTACE) 1.25 MG capsule, Take 1 capsule (1.25 mg total) by mouth daily., Disp: 30 capsule, Rfl: 3 .  Vitamin D, Ergocalciferol, (DRISDOL) 1.25 MG (50000 UT) CAPS capsule, Take 50,000 Units by mouth once a week., Disp: , Rfl:   Social History   Tobacco Use  Smoking Status Former Smoker  . Packs/day: 0.25  . Years: 15.00  . Pack years: 3.75  . Quit date: 12/24/1994  . Years since quitting: 25.1  Smokeless Tobacco Never Used  No Known Allergies Objective:  There were no vitals filed for this visit. There is no height or weight on file to calculate BMI. Constitutional Well developed. Well nourished.  Vascular Dorsalis pedis pulses palpable bilaterally. Posterior tibial pulses palpable bilaterally. Capillary refill normal to all digits.  No cyanosis or clubbing noted. Pedal hair growth normal.  Neurologic Normal speech. Oriented to person, place, and time. Protective sensation absent  Dermatologic Wound Location: R heel ulceration slowly improving. Wound Base: Mixed Granular/Fibrotic Peri-wound: Normal Exudate: Scant/small amount Serous exudate Wound Measurements: -See below Pt has thick disfigured discolored nails with subungual debris  noted bilateral entire nail hallux through fifth toenails  Orthopedic: No pain to palpation either foot.   Radiographs: None Assessment:   1. Coagulation defect ([place])   2. Heel ulcer, right, with fat layer exposed ([place])   3. Pain of right heel    Plan:  Patient was evaluated and treated and all questions answered.  Ulcer right heel with fat layer exposed-stagnant -Debridement as below. -Dressed with Betadine wet-to-dry, DSD. -Continue off-loading with cam boot.  Educated the patient the importance of offloading this heel.  If this gets worse patient is a high risk for limb loss given the nature of the wound.  Patient maintains understanding and will aggressively offload with a cam boot. -I am still awaiting approval of tides medical graft for application to the right heel.  I am hopeful that the graft will be approved by next office visit.  Procedure: Excisional Debridement of Wound Rationale: Removal of non-viable soft tissue from the wound to promote healing.  Anesthesia: none Pre-Debridement Wound Measurements: 2.5 cm x 2.5 cm x 1.3 cm  Post-Debridement Wound Measurements: same Type of Debridement: Sharp Excisional Tissue Removed: Non-viable soft tissue Depth of Debridement: subcutaneous tissue. Technique: Sharp excisional debridement to bleeding, viable wound base.  Dressing: Dry, sterile, compression dressing. Disposition: Patient tolerated procedure well. Patient to return in 1 week for follow-up.   [name], DPM    Return in about 1 week (around 02/05/2020).   Return in about 1 week (around 02/05/2020).

## 2020-02-05 ENCOUNTER — Encounter: Payer: Self-pay | Admitting: Podiatry

## 2020-02-05 ENCOUNTER — Other Ambulatory Visit: Payer: Self-pay

## 2020-02-05 ENCOUNTER — Ambulatory Visit (INDEPENDENT_AMBULATORY_CARE_PROVIDER_SITE_OTHER): Payer: Medicare HMO | Admitting: Podiatry

## 2020-02-05 DIAGNOSIS — L97412 Non-pressure chronic ulcer of right heel and midfoot with fat layer exposed: Secondary | ICD-10-CM

## 2020-02-05 DIAGNOSIS — D689 Coagulation defect, unspecified: Secondary | ICD-10-CM | POA: Diagnosis not present

## 2020-02-05 DIAGNOSIS — M79671 Pain in right foot: Secondary | ICD-10-CM | POA: Diagnosis not present

## 2020-02-05 NOTE — Progress Notes (Signed)
Subjective:  Patient ID: [name] [name], male    DOB: May 10, 1923,  MRN: [id number]  Chief Complaint  Patient presents with  . Foot Ulcer    pt is here for an ulcer check of the right foot, pt shows no signs of infection, pt is looking to see if he is approved for the graft    86 y.o. male presents for wound care.  Patient is follow-up of the ulcer of the right heel.  Patient states that he is doing a little bit better.  It appears that the wound itself is slowly decreasing.  However he has gone to the point of stagnation.  Patient shows no signs of clinical infection.  Patient states that they have been keeping the bandages nice client dry and intact.  Patient states is often hurting to walk on.  Patient has been wearing a cam boot religiously.  He states that they have been doing regular dressing changes.  He denies any other acute complaints.    Review of Systems: Negative except as noted in the HPI. Denies N/V/F/Ch.  Past Medical History:  Diagnosis Date  . AAA (abdominal aortic aneurysm) without rupture ([place])    10/2012 CT scan: 4.3x4.6cm  . Arthritis    "legs are stiff" , had spinal injections for pain relieve 3-4 yrs. ago  . Atrial fibrillation-permanent   . Bradycardia   . Cataract   . CHF (congestive heart failure) ([place])   . Constipation   . Depression   . Diverticulosis   . GERD (gastroesophageal reflux disease)   . Hypertension   . Nonischemic/ ischemic cardiomyopathy    Single-vessel coronary disease catheterization 2004-Myoview showing inferior wall perfusion defect Myoview 2006  . Recurrent upper respiratory infection (URI)    recent cold- treating /w OTC  . Subdural hematoma (HCC)    Prior evacuation    Current Outpatient Medications:  .  carvedilol (COREG) 3.125 MG tablet, Take 1 tablet (3.125 mg total) by mouth 2 (two) times daily with a meal., Disp: 60 tablet, Rfl: 3 .  clopidogrel (PLAVIX) 75 MG tablet, Take 75 mg by mouth. Pt taking half of 75 mg daily, Disp:  , Rfl:  .  diclofenac sodium (VOLTAREN) 1 % GEL, APPLY 4 GRAMS TO THE AFFECTED AREAS TOPICALLY FOUR TIMES PER DAY, Disp: , Rfl:  .  docusate sodium (COLACE) 100 MG capsule, Take 1 capsule (100 mg total) by mouth daily as needed for mild constipation., Disp: 10 capsule, Rfl: 0 .  doxycycline (VIBRAMYCIN) 100 MG capsule, , Disp: , Rfl:  .  GAVILAX 17 GM/SCOOP powder, TAKE ONE CAPFUL (17 GRAMS) , MIX WITH 8 OUNCES OF WATER,JUICE,SODA,COFFEE OR TEA AND DRINK DAILY, Disp: , Rfl:  .  mirtazapine (REMERON) 15 MG tablet, , Disp: , Rfl:  .  mirtazapine (REMERON) 30 MG tablet, , Disp: , Rfl:  .  pantoprazole (PROTONIX) 40 MG tablet, Take 40 mg by mouth 2 (two) times daily., Disp: , Rfl:  .  Probiotic Product (PROBIOTIC DAILY PO), Take 1 capsule by mouth daily. Reported on 03/28/2016, Disp: , Rfl:  .  ramipril (ALTACE) 1.25 MG capsule, Take 1 capsule (1.25 mg total) by mouth daily., Disp: 30 capsule, Rfl: 3 .  Vitamin D, Ergocalciferol, (DRISDOL) 1.25 MG (50000 UT) CAPS capsule, Take 50,000 Units by mouth once a week., Disp: , Rfl:   Social History   Tobacco Use  Smoking Status Former Smoker  . Packs/day: 0.25  . Years: 15.00  . Pack years: 3.75  . Quit  date: 12/24/1994  . Years since quitting: 25.1  Smokeless Tobacco Never Used    No Known Allergies Objective:  There were no vitals filed for this visit. There is no height or weight on file to calculate BMI. Constitutional Well developed. Well nourished.  Vascular Dorsalis pedis pulses palpable bilaterally. Posterior tibial pulses palpable bilaterally. Capillary refill normal to all digits.  No cyanosis or clubbing noted. Pedal hair growth normal.  Neurologic Normal speech. Oriented to person, place, and time. Protective sensation absent  Dermatologic Wound Location: R heel ulceration slowly improving. Wound Base: Mixed Granular/Fibrotic Peri-wound: Normal Exudate: Scant/small amount Serous exudate Wound Measurements: -See below Pt has  thick disfigured discolored nails with subungual debris noted bilateral entire nail hallux through fifth toenails  Orthopedic: No pain to palpation either foot.   Radiographs: None Assessment:   1. Heel ulcer, right, with fat layer exposed ([place])   2. Coagulation defect ([place])   3. Pain of right heel    Plan:  Patient was evaluated and treated and all questions answered.  Ulcer right heel with fat layer exposed-stagnant -Debridement as below. -Dressed with Betadine wet-to-dry, DSD. -Continue off-loading with cam boot.  Educated the patient the importance of offloading this heel.  If this gets worse patient is a high risk for limb loss given the nature of the wound.  Patient maintains understanding and will aggressively offload with a cam boot. -I am still awaiting approval of tides medical graft for application to the right heel.  I am hopeful that the graft will be approved by next office visit.  Procedure: Excisional Debridement of Wound Rationale: Removal of non-viable soft tissue from the wound to promote healing.  Anesthesia: none Pre-Debridement Wound Measurements: 2.5 cm x 2.5 cm x 1.3 cm  Post-Debridement Wound Measurements: same Type of Debridement: Sharp Excisional Tissue Removed: Non-viable soft tissue Depth of Debridement: subcutaneous tissue. Technique: Sharp excisional debridement to bleeding, viable wound base.  Dressing: Dry, sterile, compression dressing. Disposition: Patient tolerated procedure well. Patient to return in 1 week for follow-up.   [name], DPM    No follow-ups on file.   No follow-ups on file.

## 2020-02-10 ENCOUNTER — Telehealth: Payer: Self-pay | Admitting: Podiatry

## 2020-02-10 NOTE — Telephone Encounter (Signed)
I will I need to schedule a peer to peer her whole day schedule her for me?

## 2020-02-10 NOTE — Telephone Encounter (Addendum)
Received a call from [place] with [facility] Case# [id number]  She stated the clinicals for the tides medical graft for application to the right heel is lacking support for approval. The medical director will be calling to do a peer to peer with Dr. Posey Pronto. I will notify Dr. Posey Pronto when he calls.  Dr. Lucila Maine with [employer] called to do a peer to peer review, requested a return call at [phone] ref case# [id number].  Will notify Dr. Posey Pronto to call.

## 2020-02-11 NOTE — Telephone Encounter (Signed)
Hi,  Can you fax the clinical over from 01-29-20 and 02-05-20 to fax number [phone]. He just needed that to approve it.  Thanks  Lennette Bihari

## 2020-02-12 ENCOUNTER — Ambulatory Visit: Payer: Medicare HMO | Admitting: Podiatry

## 2020-02-12 ENCOUNTER — Telehealth: Payer: Self-pay

## 2020-02-12 NOTE — Telephone Encounter (Signed)
Received a voicemail from [place] with [place], she stated the authorization for the tides medical graft for application to the right heel has been approved - auth # [id number].

## 2020-02-19 ENCOUNTER — Ambulatory Visit (INDEPENDENT_AMBULATORY_CARE_PROVIDER_SITE_OTHER): Payer: Medicare HMO | Admitting: Podiatry

## 2020-02-19 ENCOUNTER — Other Ambulatory Visit: Payer: Self-pay

## 2020-02-19 DIAGNOSIS — L97412 Non-pressure chronic ulcer of right heel and midfoot with fat layer exposed: Secondary | ICD-10-CM | POA: Diagnosis not present

## 2020-02-19 DIAGNOSIS — D689 Coagulation defect, unspecified: Secondary | ICD-10-CM

## 2020-02-19 DIAGNOSIS — M79671 Pain in right foot: Secondary | ICD-10-CM | POA: Diagnosis not present

## 2020-02-23 ENCOUNTER — Encounter: Payer: Self-pay | Admitting: Podiatry

## 2020-02-23 NOTE — Progress Notes (Addendum)
Subjective:  Patient ID: [name] [name], male    DOB: [birth year],  MRN: [id number]  Chief Complaint  Patient presents with  . Wound Check    pt is here for a wound check for the bottom of the right heel, pt shows no signs of infection, pt is also here for his first graft application.    86 y.o. male presents for wound care.  Patient is follow-up of the ulcer of the right heel.  Patient states that he is doing a little bit better.  Patient is here for a graft application this will be his first application.  Patient shows no clinical signs of infection.  Patient is very happy that the graft was approved.  Patient states that it hurts to walk on.  He denies any other acute complaints.  They have been keeping clean dry and intact and well bandaged.    Review of Systems: Negative except as noted in the HPI. Denies N/V/F/Ch.  Past Medical History:  Diagnosis Date  . AAA (abdominal aortic aneurysm) without rupture ([place])    10/2012 CT scan: 4.3x4.6cm  . Arthritis    "legs are stiff" , had spinal injections for pain relieve 3-4 yrs. ago  . Atrial fibrillation-permanent   . Bradycardia   . Cataract   . CHF (congestive heart failure) ([place])   . Constipation   . Depression   . Diverticulosis   . GERD (gastroesophageal reflux disease)   . Hypertension   . Nonischemic/ ischemic cardiomyopathy    Single-vessel coronary disease catheterization 2004-Myoview showing inferior wall perfusion defect Myoview 2006  . Recurrent upper respiratory infection (URI)    recent cold- treating /w OTC  . Subdural hematoma (HCC)    Prior evacuation    Current Outpatient Medications:  .  carvedilol (COREG) 3.125 MG tablet, Take 1 tablet (3.125 mg total) by mouth 2 (two) times daily with a meal., Disp: 60 tablet, Rfl: 3 .  clopidogrel (PLAVIX) 75 MG tablet, Take 75 mg by mouth. Pt taking half of 75 mg daily, Disp: , Rfl:  .  diclofenac sodium (VOLTAREN) 1 % GEL, APPLY 4 GRAMS TO THE AFFECTED AREAS TOPICALLY FOUR  TIMES PER DAY, Disp: , Rfl:  .  docusate sodium (COLACE) 100 MG capsule, Take 1 capsule (100 mg total) by mouth daily as needed for mild constipation., Disp: 10 capsule, Rfl: 0 .  doxycycline (VIBRAMYCIN) 100 MG capsule, , Disp: , Rfl:  .  GAVILAX 17 GM/SCOOP powder, TAKE ONE CAPFUL (17 GRAMS) , MIX WITH 8 OUNCES OF WATER,JUICE,SODA,COFFEE OR TEA AND DRINK DAILY, Disp: , Rfl:  .  mirtazapine (REMERON) 15 MG tablet, , Disp: , Rfl:  .  mirtazapine (REMERON) 30 MG tablet, , Disp: , Rfl:  .  pantoprazole (PROTONIX) 40 MG tablet, Take 40 mg by mouth 2 (two) times daily., Disp: , Rfl:  .  Probiotic Product (PROBIOTIC DAILY PO), Take 1 capsule by mouth daily. Reported on 03/28/2016, Disp: , Rfl:  .  ramipril (ALTACE) 1.25 MG capsule, Take 1 capsule (1.25 mg total) by mouth daily., Disp: 30 capsule, Rfl: 3 .  Vitamin D, Ergocalciferol, (DRISDOL) 1.25 MG (50000 UT) CAPS capsule, Take 50,000 Units by mouth once a week., Disp: , Rfl:   Social History   Tobacco Use  Smoking Status Former Smoker  . Packs/day: 0.25  . Years: 15.00  . Pack years: 3.75  . Quit date: 12/24/1994  . Years since quitting: 25.1  Smokeless Tobacco Never Used    No Known Allergies  Objective:  There were no vitals filed for this visit. There is no height or weight on file to calculate BMI. Constitutional Well developed. Well nourished.  Vascular Dorsalis pedis pulses palpable bilaterally. Posterior tibial pulses palpable bilaterally. Capillary refill normal to all digits.  No cyanosis or clubbing noted. Pedal hair growth normal.  Neurologic Normal speech. Oriented to person, place, and time. Protective sensation absent  Dermatologic Wound Location: R heel ulceration slowly improving. Wound Base: Mixed Granular/Fibrotic Peri-wound: Normal Exudate: Scant/small amount Serous exudate Wound Measurements: -See below Pt has thick disfigured discolored nails with subungual debris noted bilateral entire nail hallux through  fifth toenails  Orthopedic: No pain to palpation either foot.   Radiographs: None Assessment:   1. Heel ulcer, right, with fat layer exposed ([place])   2. Coagulation defect ([place])   3. Pain of right heel    Plan:  Patient was evaluated and treated and all questions answered.  Ulcer right heel with fat layer exposed-stagnant -Debridement as below. -Dressed with Betadine wet-to-dry, DSD. -Continue off-loading with cam boot.  Educated the patient the importance of offloading this heel.  If this gets worse patient is a high risk for limb loss given the nature of the wound.  Patient maintains understanding and will aggressively offload with a cam boot. -Today will be the first application of tides medical graft.  4 x 4 times medical graft was used to cover the wound that was measured below.  Application of synthetic skin graft/substitute  Name: Tides Medical Graft  Usage: 4 x 4 cm Graft was applied directly to the listed above wound site and secured with Adaptic, kerlix, Ace bandage. Waste: No graft material was wasted and was applied in its entirety.  Procedure: Excisional Debridement of Wound Rationale: Removal of non-viable soft tissue from the wound to promote healing.  Anesthesia: none Pre-Debridement Wound Measurements: 2.5 cm x 2.5 cm x 1.3 cm  Post-Debridement Wound Measurements: same Type of Debridement: Sharp Excisional Tissue Removed: Non-viable soft tissue Depth of Debridement: subcutaneous tissue. Technique: Sharp excisional debridement to bleeding, viable wound base.  Dressing: Dry, sterile, compression dressing. Disposition: Patient tolerated procedure well. Patient to return in 1 week for follow-up.   [name], DPM    No follow-ups on file.   No follow-ups on file.

## 2020-02-24 ENCOUNTER — Ambulatory Visit (INDEPENDENT_AMBULATORY_CARE_PROVIDER_SITE_OTHER): Payer: Medicare HMO | Admitting: Podiatry

## 2020-02-24 ENCOUNTER — Other Ambulatory Visit: Payer: Self-pay

## 2020-02-24 DIAGNOSIS — D689 Coagulation defect, unspecified: Secondary | ICD-10-CM | POA: Diagnosis not present

## 2020-02-24 DIAGNOSIS — M79671 Pain in right foot: Secondary | ICD-10-CM

## 2020-02-24 DIAGNOSIS — L97412 Non-pressure chronic ulcer of right heel and midfoot with fat layer exposed: Secondary | ICD-10-CM

## 2020-02-25 ENCOUNTER — Encounter: Payer: Self-pay | Admitting: Podiatry

## 2020-02-25 NOTE — Progress Notes (Addendum)
Subjective:  Patient ID: [name] [name], male    DOB: January 18, 1923,  MRN: [id number]  Chief Complaint  Patient presents with  . Foot Ulcer    pt is here for a foot ulcer f/u of the right foot, pt is here for a graft change 4x4     86 y.o. male presents for wound care.  Patient is follow-up of the ulcer of the right heel.  Patient states that he is doing a little bit better.  Patient is here for a graft application this will be his second application.  Patient shows no clinical signs of infection.  Patient states that it hurts to walk on.  He denies any other acute complaints.  They have been keeping clean dry and intact and well bandaged.    Review of Systems: Negative except as noted in the HPI. Denies N/V/F/Ch.  Past Medical History:  Diagnosis Date  . AAA (abdominal aortic aneurysm) without rupture ([place])    10/2012 CT scan: 4.3x4.6cm  . Arthritis    "legs are stiff" , had spinal injections for pain relieve 3-4 yrs. ago  . Atrial fibrillation-permanent   . Bradycardia   . Cataract   . CHF (congestive heart failure) ([place])   . Constipation   . Depression   . Diverticulosis   . GERD (gastroesophageal reflux disease)   . Hypertension   . Nonischemic/ ischemic cardiomyopathy    Single-vessel coronary disease catheterization 2004-Myoview showing inferior wall perfusion defect Myoview 2006  . Recurrent upper respiratory infection (URI)    recent cold- treating /w OTC  . Subdural hematoma (HCC)    Prior evacuation    Current Outpatient Medications:  .  carvedilol (COREG) 3.125 MG tablet, Take 1 tablet (3.125 mg total) by mouth 2 (two) times daily with a meal., Disp: 60 tablet, Rfl: 3 .  clopidogrel (PLAVIX) 75 MG tablet, Take 75 mg by mouth. Pt taking half of 75 mg daily, Disp: , Rfl:  .  diclofenac sodium (VOLTAREN) 1 % GEL, APPLY 4 GRAMS TO THE AFFECTED AREAS TOPICALLY FOUR TIMES PER DAY, Disp: , Rfl:  .  docusate sodium (COLACE) 100 MG capsule, Take 1 capsule (100 mg total) by  mouth daily as needed for mild constipation., Disp: 10 capsule, Rfl: 0 .  doxycycline (VIBRAMYCIN) 100 MG capsule, , Disp: , Rfl:  .  GAVILAX 17 GM/SCOOP powder, TAKE ONE CAPFUL (17 GRAMS) , MIX WITH 8 OUNCES OF WATER,JUICE,SODA,COFFEE OR TEA AND DRINK DAILY, Disp: , Rfl:  .  mirtazapine (REMERON) 15 MG tablet, , Disp: , Rfl:  .  mirtazapine (REMERON) 30 MG tablet, , Disp: , Rfl:  .  pantoprazole (PROTONIX) 40 MG tablet, Take 40 mg by mouth 2 (two) times daily., Disp: , Rfl:  .  Probiotic Product (PROBIOTIC DAILY PO), Take 1 capsule by mouth daily. Reported on 03/28/2016, Disp: , Rfl:  .  ramipril (ALTACE) 1.25 MG capsule, Take 1 capsule (1.25 mg total) by mouth daily., Disp: 30 capsule, Rfl: 3 .  Vitamin D, Ergocalciferol, (DRISDOL) 1.25 MG (50000 UT) CAPS capsule, Take 50,000 Units by mouth once a week., Disp: , Rfl:   Social History   Tobacco Use  Smoking Status Former Smoker  . Packs/day: 0.25  . Years: 15.00  . Pack years: 3.75  . Quit date: 12/24/1994  . Years since quitting: 25.1  Smokeless Tobacco Never Used    No Known Allergies Objective:  There were no vitals filed for this visit. There is no height or weight on file  to calculate BMI. Constitutional Well developed. Well nourished.  Vascular Dorsalis pedis pulses palpable bilaterally. Posterior tibial pulses palpable bilaterally. Capillary refill normal to all digits.  No cyanosis or clubbing noted. Pedal hair growth normal.  Neurologic Normal speech. Oriented to person, place, and time. Protective sensation absent  Dermatologic Wound Location: R heel ulceration  improving. Wound Base: Mixed Granular/Fibrotic Peri-wound: Normal Exudate: Scant/small amount Serous exudate Wound Measurements: -See below Pt has thick disfigured discolored nails with subungual debris noted bilateral entire nail hallux through fifth toenails  Orthopedic: No pain to palpation either foot.   Radiographs: None Assessment:   1. Coagulation  defect ([place])   2. Heel ulcer, right, with fat layer exposed ([place])   3. Pain of right heel    Plan:  Patient was evaluated and treated and all questions answered.  Ulcer right heel with fat layer exposed-regressing -Debridement as below. -Dressed with Betadine wet-to-dry, DSD. -Continue off-loading with cam boot.  Educated the patient the importance of offloading this heel.  If this gets worse patient is a high risk for limb loss given the nature of the wound.  Patient maintains understanding and will aggressively offload with a cam boot. -Today will be the second application of tides medical graft.  4 x 4 times medical graft was used to cover the wound that was measured below.  Application of synthetic skin graft/substitute  Name: Tides Medical Graft  Usage: 4 x 4 cm Graft was applied directly to the listed above wound site and secured with Adaptic, kerlix, Ace bandage. Waste: No graft material was wasted and was applied in its entirety.  Procedure: Excisional Debridement of Wound Rationale: Removal of non-viable soft tissue from the wound to promote healing.  Anesthesia: none Pre-Debridement Wound Measurements: 2.1 cm x 2.1 cm x 0.9 cm Post-Debridement Wound Measurements: same Type of Debridement: Sharp Excisional Tissue Removed: Non-viable soft tissue Depth of Debridement: subcutaneous tissue. Technique: Sharp excisional debridement to bleeding, viable wound base.  Dressing: Dry, sterile, compression dressing. Disposition: Patient tolerated procedure well. Patient to return in 1 week for follow-up.   [name], DPM    No follow-ups on file.   No follow-ups on file.

## 2020-03-04 ENCOUNTER — Ambulatory Visit (INDEPENDENT_AMBULATORY_CARE_PROVIDER_SITE_OTHER): Payer: Medicare HMO | Admitting: Podiatry

## 2020-03-04 ENCOUNTER — Encounter: Payer: Self-pay | Admitting: Podiatry

## 2020-03-04 ENCOUNTER — Other Ambulatory Visit: Payer: Self-pay

## 2020-03-04 DIAGNOSIS — M79671 Pain in right foot: Secondary | ICD-10-CM

## 2020-03-04 DIAGNOSIS — D689 Coagulation defect, unspecified: Secondary | ICD-10-CM | POA: Diagnosis not present

## 2020-03-04 DIAGNOSIS — L97412 Non-pressure chronic ulcer of right heel and midfoot with fat layer exposed: Secondary | ICD-10-CM

## 2020-03-04 NOTE — Progress Notes (Addendum)
Subjective:  Patient ID: [name] [name], male    DOB: [birth year],  MRN: [id number]  Chief Complaint  Patient presents with  . Foot Pain    pt is here for an ulcer check of the right foot, pt shows no signs of infection, pt also states that the right foot is doing alot better since the last time he was here, pt is possibly recieving a 2x2 graft application change    86 y.o. male presents for wound care.  Patient is follow-up of the ulcer of the right heel.  Patient states that he is doing a little bit better.  Patient is here for a graft application this will be his third application.  Patient shows no clinical signs of infection.  Patient states that it hurts to walk on.  He denies any other acute complaints.  They have been keeping clean dry and intact and well bandaged.    Review of Systems: Negative except as noted in the HPI. Denies N/V/F/Ch.  Past Medical History:  Diagnosis Date  . AAA (abdominal aortic aneurysm) without rupture ([place])    10/2012 CT scan: 4.3x4.6cm  . Arthritis    "legs are stiff" , had spinal injections for pain relieve 3-4 yrs. ago  . Atrial fibrillation-permanent   . Bradycardia   . Cataract   . CHF (congestive heart failure) ([place])   . Constipation   . Depression   . Diverticulosis   . GERD (gastroesophageal reflux disease)   . Hypertension   . Nonischemic/ ischemic cardiomyopathy    Single-vessel coronary disease catheterization 2004-Myoview showing inferior wall perfusion defect Myoview 2006  . Recurrent upper respiratory infection (URI)    recent cold- treating /w OTC  . Subdural hematoma (HCC)    Prior evacuation    Current Outpatient Medications:  .  carvedilol (COREG) 3.125 MG tablet, Take 1 tablet (3.125 mg total) by mouth 2 (two) times daily with a meal., Disp: 60 tablet, Rfl: 3 .  clopidogrel (PLAVIX) 75 MG tablet, Take 75 mg by mouth. Pt taking half of 75 mg daily, Disp: , Rfl:  .  diclofenac sodium (VOLTAREN) 1 % GEL, APPLY 4 GRAMS TO THE  AFFECTED AREAS TOPICALLY FOUR TIMES PER DAY, Disp: , Rfl:  .  docusate sodium (COLACE) 100 MG capsule, Take 1 capsule (100 mg total) by mouth daily as needed for mild constipation., Disp: 10 capsule, Rfl: 0 .  doxycycline (VIBRAMYCIN) 100 MG capsule, , Disp: , Rfl:  .  GAVILAX 17 GM/SCOOP powder, TAKE ONE CAPFUL (17 GRAMS) , MIX WITH 8 OUNCES OF WATER,JUICE,SODA,COFFEE OR TEA AND DRINK DAILY, Disp: , Rfl:  .  mirtazapine (REMERON) 15 MG tablet, , Disp: , Rfl:  .  mirtazapine (REMERON) 30 MG tablet, , Disp: , Rfl:  .  pantoprazole (PROTONIX) 40 MG tablet, Take 40 mg by mouth 2 (two) times daily., Disp: , Rfl:  .  Probiotic Product (PROBIOTIC DAILY PO), Take 1 capsule by mouth daily. Reported on 03/28/2016, Disp: , Rfl:  .  ramipril (ALTACE) 1.25 MG capsule, Take 1 capsule (1.25 mg total) by mouth daily., Disp: 30 capsule, Rfl: 3 .  Vitamin D, Ergocalciferol, (DRISDOL) 1.25 MG (50000 UT) CAPS capsule, Take 50,000 Units by mouth once a week., Disp: , Rfl:   Social History   Tobacco Use  Smoking Status Former Smoker  . Packs/day: 0.25  . Years: 15.00  . Pack years: 3.75  . Quit date: 12/24/1994  . Years since quitting: 25.2  Smokeless Tobacco Never Used  No Known Allergies Objective:  There were no vitals filed for this visit. There is no height or weight on file to calculate BMI. Constitutional Well developed. Well nourished.  Vascular Dorsalis pedis pulses palpable bilaterally. Posterior tibial pulses palpable bilaterally. Capillary refill normal to all digits.  No cyanosis or clubbing noted. Pedal hair growth normal.  Neurologic Normal speech. Oriented to person, place, and time. Protective sensation absent  Dermatologic Wound Location: R heel ulceration  improving. Wound Base: Mixed Granular/Fibrotic Peri-wound: Normal Exudate: Scant/small amount Serous exudate Wound Measurements: -See below Pt has thick disfigured discolored nails with subungual debris noted bilateral entire  nail hallux through fifth toenails  Orthopedic: No pain to palpation either foot.   Radiographs: None Assessment:   1. Heel ulcer, right, with fat layer exposed ([place])   2. Pain of right heel   3. Coagulation defect [facility])    Plan:  Patient was evaluated and treated and all questions answered.  Ulcer right heel with fat layer exposed-regressing -Debridement as below. -Dressed with Betadine wet-to-dry, DSD. -Continue off-loading with cam boot.  Educated the patient the importance of offloading this heel.  If this gets worse patient is a high risk for limb loss given the nature of the wound.  Patient maintains understanding and will aggressively offload with a cam boot. -Today will be the third application of tides medical graft.  2 cm x 2 cm times medical graft was used to cover the wound that was measured below.  Application of synthetic skin graft/substitute  Name: Tides Medical Graft  Usage: 2 x 2 cm Graft was applied directly to the listed above wound site and secured with Adaptic, kerlix, Ace bandage. Waste: No graft material was wasted and was applied in its entirety.  Procedure: Excisional Debridement of Wound Rationale: Removal of non-viable soft tissue from the wound to promote healing.  Anesthesia: none Pre-Debridement Wound Measurements: 1.5 cm x 1.5 cm x 0.3 cm Post-Debridement Wound Measurements: same Type of Debridement: Sharp Excisional Tissue Removed: Non-viable soft tissue Depth of Debridement: subcutaneous tissue. Technique: Sharp excisional debridement to bleeding, viable wound base.  Dressing: Dry, sterile, compression dressing. Disposition: Patient tolerated procedure well. Patient to return in 1 week for follow-up.   [name], DPM    No follow-ups on file.   No follow-ups on file.

## 2020-03-05 ENCOUNTER — Encounter: Payer: Self-pay | Admitting: Cardiology

## 2020-03-11 ENCOUNTER — Other Ambulatory Visit: Payer: Self-pay

## 2020-03-11 ENCOUNTER — Ambulatory Visit (INDEPENDENT_AMBULATORY_CARE_PROVIDER_SITE_OTHER): Payer: Medicare HMO | Admitting: Podiatry

## 2020-03-11 DIAGNOSIS — L97412 Non-pressure chronic ulcer of right heel and midfoot with fat layer exposed: Secondary | ICD-10-CM | POA: Diagnosis not present

## 2020-03-11 DIAGNOSIS — D689 Coagulation defect, unspecified: Secondary | ICD-10-CM | POA: Diagnosis not present

## 2020-03-14 ENCOUNTER — Encounter: Payer: Self-pay | Admitting: Podiatry

## 2020-03-14 NOTE — Progress Notes (Addendum)
Subjective:  Patient ID: [name] [name], male    DOB: 07/19/1923,  MRN: [id number]  Chief Complaint  Patient presents with  . Wound Check    Right plantar heel ulcer check. Pt states he has no concerns.    86 y.o. male presents for wound care.  Patient is follow-up of the ulcer of the right heel.  Patient states that he is doing a little bit better.  Patient is here for a graft application this will be his third application.  Patient shows no clinical signs of infection.  Patient states that it hurts to walk on.  He denies any other acute complaints.  They have been keeping clean dry and intact and well bandaged.    Review of Systems: Negative except as noted in the HPI. Denies N/V/F/Ch.  Past Medical History:  Diagnosis Date  . AAA (abdominal aortic aneurysm) without rupture ([place])    10/2012 CT scan: 4.3x4.6cm  . Arthritis    "legs are stiff" , had spinal injections for pain relieve 3-4 yrs. ago  . Atrial fibrillation-permanent   . Bradycardia   . Cataract   . CHF (congestive heart failure) ([place])   . Constipation   . Depression   . Diverticulosis   . GERD (gastroesophageal reflux disease)   . Hypertension   . Nonischemic/ ischemic cardiomyopathy    Single-vessel coronary disease catheterization 2004-Myoview showing inferior wall perfusion defect Myoview 2006  . Recurrent upper respiratory infection (URI)    recent cold- treating /w OTC  . Subdural hematoma (HCC)    Prior evacuation    Current Outpatient Medications:  .  carvedilol (COREG) 3.125 MG tablet, Take 1 tablet (3.125 mg total) by mouth 2 (two) times daily with a meal., Disp: 60 tablet, Rfl: 3 .  clopidogrel (PLAVIX) 75 MG tablet, Take 75 mg by mouth. Pt taking half of 75 mg daily, Disp: , Rfl:  .  diclofenac sodium (VOLTAREN) 1 % GEL, APPLY 4 GRAMS TO THE AFFECTED AREAS TOPICALLY FOUR TIMES PER DAY, Disp: , Rfl:  .  docusate sodium (COLACE) 100 MG capsule, Take 1 capsule (100 mg total) by mouth daily as needed for  mild constipation., Disp: 10 capsule, Rfl: 0 .  doxycycline (VIBRAMYCIN) 100 MG capsule, , Disp: , Rfl:  .  GAVILAX 17 GM/SCOOP powder, TAKE ONE CAPFUL (17 GRAMS) , MIX WITH 8 OUNCES OF WATER,JUICE,SODA,COFFEE OR TEA AND DRINK DAILY, Disp: , Rfl:  .  mirtazapine (REMERON) 15 MG tablet, , Disp: , Rfl:  .  mirtazapine (REMERON) 30 MG tablet, , Disp: , Rfl:  .  pantoprazole (PROTONIX) 40 MG tablet, Take 40 mg by mouth 2 (two) times daily., Disp: , Rfl:  .  Probiotic Product (PROBIOTIC DAILY PO), Take 1 capsule by mouth daily. Reported on 03/28/2016, Disp: , Rfl:  .  ramipril (ALTACE) 1.25 MG capsule, Take 1 capsule (1.25 mg total) by mouth daily., Disp: 30 capsule, Rfl: 3 .  Vitamin D, Ergocalciferol, (DRISDOL) 1.25 MG (50000 UT) CAPS capsule, Take 50,000 Units by mouth once a week., Disp: , Rfl:   Social History   Tobacco Use  Smoking Status Former Smoker  . Packs/day: 0.25  . Years: 15.00  . Pack years: 3.75  . Quit date: 12/24/1994  . Years since quitting: 25.2  Smokeless Tobacco Never Used    No Known Allergies Objective:  There were no vitals filed for this visit. There is no height or weight on file to calculate BMI. Constitutional Well developed. Well nourished.  Vascular  Dorsalis pedis pulses palpable bilaterally. Posterior tibial pulses palpable bilaterally. Capillary refill normal to all digits.  No cyanosis or clubbing noted. Pedal hair growth normal.  Neurologic Normal speech. Oriented to person, place, and time. Protective sensation absent  Dermatologic Wound Location: R heel ulceration  improving. Wound Base: Mixed Granular/Fibrotic Peri-wound: Normal Exudate: Scant/small amount Serous exudate Wound Measurements: -See below Pt has thick disfigured discolored nails with subungual debris noted bilateral entire nail hallux through fifth toenails  Orthopedic: No pain to palpation either foot.   Radiographs: None Assessment:   1. Heel ulcer, right, with fat layer  exposed ([place])   2. Coagulation defect ([place])    Plan:  Patient was evaluated and treated and all questions answered.  Ulcer right heel with fat layer exposed-regressing -Debridement as below. -Dressed with Betadine wet-to-dry, DSD. -Continue off-loading with cam boot.  Educated the patient the importance of offloading this heel.  If this gets worse patient is a high risk for limb loss given the nature of the wound.  Patient maintains understanding and will aggressively offload with a cam boot. -Today will be the fourth application of tides medical graft.  2 cm x 2 cm times medical graft was used to cover the wound that was measured below.  Application of synthetic skin graft/substitute  Name: Tides Medical Graft  Usage: 2 x 2 cm Graft was applied directly to the listed above wound site and secured with Adaptic, kerlix, Ace bandage. Waste: No graft material was wasted and was applied in its entirety.  Procedure: Excisional Debridement of Wound Rationale: Removal of non-viable soft tissue from the wound to promote healing.  Anesthesia: none Pre-Debridement Wound Measurements: 0.8 x 0.8 x 0.2 cm Post-Debridement Wound Measurements: same Type of Debridement: Sharp Excisional Tissue Removed: Non-viable soft tissue Depth of Debridement: subcutaneous tissue. Technique: Sharp excisional debridement to bleeding, viable wound base.  Dressing: Dry, sterile, compression dressing. Disposition: Patient tolerated procedure well. Patient to return in 1 week for follow-up.   [name], DPM    No follow-ups on file.   No follow-ups on file.

## 2020-03-21 ENCOUNTER — Ambulatory Visit (INDEPENDENT_AMBULATORY_CARE_PROVIDER_SITE_OTHER): Payer: Medicare HMO | Admitting: Podiatry

## 2020-03-21 ENCOUNTER — Other Ambulatory Visit: Payer: Self-pay

## 2020-03-21 DIAGNOSIS — D689 Coagulation defect, unspecified: Secondary | ICD-10-CM

## 2020-03-21 DIAGNOSIS — L97412 Non-pressure chronic ulcer of right heel and midfoot with fat layer exposed: Secondary | ICD-10-CM | POA: Diagnosis not present

## 2020-03-22 ENCOUNTER — Encounter: Payer: Self-pay | Admitting: Podiatry

## 2020-03-22 ENCOUNTER — Telehealth: Payer: Self-pay | Admitting: Podiatry

## 2020-03-22 NOTE — Progress Notes (Signed)
Subjective:  Patient ID: [name] [name], male    DOB: [birth year],  MRN: [id number]  Chief Complaint  Patient presents with  . Foot Pain    pt is here for a possible skin graft change of the right foot, pt is doing a lot better withb no signs of infection.    86 y.o. male presents for wound care.  Patient is follow-up of the ulcer of the right heel.  Patient states he is a lot better.  He thinks that the wound has completely healed.  Him he denies any other acute complaints.  Today might be the last visit for his wound application.  Review of Systems: Negative except as noted in the HPI. Denies N/V/F/Ch.  Past Medical History:  Diagnosis Date  . AAA (abdominal aortic aneurysm) without rupture ([place])    10/2012 CT scan: 4.3x4.6cm  . Arthritis    "legs are stiff" , had spinal injections for pain relieve 3-4 yrs. ago  . Atrial fibrillation-permanent   . Bradycardia   . Cataract   . CHF (congestive heart failure) ([place])   . Constipation   . Depression   . Diverticulosis   . GERD (gastroesophageal reflux disease)   . Hypertension   . Nonischemic/ ischemic cardiomyopathy    Single-vessel coronary disease catheterization 2004-Myoview showing inferior wall perfusion defect Myoview 2006  . Recurrent upper respiratory infection (URI)    recent cold- treating /w OTC  . Subdural hematoma (HCC)    Prior evacuation    Current Outpatient Medications:  .  carvedilol (COREG) 3.125 MG tablet, Take 1 tablet (3.125 mg total) by mouth 2 (two) times daily with a meal., Disp: 60 tablet, Rfl: 3 .  clopidogrel (PLAVIX) 75 MG tablet, Take 75 mg by mouth. Pt taking half of 75 mg daily, Disp: , Rfl:  .  diclofenac sodium (VOLTAREN) 1 % GEL, APPLY 4 GRAMS TO THE AFFECTED AREAS TOPICALLY FOUR TIMES PER DAY, Disp: , Rfl:  .  docusate sodium (COLACE) 100 MG capsule, Take 1 capsule (100 mg total) by mouth daily as needed for mild constipation., Disp: 10 capsule, Rfl: 0 .  doxycycline (VIBRAMYCIN) 100 MG  capsule, , Disp: , Rfl:  .  GAVILAX 17 GM/SCOOP powder, TAKE ONE CAPFUL (17 GRAMS) , MIX WITH 8 OUNCES OF WATER,JUICE,SODA,COFFEE OR TEA AND DRINK DAILY, Disp: , Rfl:  .  mirtazapine (REMERON) 15 MG tablet, , Disp: , Rfl:  .  mirtazapine (REMERON) 30 MG tablet, , Disp: , Rfl:  .  pantoprazole (PROTONIX) 40 MG tablet, Take 40 mg by mouth 2 (two) times daily., Disp: , Rfl:  .  Probiotic Product (PROBIOTIC DAILY PO), Take 1 capsule by mouth daily. Reported on 03/28/2016, Disp: , Rfl:  .  ramipril (ALTACE) 1.25 MG capsule, Take 1 capsule (1.25 mg total) by mouth daily., Disp: 30 capsule, Rfl: 3 .  Vitamin D, Ergocalciferol, (DRISDOL) 1.25 MG (50000 UT) CAPS capsule, Take 50,000 Units by mouth once a week., Disp: , Rfl:   Social History   Tobacco Use  Smoking Status Former Smoker  . Packs/day: 0.25  . Years: 15.00  . Pack years: 3.75  . Quit date: 12/24/1994  . Years since quitting: 25.2  Smokeless Tobacco Never Used    No Known Allergies Objective:  There were no vitals filed for this visit. There is no height or weight on file to calculate BMI. Constitutional Well developed. Well nourished.  Vascular Dorsalis pedis pulses palpable bilaterally. Posterior tibial pulses palpable bilaterally. Capillary refill normal to  all digits.  No cyanosis or clubbing noted. Pedal hair growth normal.  Neurologic Normal speech. Oriented to person, place, and time. Protective sensation absent  Dermatologic Wound Location: Right heel ulceration completely reepithelialized Hyperkeratotic lesion was debrided with a chisel and no further wound was noted. Pt has thick disfigured discolored nails with subungual debris noted bilateral entire nail hallux through fifth toenails  Orthopedic: No pain to palpation either foot.   Radiographs: None Assessment:   1. Heel ulcer, right, with fat layer exposed ([place])   2. Coagulation defect ([place])    Plan:  Patient was evaluated and treated and all questions  answered.  Ulcer right heel with fat layer exposed~resolved -Patient can start transitioning away from cam boot and into regular sneakers with soft insoles as the wound has completely epithelialized. -No further past medical graft was applied as the wound has completely reepithelialized.  I have asked the patient to come back and see me as needed if the wound ever returns.[name], DPM    No follow-ups on file.   No follow-ups on file.

## 2020-03-22 NOTE — Telephone Encounter (Signed)
What are his options for shoes? I can definitely give him prescription if needed. He will benefit from supporting that heel.   Thanks  Lennette Bihari

## 2020-03-22 NOTE — Telephone Encounter (Signed)
pts daughter called asking about getting pt some shoes to help with preventing the ulcers. She said you mentioned it to them at the appt. Pt is not diabetic so [employer] will not cover shoes. Did you want to give pt a rx to see if they could go somewhere else to get the shoes?

## 2020-10-15 ENCOUNTER — Emergency Department (HOSPITAL_COMMUNITY): Payer: Medicare HMO

## 2020-10-15 ENCOUNTER — Inpatient Hospital Stay (HOSPITAL_COMMUNITY)
Admission: EM | Admit: 2020-10-15 | Discharge: 2020-10-28 | DRG: 963 | Disposition: A | Discharge status: Skilled Nursing Facility | Payer: Medicare HMO | Attending: Family Medicine | Admitting: Family Medicine

## 2020-10-15 ENCOUNTER — Other Ambulatory Visit: Payer: Self-pay

## 2020-10-15 DIAGNOSIS — Z79899 Other long term (current) drug therapy: Secondary | ICD-10-CM

## 2020-10-15 DIAGNOSIS — Z961 Presence of intraocular lens: Secondary | ICD-10-CM | POA: Diagnosis present

## 2020-10-15 DIAGNOSIS — S43011A Anterior subluxation of right humerus, initial encounter: Secondary | ICD-10-CM | POA: Diagnosis not present

## 2020-10-15 DIAGNOSIS — F32A Depression, unspecified: Secondary | ICD-10-CM | POA: Diagnosis present

## 2020-10-15 DIAGNOSIS — R2689 Other abnormalities of gait and mobility: Secondary | ICD-10-CM | POA: Diagnosis not present

## 2020-10-15 DIAGNOSIS — J309 Allergic rhinitis, unspecified: Secondary | ICD-10-CM | POA: Diagnosis present

## 2020-10-15 DIAGNOSIS — I130 Hypertensive heart and chronic kidney disease with heart failure and stage 1 through stage 4 chronic kidney disease, or unspecified chronic kidney disease: Secondary | ICD-10-CM | POA: Diagnosis present

## 2020-10-15 DIAGNOSIS — I959 Hypotension, unspecified: Secondary | ICD-10-CM | POA: Diagnosis not present

## 2020-10-15 DIAGNOSIS — I729 Aneurysm of unspecified site: Secondary | ICD-10-CM

## 2020-10-15 DIAGNOSIS — G301 Alzheimer's disease with late onset: Secondary | ICD-10-CM | POA: Diagnosis not present

## 2020-10-15 DIAGNOSIS — K5909 Other constipation: Secondary | ICD-10-CM | POA: Diagnosis present

## 2020-10-15 DIAGNOSIS — E43 Unspecified severe protein-calorie malnutrition: Secondary | ICD-10-CM | POA: Diagnosis present

## 2020-10-15 DIAGNOSIS — Z66 Do not resuscitate: Secondary | ICD-10-CM | POA: Diagnosis present

## 2020-10-15 DIAGNOSIS — S43011D Anterior subluxation of right humerus, subsequent encounter: Secondary | ICD-10-CM | POA: Diagnosis not present

## 2020-10-15 DIAGNOSIS — I4891 Unspecified atrial fibrillation: Secondary | ICD-10-CM | POA: Diagnosis not present

## 2020-10-15 DIAGNOSIS — F0390 Unspecified dementia without behavioral disturbance: Secondary | ICD-10-CM

## 2020-10-15 DIAGNOSIS — K219 Gastro-esophageal reflux disease without esophagitis: Secondary | ICD-10-CM | POA: Diagnosis present

## 2020-10-15 DIAGNOSIS — D509 Iron deficiency anemia, unspecified: Secondary | ICD-10-CM | POA: Diagnosis present

## 2020-10-15 DIAGNOSIS — T148XXA Other injury of unspecified body region, initial encounter: Secondary | ICD-10-CM

## 2020-10-15 DIAGNOSIS — F0280 Dementia in other diseases classified elsewhere without behavioral disturbance: Secondary | ICD-10-CM | POA: Diagnosis not present

## 2020-10-15 DIAGNOSIS — Z602 Problems related to living alone: Secondary | ICD-10-CM | POA: Diagnosis not present

## 2020-10-15 DIAGNOSIS — Z681 Body mass index (BMI) 19 or less, adult: Secondary | ICD-10-CM

## 2020-10-15 DIAGNOSIS — N1831 Chronic kidney disease, stage 3a: Secondary | ICD-10-CM | POA: Diagnosis not present

## 2020-10-15 DIAGNOSIS — L89152 Pressure ulcer of sacral region, stage 2: Secondary | ICD-10-CM | POA: Diagnosis not present

## 2020-10-15 DIAGNOSIS — Z7189 Other specified counseling: Secondary | ICD-10-CM

## 2020-10-15 DIAGNOSIS — Z9842 Cataract extraction status, left eye: Secondary | ICD-10-CM

## 2020-10-15 DIAGNOSIS — I5022 Chronic systolic (congestive) heart failure: Secondary | ICD-10-CM | POA: Diagnosis present

## 2020-10-15 DIAGNOSIS — Z515 Encounter for palliative care: Secondary | ICD-10-CM

## 2020-10-15 DIAGNOSIS — I10 Essential (primary) hypertension: Secondary | ICD-10-CM | POA: Diagnosis not present

## 2020-10-15 DIAGNOSIS — I714 Abdominal aortic aneurysm, without rupture, unspecified: Secondary | ICD-10-CM | POA: Diagnosis present

## 2020-10-15 DIAGNOSIS — N183 Chronic kidney disease, stage 3 unspecified: Secondary | ICD-10-CM | POA: Diagnosis present

## 2020-10-15 DIAGNOSIS — I255 Ischemic cardiomyopathy: Secondary | ICD-10-CM | POA: Diagnosis present

## 2020-10-15 DIAGNOSIS — Z950 Presence of cardiac pacemaker: Secondary | ICD-10-CM | POA: Diagnosis present

## 2020-10-15 DIAGNOSIS — W19XXXA Unspecified fall, initial encounter: Secondary | ICD-10-CM | POA: Diagnosis present

## 2020-10-15 DIAGNOSIS — S065X9A Traumatic subdural hemorrhage with loss of consciousness of unspecified duration, initial encounter: Principal | ICD-10-CM | POA: Diagnosis present

## 2020-10-15 DIAGNOSIS — M1990 Unspecified osteoarthritis, unspecified site: Secondary | ICD-10-CM | POA: Diagnosis present

## 2020-10-15 DIAGNOSIS — Z7902 Long term (current) use of antithrombotics/antiplatelets: Secondary | ICD-10-CM

## 2020-10-15 DIAGNOSIS — Z20822 Contact with and (suspected) exposure to covid-19: Secondary | ICD-10-CM | POA: Diagnosis present

## 2020-10-15 DIAGNOSIS — R748 Abnormal levels of other serum enzymes: Secondary | ICD-10-CM | POA: Diagnosis not present

## 2020-10-15 DIAGNOSIS — I4821 Permanent atrial fibrillation: Secondary | ICD-10-CM | POA: Diagnosis present

## 2020-10-15 DIAGNOSIS — M12811 Other specific arthropathies, not elsewhere classified, right shoulder: Secondary | ICD-10-CM

## 2020-10-15 DIAGNOSIS — S065XAA Traumatic subdural hemorrhage with loss of consciousness status unknown, initial encounter: Secondary | ICD-10-CM

## 2020-10-15 DIAGNOSIS — S43013A Anterior subluxation of unspecified humerus, initial encounter: Secondary | ICD-10-CM | POA: Diagnosis present

## 2020-10-15 DIAGNOSIS — T796XXA Traumatic ischemia of muscle, initial encounter: Secondary | ICD-10-CM | POA: Diagnosis present

## 2020-10-15 DIAGNOSIS — M75101 Unspecified rotator cuff tear or rupture of right shoulder, not specified as traumatic: Secondary | ICD-10-CM | POA: Diagnosis present

## 2020-10-15 DIAGNOSIS — L8990 Pressure ulcer of unspecified site, unspecified stage: Secondary | ICD-10-CM | POA: Insufficient documentation

## 2020-10-15 DIAGNOSIS — Z87891 Personal history of nicotine dependence: Secondary | ICD-10-CM

## 2020-10-15 DIAGNOSIS — Z23 Encounter for immunization: Secondary | ICD-10-CM

## 2020-10-15 DIAGNOSIS — Z9841 Cataract extraction status, right eye: Secondary | ICD-10-CM

## 2020-10-15 LAB — CBC WITH DIFFERENTIAL/PLATELET
Abs Immature Granulocytes: 0.03 10*3/uL (ref 0.00–0.07)
Basophils Absolute: 0 10*3/uL (ref 0.0–0.1)
Basophils Relative: 0 %
Eosinophils Absolute: 0 10*3/uL (ref 0.0–0.5)
Eosinophils Relative: 0 %
HCT: 33.1 % — ABNORMAL LOW (ref 39.0–52.0)
Hemoglobin: 10.8 g/dL — ABNORMAL LOW (ref 13.0–17.0)
Immature Granulocytes: 1 %
Lymphocytes Relative: 10 %
Lymphs Abs: 0.6 10*3/uL — ABNORMAL LOW (ref 0.7–4.0)
MCH: 24.4 pg — ABNORMAL LOW (ref 26.0–34.0)
MCHC: 32.6 g/dL (ref 30.0–36.0)
MCV: 74.7 fL — ABNORMAL LOW (ref 80.0–100.0)
Monocytes Absolute: 0.7 10*3/uL (ref 0.1–1.0)
Monocytes Relative: 11 %
Neutro Abs: 5 10*3/uL (ref 1.7–7.7)
Neutrophils Relative %: 78 %
Platelets: 222 10*3/uL (ref 150–400)
RBC: 4.43 MIL/uL (ref 4.22–5.81)
RDW: 17.3 % — ABNORMAL HIGH (ref 11.5–15.5)
WBC: 6.3 10*3/uL (ref 4.0–10.5)
nRBC: 0 % (ref 0.0–0.2)

## 2020-10-15 LAB — COMPREHENSIVE METABOLIC PANEL
ALT: 13 U/L (ref 0–44)
AST: 28 U/L (ref 15–41)
Albumin: 2.9 g/dL — ABNORMAL LOW (ref 3.5–5.0)
Alkaline Phosphatase: 48 U/L (ref 38–126)
Anion gap: 10 (ref 5–15)
BUN: 21 mg/dL (ref 8–23)
CO2: 19 mmol/L — ABNORMAL LOW (ref 22–32)
Calcium: 8.5 mg/dL — ABNORMAL LOW (ref 8.9–10.3)
Chloride: 107 mmol/L (ref 98–111)
Creatinine, Ser: 1.14 mg/dL (ref 0.61–1.24)
GFR, Estimated: 59 mL/min — ABNORMAL LOW (ref >60)
Glucose, Bld: 109 mg/dL — ABNORMAL HIGH (ref 70–99)
Potassium: 4.6 mmol/L (ref 3.5–5.1)
Sodium: 136 mmol/L (ref 135–145)
Total Bilirubin: 1.3 mg/dL — ABNORMAL HIGH (ref 0.3–1.2)
Total Protein: 6.5 g/dL (ref 6.5–8.1)

## 2020-10-15 LAB — RESPIRATORY PANEL BY RT PCR (FLU A&B, COVID)
Influenza A by PCR: NEGATIVE
Influenza B by PCR: NEGATIVE
SARS Coronavirus 2 by RT PCR: NEGATIVE

## 2020-10-15 LAB — TROPONIN I (HIGH SENSITIVITY)
Troponin I (High Sensitivity): 61 ng/L — ABNORMAL HIGH (ref <18)
Troponin I (High Sensitivity): 57 ng/L — ABNORMAL HIGH (ref <18)

## 2020-10-15 LAB — CK: Total CK: 753 U/L — ABNORMAL HIGH (ref 49–397)

## 2020-10-15 MED ORDER — CARVEDILOL 3.125 MG PO TABS
3.1250 mg | ORAL_TABLET | Freq: Two times a day (BID) | ORAL | Status: DC
  Administered 2020-10-16 – 2020-10-18 (×5): 3.125 mg via ORAL
  Filled 2020-10-15 (×5): qty 1

## 2020-10-15 MED ORDER — TETANUS-DIPHTH-ACELL PERTUSSIS 5-2.5-18.5 LF-MCG/0.5 IM SUSP
0.5000 mL | Freq: Once | INTRAMUSCULAR | Status: AC
  Administered 2020-10-15: 0.5 mL via INTRAMUSCULAR
  Filled 2020-10-15: qty 0.5

## 2020-10-15 MED ORDER — POLYETHYLENE GLYCOL 3350 17 G PO PACK: 17.0000 g | PACK | Freq: Every day | ORAL | Status: DC | PRN

## 2020-10-15 MED ORDER — SODIUM CHLORIDE 0.9 % IV BOLUS
500.0000 mL | Freq: Once | INTRAVENOUS | Status: AC
  Administered 2020-10-15: 500 mL via INTRAVENOUS

## 2020-10-15 MED ORDER — RAMIPRIL 1.25 MG PO CAPS
1.2500 mg | ORAL_CAPSULE | Freq: Every day | ORAL | Status: DC
  Filled 2020-10-15 (×2): qty 1

## 2020-10-15 MED ORDER — ACETAMINOPHEN 325 MG PO TABS: 650.0000 mg | ORAL_TABLET | Freq: Four times a day (QID) | ORAL | Status: DC | PRN

## 2020-10-15 MED ORDER — ACETAMINOPHEN 650 MG RE SUPP: 650.0000 mg | Freq: Four times a day (QID) | RECTAL | Status: DC | PRN

## 2020-10-15 MED ORDER — MIRTAZAPINE 30 MG PO TABS
30.0000 mg | ORAL_TABLET | Freq: Every day | ORAL | Status: DC
  Administered 2020-10-15 – 2020-10-28 (×14): 30 mg via ORAL
  Filled 2020-10-15 (×4): qty 1
  Filled 2020-10-15: qty 2
  Filled 2020-10-15: qty 1
  Filled 2020-10-15: qty 2
  Filled 2020-10-15: qty 1
  Filled 2020-10-15: qty 2
  Filled 2020-10-15 (×2): qty 1
  Filled 2020-10-15 (×2): qty 2
  Filled 2020-10-15: qty 1
  Filled 2020-10-15: qty 2
  Filled 2020-10-15 (×6): qty 1
  Filled 2020-10-15 (×4): qty 2

## 2020-10-15 MED ORDER — SODIUM CHLORIDE 0.9 % IV SOLN: INTRAVENOUS | Status: DC

## 2020-10-15 NOTE — ED Triage Notes (Signed)
Pt bib ems from home after pt found on floor of his house by caretaker. Pt fell last night approx 2230 and was unable to get back up. Pt found approx 1300 today. Pt states no LOC. Edema noted to R eye with associated bruising. Pt with skin tear to R shoulder and R knee. Pt also with bruising to R hand and R shoulder. Pt with hx dementia, at baseline neuro per ems. Pt able to recall event.  92/55 HR 66 paced RR 16 95% RA CBG 133

## 2020-10-15 NOTE — Progress Notes (Signed)
Called in regards to this patients head ct after sustaining a fall last night. He has an extensive medical history. CT head shows a left sided sdh measuring .7cm in size with mild midline shift. He has quite a bit of brain atrophy as well. Given his age and multiple medical problems going on I would recommend repeat head ct in the morning. No neurosurgical intervention is warranted.

## 2020-10-15 NOTE — ED Provider Notes (Signed)
3:44 PM Care assumed from Dr. Bobby Rumpf.  At time of transfer care, patient is waiting for results of diagnostic work-up prior to likely admission for altered mental status following a prolonged downtime after a fall overnight with worsened mental status from baseline.  Patient also was found to have mild rhabdo and was started on a light bolus and some fluids.  Troponin was found to be elevated 61, will trend.  Patient will have CT imaging of the head, neck, face, and imaging of the shoulder where he was having pain.  Anticipate reassessment and admission after work-up is completed.  Work-up has completed.  Troponin is improving.  Patient has mild anemia worse than prior.  Patient CT head does show a new small subdural with some midline shift.  It is very mild.  I called neurosurgery will see him.  They recommended holding anticoagulation and repeat CT in 24 hours.  They will see the patient.  The x-ray of the shoulder showed a possible subtle nondisplaced proximal humerus fracture and shoulder fracture.  We will place into a sling.  Patient was able to move the shoulder.  He did not have any numbness or tingling or weakness on my evaluation.  We will have him follow-up with orthopedics for this.  Due to his need for repeat head CT, rehydration with the mild rhabdo, elevated troponin, and altered mental status compared to his baseline, he will need admission.  Due to the combination of factors unrelated to his acute injury that neurosurgery will see him for, will call medicine initially for admission.  7:41 PM Our team spoke to orthopedics and they recommended a CT scan to clarify if there is or is not a fracture.  This was ordered.  Patient be admitted for further management.   Clinical Impression: 1. Fall, initial encounter   2. Fall   3. Dementia without behavioral disturbance, unspecified dementia type ([place])   4. Multiple skin tears   5. SDH (subdural hematoma) (HCC)     Disposition:  Admit  This note was prepared with assistance of Dragon voice recognition software. Occasional wrong-word or sound-a-like substitutions may have occurred due to the inherent limitations of voice recognition software.   CRITICAL CARE Performed by: Gwenyth Allegra Lurline Caver Total critical care time: 30 minutes Critical care time was exclusive of separately billable procedures and treating other patients. Critical care was necessary to treat or prevent imminent or life-threatening deterioration. Critical care was time spent personally by me on the following activities: development of treatment plan with patient and/or surrogate as well as nursing, discussions with consultants, evaluation of patient's response to treatment, examination of patient, obtaining history from patient or surrogate, ordering and performing treatments and interventions, ordering and review of laboratory studies, ordering and review of radiographic studies, pulse oximetry and re-evaluation of patient's condition.     Aamari West, Gwenyth Allegra, MD 10/15/20 2250

## 2020-10-15 NOTE — ED Provider Notes (Signed)
[facility] EMERGENCY DEPARTMENT Provider Note   CSN: [id number] Arrival date & time: 10/15/20  1340     History Chief Complaint  Patient presents with  . Fall    [name] [name] is a 86 y.o. male.  Patient brought in by EMS. Patient found on the floor of his house by caretaker. Patient fell last night approximately at 2230 and was unable to get back up. Not sure exactly how they know. Patient fell approximately at the 1:00 today. Patient seems to have some dementia. Not answering questions very completely. There are some edema around the right I am bruising there is a skin tear to the right shoulder and bilateral knee area. Also a small 1 to the left hand. Patient has a history of dementia at baseline neuro per EMS. But patient unable to recall events.  Patient's rhythm is a paced rhythm. Patient has a pacemaker left anterior chest. History of congestive heart failure abdominal aortic aneurysm without rupture. Hypertension nonischemic and ischemic cardiomyopathy. And a history of a subdural hematoma.  Patient's blood pressure very high on presentation. 164/146. It looks like patient supposed to be on Coreg and Altace.        Past Medical History:  Diagnosis Date  . AAA (abdominal aortic aneurysm) without rupture ([place])    10/2012 CT scan: 4.3x4.6cm  . Arthritis    "legs are stiff" , had spinal injections for pain relieve 3-4 yrs. ago  . Atrial fibrillation-permanent   . Bradycardia   . Cataract   . CHF (congestive heart failure) ([place])   . Constipation   . Depression   . Diverticulosis   . GERD (gastroesophageal reflux disease)   . Hypertension   . Nonischemic/ ischemic cardiomyopathy    Single-vessel coronary disease catheterization 2004-Myoview showing inferior wall perfusion defect Myoview 2006  . Recurrent upper respiratory infection (URI)    recent cold- treating /w OTC  . Subdural hematoma [facility])    Prior evacuation    Patient Active Problem List    Diagnosis Date Noted  . Impacted cerumen of left ear 07/28/2019  . AAA (abdominal aortic aneurysm) without rupture ([place]) 06/15/2015  . Constipation 04/30/2014  . Knee problem 04/30/2014  . Back pain 03/21/2014  . Enlarged prostate 03/21/2014  . GERD (gastroesophageal reflux disease) 03/21/2014  . Protein-calorie malnutrition, severe ([place]) 12/08/2013  . Chest pain 12/07/2013  . Pacemaker-[employer] 08/26/2012  . Nonischemic/ ischemic cardiomyopathy   . Bradycardia   . Atrial fibrillation-permanent     Past Surgical History:  Procedure Laterality Date  . BRAIN SURGERY     2002  . CATARACT EXTRACTION W/PHACO  01/30/2012   Procedure: CATARACT EXTRACTION PHACO AND INTRAOCULAR LENS PLACEMENT (IOC);  Surgeon: Adonis Brook, MD;  Location: [facility];  Service: Ophthalmology;  Laterality: Right;  . evacuation of subdural hematoma    . EYE SURGERY     cataract extracted in L eye  . GAS/FLUID EXCHANGE  07/30/2012   Procedure: GAS/FLUID EXCHANGE;  Surgeon: Adonis Brook, MD;  Location: [facility];  Service: Ophthalmology;  Laterality: Right;  . HERNIA REPAIR     R side inguinal repair   . PACEMAKER INSERTION  08/26/12   SJM Accent SR RF pacemaker  . PARS PLANA VITRECTOMY  07/30/2012   Procedure: PARS PLANA VITRECTOMY WITH 23 GAUGE;  Surgeon: Adonis Brook, MD;  Location: [facility];  Service: Ophthalmology;  Laterality: Right;  Insertion of Silicone Oil  . PERMANENT PACEMAKER INSERTION N/A 08/26/2012   Procedure: PERMANENT PACEMAKER INSERTION;  Surgeon: Thompson Grayer, MD;  Location: [facility] CATH LAB;  Service: Cardiovascular;  Laterality: N/A;  . TONSILLECTOMY     1957       Family History  Problem Relation Age of Onset  . Anesthesia problems Neg Hx   . Hypotension Neg Hx   . Malignant hyperthermia Neg Hx   . Pseudochol deficiency Neg Hx     Social History   Tobacco Use  . Smoking status: Former Smoker    Packs/day: 0.25    Years: 15.00    Pack years: 3.75    Quit date: 12/24/1994    Years since quitting:  25.8  . Smokeless tobacco: Never Used  Vaping Use  . Vaping Use: Never used  Substance Use Topics  . Alcohol use: No  . Drug use: No    Home Medications Prior to Admission medications   Medication Sig Start Date End Date Taking? Authorizing Provider  carvedilol (COREG) 3.125 MG tablet Take 1 tablet (3.125 mg total) by mouth 2 (two) times daily with a meal. 12/10/13   Charolette Forward, MD  clopidogrel (PLAVIX) 75 MG tablet Take 75 mg by mouth. Pt taking half of 75 mg daily    [provider]  diclofenac sodium (VOLTAREN) 1 % GEL APPLY 4 GRAMS TO THE AFFECTED AREAS TOPICALLY FOUR TIMES PER DAY 09/11/19   [provider]  docusate sodium (COLACE) 100 MG capsule Take 1 capsule (100 mg total) by mouth daily as needed for mild constipation. 08/11/14   Leone Haven, MD  doxycycline (VIBRAMYCIN) 100 MG capsule  12/08/19   [provider]  GAVILAX 17 GM/SCOOP powder TAKE ONE CAPFUL (17 GRAMS) , MIX WITH 8 OUNCES OF WATER,JUICE,SODA,COFFEE OR TEA AND DRINK DAILY 09/11/19   [provider]  mirtazapine (REMERON) 15 MG tablet  08/14/19   [provider]  mirtazapine (REMERON) 30 MG tablet  01/28/20   [provider]  pantoprazole (PROTONIX) 40 MG tablet Take 40 mg by mouth 2 (two) times daily.    [provider]  Probiotic Product (PROBIOTIC DAILY PO) Take 1 capsule by mouth daily. Reported on 03/28/2016    [provider]  ramipril (ALTACE) 1.25 MG capsule Take 1 capsule (1.25 mg total) by mouth daily. 12/10/13   Charolette Forward, MD  Vitamin D, Ergocalciferol, (DRISDOL) 1.25 MG (50000 UT) CAPS capsule Take 50,000 Units by mouth once a week. 07/07/19   [provider]    Allergies    Patient has no known allergies.  Review of Systems   Review of Systems  Unable to perform ROS: Dementia    Physical Exam Updated Vital Signs BP (!) 174/141 (BP Location: Right Arm)   Pulse 64   Temp 98.4 F (36.9 C) (Oral)   Resp 15    Ht 1.854 m (6\' 1" )   Wt 64 kg   SpO2 91%   BMI 18.62 kg/m   Physical Exam Vitals and nursing note reviewed.  Constitutional:      Appearance: He is well-developed.     Comments: Patient very frail.  HENT:     Head: Normocephalic.     Comments: Bruising some edema and erythema around the right eye. Eyes:     Extraocular Movements: Extraocular movements intact.     Conjunctiva/sclera: Conjunctivae normal.     Pupils: Pupils are equal, round, and reactive to light.  Neck:     Comments: C-collar in place. Cardiovascular:     Rate and Rhythm: Normal rate and regular rhythm.  Heart sounds: No murmur heard.   Pulmonary:     Effort: Pulmonary effort is normal. No respiratory distress.     Breath sounds: Normal breath sounds.  Abdominal:     Palpations: Abdomen is soft.     Tenderness: There is no abdominal tenderness.  Musculoskeletal:     Comments: Radial pulse 1+ bilaterally dorsalis pedis pulse 1+ bilaterally.  No pain with elevation of both lower extremities.  Both knees left knee with an abrasion medially and is right knee with a skin tear laterally.  Also with a skin tear to the right anterior shoulder.  Patient with pain with range of motion of the right upper extremity at the shoulder area.  No obvious deformity.  Skin:    General: Skin is warm and dry.  Neurological:     Mental Status: He is alert.     Comments: Patient with some movement of all 4 extremities but not necessarily to command but if you pick him up he is able to hold them up lower extremities and upper extremities.  Patient alert.  But not able to follow commands very well.  Not very vocal.     ED Results / Procedures / Treatments   Labs (all labs ordered are listed, but only abnormal results are displayed) Labs Reviewed  RESPIRATORY PANEL BY RT PCR (FLU A&B, COVID)  CBC WITH DIFFERENTIAL/PLATELET  COMPREHENSIVE METABOLIC PANEL  URINALYSIS, ROUTINE W REFLEX MICROSCOPIC  CK  TROPONIN I (HIGH  SENSITIVITY)    EKG EKG Interpretation  Date/Time:  Saturday October 15 2020 13:53:04 EDT Ventricular Rate:  61 PR Interval:    QRS Duration: 186 QT Interval:  494 QTC Calculation: 498 R Axis:   -89 Text Interpretation: VENTRICULAR PACED RHYTHM Confirmed by Fredia Sorrow [phone]) on 10/15/2020 2:33:37 PM   Radiology No results found.  Procedures Procedures (including critical care time)  Medications Ordered in ED Medications  0.9 %  sodium chloride infusion (has no administration in time range)  sodium chloride 0.9 % bolus 500 mL (has no administration in time range)  carvedilol (COREG) tablet 3.125 mg (has no administration in time range)  ramipril (ALTACE) capsule 1.25 mg (has no administration in time range)  Tdap (BOOSTRIX) injection 0.5 mL (0.5 mLs Intramuscular Given 10/15/20 1437)    ED Course  I have reviewed the triage vital signs and the nursing notes.  Pertinent labs & imaging results that were available during my care of the patient were reviewed by me and considered in my medical decision making (see chart for details).    MDM Rules/Calculators/A&P                          Patient with a fall at home probably occurred overnight.  Will check CK.  Will get labs.  Cardiac monitoring shows that he is got a ventricular paced rhythm.  EKG suggestive that as well.  No obvious deformity or pain other than around the right eye and the right upper arm area.  Not certain about tetanus status so we will update it.  We will get CT head neck and maxillofacial.  Chest x-ray x-ray of right shoulder and bilateral knees and hips and pelvis.  No apparent back tenderness with elevation of the legs.   Patient's blood pressure is markedly high.  Will verify that he is supposed to be on Coreg and Altace.  And will probably order those because he probably did not have any that this  morning.   Final Clinical Impression(s) / ED Diagnoses Final diagnoses:  Fall, initial  encounter  Dementia without behavioral disturbance, unspecified dementia type ([place])  Multiple skin tears    Rx / DC Orders ED Discharge Orders    None       Fredia Sorrow, MD 10/15/20 1442

## 2020-10-15 NOTE — Progress Notes (Signed)
Orthopedic Tech Progress Note Patient Details:  [name] [name] 12-Feb-1923 [id number]  Ortho Devices Ortho Device/Splint Location: sling RUE Ortho Device/Splint Interventions: Ordered, Application   Post Interventions Patient Tolerated: Well Instructions Provided: Care of device   Braulio Bosch 10/15/2020, 5:47 PM

## 2020-10-15 NOTE — H&P (Addendum)
[facility] Admission History and Physical Service Pager: [phone]  Patient name: [name] [name] Medical record number: [id number] Date of birth: 03/07/1923 Age: 86 y.o. Gender: male  Primary Care Provider: Sonia Side., FNP Consultants: None  Code Status: Full Code Preferred Emergency Contact: Henderson Newcomer, [phone]  Chief Complaint: fall with subdural hematoma and rhabdomyolysis   Assessment and Plan: [name] [name] is a 86 y.o. male presenting with elevated CK and subdural hematoma after being found down after fall while home alone. PMH is significant for permanent atrial fibrillation, indwelling pacemaker, protein calorie malnutrition, hypertension, CHFrEF 25-30%, dementia.   Rhabdomyolysis after fall and prolonged time down  Patient admitted after fall in which patient was down for ~13 hours prior to getting assistance from his home care taker. CK on admission elevated at 753, Cr mildly elevated at 1.14. Patient was not able to access fluids while down and last meal was on the day prior to his fall. Patient uses walker to mobilize around home and has Soft tissue changes on right UE without evidence of fracture, patient has multiple abrasions on right upper and lower extremities. Xrays of bilateral knees showed no fractures, OA and small effusion of right knee that correlates with gross swelling on clinical exam.  - admit to inpatient, progressive, attending Dr. McDiarmid  - continue IV fluids NS  - monitor Cr and CK daily  - PT/OT  - recommend considering palliative consult   Subdural Hematoma, acute   AMS, resolved  Hx of Dementia  Patient Sustained subdural hematoma during fall overnight. Head CT on admission shows a 0.7 cm low-density subdural collection along the left cerebral convexity. Mild mass effect with approximately 3w mm of rightward midline shift. Family expressed concern that patient was not mentating at his baseline.  CT with no  other significant findings outside of subdural hematoma.  Baseline mental status includes coherent conversation with occasional tangential speech, daughter states that patient often remembers details very clearly.  Made decision to live at home alone, per daughter.  No recent illness, no electrolyte abnormalities to account for previously altered mental status.  Other causes to consider include creatinine slightly elevated at 1.14 in setting of CK of 753 after patient found down for almost 13 hours.  Patient evaluated by neurosurgery in ED, recommending repeat CT in 24 hours. - Neurosurgery to follow, will appreciate further recommendations - Repeat Head CT without contrast on 10/24 - Neuro checks - vitals q4 hours  - hold Plavix  - elevate head of bed   Shoulder Pain Initial xray of right shoulder showed irregularities concerning for fracture that were followed up with CT of right shoulder showing complete disruption of the supraspinatus and infraspinatus tendons with superior subluxation of the humeral head. Orthopedics curbsided in ED and recommended a sling and outpatient follow up. Patient denies pain during admission interview. Patient uses voltaren gel at home.  - Tylenol and kpad for pain  - maintain right upper extremity sling  - refer for outpatient orthopedics follow up   CHFrEF 25-30%, stable  Last echo in 2013 with EF estimate of 25-30%. Home medication includes Coreg 3.125 BID. XR chest consistent with cardiomegaly, patient has pacemaker in place. No LE edema.  - daily weights  - monitor fluid status   AAA, stable  Ultrasound in June of 2020 showed aortic measurement is 5.3 cm. The largest aortic diameter remains essentially unchanged compared to prior exam.  - monitor BP with vitals   HTN  BP initially hypertensive up to 174/146. Most recently 120/52. Home meds include Coreg 3.125 BID and ramipril.  - hold ramipril with small Cr elevation in setting of elevated CK  - monitor  BP with vitals q4 hours   Protein Calorie Malnutrition  Patient drinking home boosts and on mirtazapine 30mg  qHS.  - continue mirtazapine 30mg   - nutrition consult   Anemia, microcytic  Hgb 10.8 with MCV 74. Patient may have IDA or anemia of chronic disease.  - f/u iron panel  - consider starting iron supplementation with nutritional supplements given protein calorie malnutrition  - monitor Hgb   Chronic Constipation  Patient with history of constipation on home powder.  - miralax   Permanent Atrial Fibrillation  EKG on admission consistent with atrial fibrillation.  Patient has indwelling pacemaker. Patient on no anticoagulation currently. Home medication includes plavix 75mg  daily.  - hold plavix in setting of subdural hematoma  - cardiac monitoring   Protein Calorie Malnutrition   FEN/GI: heart healthy   Prophylaxis: SCDS  Disposition: progressive   History of Present Illness:  [name] [name] is a 86 y.o. male presenting after a fall and prolonged time down.   Patient presented to ED after sustaining fall while home alone last night around 2230.  Patient was unable to until close to 11:15 AM when his home health aide arrived at home.  Patient lives alone and is frequently visited by his daughter a few times per week.  Patient states.  He fell while trying to wash his hands in his bathroom before returning to bed.  He states that he normally uses a walker to ambulate around his home and after fall and was unable to get up independently.  Patient states that he was sent medications yesterday prior to his fall.  He reports his home health aide visits him 7 days/week for about 3 hours.  His home health aide helps with medications, bathing and dressing.  Patient states that he eats 2 meals per day that his aide helps to prepare for him.   Patient's daughter, Hassan Rowan, states that the patient has dementia but his baseline mental status is coherent and patient is able to easily converse  and answer questions appropriately.  She denies of the patient appear ill recently.  She does note that patient has had decreased appetite for a while now and has been attempting to get him to drink boost drinks.  Patient was also prescribed mirtazapine to help with weight gain.  She states that the patient initially appeared to have altered mental status but appears to be closer to baseline later into the evening while in the ED. she states that the patient had a another fall 6-8 weeks ago where she was present and able to help him.   Review Of Systems: Per HPI with the following additions:   Review of Systems  Constitutional: Positive for appetite change. Negative for fever.  HENT: Negative for rhinorrhea and sore throat.   Respiratory: Negative for cough and shortness of breath.   Cardiovascular: Negative for chest pain.  Gastrointestinal: Negative for abdominal pain and nausea.  Skin: Positive for wound.  Neurological: Negative for headaches.     Patient Active Problem List   Diagnosis Date Noted  . SDH (subdural hematoma) ([place]) 10/15/2020  . Dementia without behavioral disturbance ([place])   . Fall   . Elevated CK   . Impacted cerumen of left ear 07/28/2019  . AAA (abdominal aortic aneurysm) without rupture ([place]) 06/15/2015  .  Constipation 04/30/2014  . Knee problem 04/30/2014  . Back pain 03/21/2014  . Enlarged prostate 03/21/2014  . GERD (gastroesophageal reflux disease) 03/21/2014  . Protein-calorie malnutrition, severe ([place]) 12/08/2013  . Chest pain 12/07/2013  . Pacemaker-[employer] 08/26/2012  . Nonischemic/ ischemic cardiomyopathy   . Bradycardia   . Atrial fibrillation-permanent     Past Medical History: Past Medical History:  Diagnosis Date  . AAA (abdominal aortic aneurysm) without rupture ([place])    10/2012 CT scan: 4.3x4.6cm  . Arthritis    "legs are stiff" , had spinal injections for pain relieve 3-4 yrs. ago  . Atrial fibrillation-permanent   . Bradycardia   .  Cataract   . CHF (congestive heart failure) ([place])   . Constipation   . Depression   . Diverticulosis   . GERD (gastroesophageal reflux disease)   . Hypertension   . Nonischemic/ ischemic cardiomyopathy    Single-vessel coronary disease catheterization 2004-Myoview showing inferior wall perfusion defect Myoview 2006  . Recurrent upper respiratory infection (URI)    recent cold- treating /w OTC  . Subdural hematoma ([facility])    Prior evacuation    Past Surgical History: Past Surgical History:  Procedure Laterality Date  . BRAIN SURGERY     2002  . CATARACT EXTRACTION W/PHACO  01/30/2012   Procedure: CATARACT EXTRACTION PHACO AND INTRAOCULAR LENS PLACEMENT (IOC);  Surgeon: Adonis Brook, MD;  Location: [facility];  Service: Ophthalmology;  Laterality: Right;  . evacuation of subdural hematoma    . EYE SURGERY     cataract extracted in L eye  . GAS/FLUID EXCHANGE  07/30/2012   Procedure: GAS/FLUID EXCHANGE;  Surgeon: Adonis Brook, MD;  Location: [facility];  Service: Ophthalmology;  Laterality: Right;  . HERNIA REPAIR     R side inguinal repair   . PACEMAKER INSERTION  08/26/12   SJM Accent SR RF pacemaker  . PARS PLANA VITRECTOMY  07/30/2012   Procedure: PARS PLANA VITRECTOMY WITH 23 GAUGE;  Surgeon: Adonis Brook, MD;  Location: [facility];  Service: Ophthalmology;  Laterality: Right;  Insertion of Silicone Oil  . PERMANENT PACEMAKER INSERTION N/A 08/26/2012   Procedure: PERMANENT PACEMAKER INSERTION;  Surgeon: Thompson Grayer, MD;  Location: [facility] CATH LAB;  Service: Cardiovascular;  Laterality: N/A;  . TONSILLECTOMY     1957    Social History: Social History   Tobacco Use  . Smoking status: Former Smoker    Packs/day: 0.25    Years: 15.00    Pack years: 3.75    Quit date: 12/24/1994    Years since quitting: 25.8  . Smokeless tobacco: Never Used  Vaping Use  . Vaping Use: Never used  Substance Use Topics  . Alcohol use: No  . Drug use: No   Additional social history: lives alone, home health aid  visits daily 3-4 hours sessions  Please also refer to relevant sections of EMR.  Family History: Family History  Problem Relation Age of Onset  . Anesthesia problems Neg Hx   . Hypotension Neg Hx   . Malignant hyperthermia Neg Hx   . Pseudochol deficiency Neg Hx     Allergies and Medications: Allergies  Allergen Reactions  . Other Other (See Comments)    The patient has seasonal and/or environmental allergies- Runny nose   No current facility-administered medications on file prior to encounter.   Current Outpatient Medications on File Prior to Encounter  Medication Sig Dispense Refill  . carvedilol (COREG) 3.125 MG tablet Take 1 tablet (3.125 mg total) by mouth  2 (two) times daily with a meal. 60 tablet 3  . clopidogrel (PLAVIX) 75 MG tablet Take 37.5 mg by mouth daily.     . diclofenac sodium (VOLTAREN) 1 % GEL Apply 4 g topically 4 (four) times daily as needed (to affected/painful sites).     . gabapentin (NEURONTIN) 100 MG capsule Take 100 mg by mouth 2 (two) times daily.    Marland Kitchen GAVILAX 17 GM/SCOOP powder Take 17 g by mouth daily as needed for mild constipation (to be mixed into 8 ounces of water, soda, juice, coffee, or tea).     . mirtazapine (REMERON) 30 MG tablet Take 30 mg by mouth at bedtime.     . ramipril (ALTACE) 1.25 MG capsule Take 1 capsule (1.25 mg total) by mouth daily. 30 capsule 3  . docusate sodium (COLACE) 100 MG capsule Take 1 capsule (100 mg total) by mouth daily as needed for mild constipation. (Patient not taking: Reported on 10/15/2020) 10 capsule 0    Objective: BP (!) 120/52 (BP Location: Right Arm)   Pulse 75   Temp (!) 97.2 F (36.2 C) (Axillary)   Resp 18   Ht 6\' 1"  (1.854 m)   Wt 64 kg   SpO2 95%   BMI 18.62 kg/m   Exam: General: Frail, elderly male appearing stated age, thin extremities, cachectic Eyes: No scleral icterus, extraocular muscles intact bilaterally ENTM: Moist mucous membranes, dentures Cardiovascular: regularly  irregular Respiratory: CTAB without increased WOB, stable on RA with normal WOB  Gastrointestinal: soft abdomen, non-distended, non-tender to palpation  MSK: multiple abrasions on right lower and upper extremities, patella edema on right knee, normal ROM, rests with flexed knees and elbows demonstrates ability to extend bilateral extremities  Derm: dry skin with scaling, no ulcerations, abrasions on right shoulder and right knee  Neuro: alert and oriented to self,year, month, DOB able to tell both his and his daughter's addresses, rare tangential speech,  Psych: pleasantly responds appropriately to questions, normal rate of speech   Labs and Imaging: CBC BMET  Recent Labs  Lab 10/15/20 1448  WBC 6.3  HGB 10.8*  HCT 33.1*  PLT 222   Recent Labs  Lab 10/15/20 1448  NA 136  K 4.6  CL 107  CO2 19*  BUN 21  CREATININE 1.14  GLUCOSE 109*  CALCIUM 8.5*     EKG: atrial fibrillation, QTC 464, rate 65bpm   DG Chest 1 View  Result Date: 10/15/2020 CLINICAL DATA:  Fall EXAM: CHEST  1 VIEW COMPARISON:  01/28/2016 FINDINGS: Single lead left-sided implanted cardiac device. Stable cardiomegaly. Calcified thoracic aorta. No focal airspace consolidation, pleural effusion, or pneumothorax. Advanced degenerative changes of the bilateral shoulders. IMPRESSION: 1. No acute cardiopulmonary disease. 2. Stable cardiomegaly. Electronically Signed   By: Davina Poke D.O.   On: 10/15/2020 16:08   DG Shoulder Right  Result Date: 10/15/2020 CLINICAL DATA:  Fall, shoulder pain EXAM: RIGHT SHOULDER - 2+ VIEW COMPARISON:  09/12/2005 FINDINGS: Subtle cortical irregularity of the lateral cortex of the distal acromion. Possible subtle nondisplaced fracture of the greater tuberosity of the proximal humerus. No additional fracture. No dislocation. Advanced arthropathy of the glenohumeral and acromioclavicular joints. High-riding humeral head. There is soft tissue swelling over the lateral aspect of the  shoulder. IMPRESSION: 1. Subtle cortical irregularity of the lateral cortex of the distal acromion, suspicious for nondisplaced fracture. 2. Possible subtle nondisplaced fracture of the greater tuberosity of the proximal humerus. Electronically Signed   By: Davina Poke D.O.  On: 10/15/2020 16:11   CT Head Wo Contrast  Result Date: 10/15/2020 CLINICAL DATA:  Facial trauma.  Found on floor.  Fell last night. EXAM: CT HEAD WITHOUT CONTRAST CT MAXILLOFACIAL WITHOUT CONTRAST CT CERVICAL SPINE WITHOUT CONTRAST TECHNIQUE: Multidetector CT imaging of the head, cervical spine, and maxillofacial structures were performed using the standard protocol without intravenous contrast. Multiplanar CT image reconstructions of the cervical spine and maxillofacial structures were also generated. COMPARISON:  February 22, 2006 FINDINGS: CT HEAD FINDINGS Brain: There is an approximately 0.8 cm low density left subdural collection along the left cerebral convexity (see series 5, image 41). Mild associated mass effect with approximately 3 mm of rightward midline shift at the foramina [place]. Basal cisterns are patent. On the right, there is a trace (2 mm thick) hyperdensity, along the prior right cranioplasty (see series 5, image 64), which is favored to reflect chronic dural thickening given similar findings on remote CT head. No intraparenchymal hemorrhage. No evidence of acute large vascular territory infarct. Moderate generalized cerebral atrophy with ex vacuo ventricular dilation. Left dural calcifications. No mass lesion. No hydrocephalus. Vascular: Calcific atherosclerosis. Skull: No acute fracture. Postsurgical changes of right parietal craniectomy with cranioplasty. Other: None. CT MAXILLOFACIAL FINDINGS Osseous: No fracture or mandibular dislocation. No destructive process. Orbits: Negative. No traumatic or inflammatory finding. Sinuses: Mild scattered mucosal thickening.  No air-fluid levels. Soft tissues: Negative. CT  CERVICAL SPINE FINDINGS Alignment: Reversal of the normal cervical lordosis. Otherwise, no substantial subluxation. Skull base and vertebrae: No acute fracture. No primary bone lesion or focal pathologic process. Soft tissues and spinal canal: No prevertebral fluid or swelling. No visible canal hematoma. Disc levels: There is bulky bridging anterior osteophytes throughout the cervical spine.Multilevel degenerative change, including large left paracentral osteophyte at C5-C6 which results in narrowing of the left root entry zone. Upper chest: Emphysema without acute abnormality. Other: None. IMPRESSION: CT head: 1. Approximate 0.7 cm low-density subdural collection along the left cerebral convexity. Mild associated mass effect with approximately 3w mm of rightward midline shift 2. On the right, there is a trace (2 mm thick) hyperdensity, along the prior right cranioplasty, which is favored to reflect chronic dural thickening given similar findings on remote CT head. CT maxillofacial: No evidence of acute facial fracture. CT cervical spine: 1. No evidence of acute fracture or traumatic malalignment. 2. Bulky bridging anterior osteophytes throughout the cervical spine which exert mass effect on the adjacent esophagus. 3. 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 Recommend non emergent CT a to further evaluate if not previously imaged. 4. Multilevel degenerative change, including large left paracentral osteophyte at C5-C6 which results in narrowing of the left root entry zone. MRI could better characterize if clinically indicated. These findings discussed with Dr. Gustavus Messing at 3:40 p.m. via telephone. Electronically Signed   By: Margaretha Sheffield MD   On: 10/15/2020 15:50   CT Cervical Spine Wo Contrast  Result Date: 10/15/2020 CLINICAL DATA:  Facial  trauma.  Found on floor.  Fell last night. EXAM: CT HEAD WITHOUT CONTRAST CT MAXILLOFACIAL WITHOUT CONTRAST CT CERVICAL SPINE WITHOUT CONTRAST TECHNIQUE: Multidetector CT imaging of the head, cervical spine, and maxillofacial structures were performed using the standard protocol without intravenous contrast. Multiplanar CT image reconstructions of the cervical spine and maxillofacial structures were also generated. COMPARISON:  February 22, 2006 FINDINGS: CT HEAD  FINDINGS Brain: There is an approximately 0.8 cm low density left subdural collection along the left cerebral convexity (see series 5, image 41). Mild associated mass effect with approximately 3 mm of rightward midline shift at the foramina [employer]. Basal cisterns are patent. On the right, there is a trace (2 mm thick) hyperdensity, along the prior right cranioplasty (see series 5, image 64), which is favored to reflect chronic dural thickening given similar findings on remote CT head. No intraparenchymal hemorrhage. No evidence of acute large vascular territory infarct. Moderate generalized cerebral atrophy with ex vacuo ventricular dilation. Left dural calcifications. No mass lesion. No hydrocephalus. Vascular: Calcific atherosclerosis. Skull: No acute fracture. Postsurgical changes of right parietal craniectomy with cranioplasty. Other: None. CT MAXILLOFACIAL FINDINGS Osseous: No fracture or mandibular dislocation. No destructive process. Orbits: Negative. No traumatic or inflammatory finding. Sinuses: Mild scattered mucosal thickening.  No air-fluid levels. Soft tissues: Negative. CT CERVICAL SPINE FINDINGS Alignment: Reversal of the normal cervical lordosis. Otherwise, no substantial subluxation. Skull base and vertebrae: No acute fracture. No primary bone lesion or focal pathologic process. Soft tissues and spinal canal: No prevertebral fluid or swelling. No visible canal hematoma. Disc levels: There is bulky bridging anterior osteophytes throughout the  cervical spine.Multilevel degenerative change, including large left paracentral osteophyte at C5-C6 which results in narrowing of the left root entry zone. Upper chest: Emphysema without acute abnormality. Other: None. IMPRESSION: CT head: 1. Approximate 0.7 cm low-density subdural collection along the left cerebral convexity. Mild associated mass effect with approximately 3w mm of rightward midline shift 2. On the right, there is a trace (2 mm thick) hyperdensity, along the prior right cranioplasty, which is favored to reflect chronic dural thickening given similar findings on remote CT head. CT maxillofacial: No evidence of acute facial fracture. CT cervical spine: 1. No evidence of acute fracture or traumatic malalignment. 2. Bulky bridging anterior osteophytes throughout the cervical spine which exert mass effect on the adjacent esophagus. 3. 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 Recommend non emergent CT a to further evaluate if not previously imaged. 4. Multilevel degenerative change, including large left paracentral osteophyte at C5-C6 which results in narrowing of the left root entry zone. MRI could better characterize if clinically indicated. These findings discussed with Dr. Gustavus Messing at 3:40 p.m. via telephone. Electronically Signed   By: Margaretha Sheffield MD   On: 10/15/2020 15:50   CT Shoulder Right Wo Contrast  Result Date: 10/15/2020 CLINICAL DATA:  Fall shoulder pain possible fracture on radiograph EXAM: CT OF THE UPPER RIGHT EXTREMITY WITHOUT CONTRAST TECHNIQUE: Multidetector CT imaging of the upper right extremity was performed according to the standard protocol. COMPARISON:  Radiograph same day FINDINGS: Bones/Joint/Cartilage The study is somewhat limited due to patient motion. No definite displaced fracture is seen. The  cortical irregularity at the lateral corner of the humeral head and greater tuberosity are likely small enthesophytes. There is also a fragmented osteophyte at the distal acromion. There is superior subluxation of the humeral head. Moderate to advanced glenohumeral joint and AC joint arthrosis is noted. Ligaments Suboptimally assessed by CT. Muscles and Tendons No focal tear of the muscles is seen. There is edema within the posterior deltoid musculature. There is a probable complete chronic disruption of the supraspinatus and infraspinatus tendons with fatty atrophy of the muscle bellies. Soft tissues Soft tissue swelling  seen along the posterior and lateral aspect of the shoulder. IMPRESSION: 1. The study is limited due to patient motion. No definite displaced fracture is seen. If pain persists would recommend MRI for further evaluation. 2. The cortical irregularity at the greater tuberosity and distal acromion are likely fragmented enthesophyte/osteophytes. 3. Probable chronic complete disruption of the supraspinatus and infraspinatus tendons with superior subluxation of the humeral head. 4. Edema within the posterior deltoid musculature with overlying soft tissue edema. Electronically Signed   By: Prudencio Pair M.D.   On: 10/15/2020 20:07   DG Knee Complete 4 Views Left  Result Date: 10/15/2020 CLINICAL DATA:  Fall, left knee pain EXAM: LEFT KNEE - COMPLETE 4+ VIEW COMPARISON:  None. FINDINGS: No evidence of fracture, dislocation, or joint effusion. Mild-to-moderate tricompartmental osteoarthritis. Soft tissues are unremarkable. Vascular calcifications are present. IMPRESSION: 1. No acute osseous abnormality, left knee. 2. Mild to moderate tricompartmental osteoarthritis. Electronically Signed   By: Davina Poke D.O.   On: 10/15/2020 16:15   DG Knee Complete 4 Views Right  Result Date: 10/15/2020 CLINICAL DATA:  Fall, right knee pain EXAM: RIGHT KNEE - COMPLETE 4+ VIEW COMPARISON:  None. FINDINGS: No  evidence of fracture or dislocation. Small joint effusion. Moderate tricompartmental degenerative changes. Small loose bodies at the posterior joint line. Mild diffuse soft tissue swelling. Vascular calcifications are present. IMPRESSION: 1. No acute fracture or dislocation. 2. Small knee joint effusion. 3. Mild diffuse soft tissue swelling. Electronically Signed   By: Davina Poke D.O.   On: 10/15/2020 16:14   DG HIP UNILAT WITH PELVIS 2-3 VIEWS LEFT  Result Date: 10/15/2020 CLINICAL DATA:  Left hip pain after fall EXAM: DG HIP (WITH OR WITHOUT PELVIS) 2-3V LEFT COMPARISON:  None. FINDINGS: There is no evidence of hip fracture or dislocation. Mild-to-moderate degenerative changes of the left hip. A radiopaque structure projects over the left hip, likely representing a pen. IMPRESSION: No acute fracture or dislocation. Mild to moderate degenerative changes of the left hip. Electronically Signed   By: Davina Poke D.O.   On: 10/15/2020 16:17   DG HIP UNILAT WITH PELVIS 2-3 VIEWS RIGHT  Result Date: 10/15/2020 CLINICAL DATA:  Fall. EXAM: DG HIP (WITH OR WITHOUT PELVIS) 2-3V RIGHT COMPARISON:  None. FINDINGS: No fracture dislocation of the RIGHT hip. Osteophytosis about the hip joint. IMPRESSION: No evidence of RIGHT hip fracture.  Osteoarthritis noted. Electronically Signed   By: Suzy Bouchard M.D.   On: 10/15/2020 16:10   CT Maxillofacial WO CM  Result Date: 10/15/2020 CLINICAL DATA:  Facial trauma.  Found on floor.  Fell last night. EXAM: CT HEAD WITHOUT CONTRAST CT MAXILLOFACIAL WITHOUT CONTRAST CT CERVICAL SPINE WITHOUT CONTRAST TECHNIQUE: Multidetector CT imaging of the head, cervical spine, and maxillofacial structures were performed using the standard protocol without intravenous contrast. Multiplanar CT image reconstructions of the cervical spine and maxillofacial structures were also generated. COMPARISON:  February 22, 2006 FINDINGS: CT HEAD FINDINGS Brain: There is an approximately 0.8  cm low density left subdural collection along the left cerebral convexity (see series 5, image 41). Mild associated mass effect with approximately 3 mm of rightward midline shift at the foramina [place]. Basal cisterns are patent. On the right, there is a trace (2 mm thick) hyperdensity, along the prior right cranioplasty (see series 5, image 64), which is favored to reflect chronic dural thickening given similar findings on remote CT head. No intraparenchymal hemorrhage. No evidence of acute large vascular territory infarct. Moderate generalized cerebral atrophy with ex  vacuo ventricular dilation. Left dural calcifications. No mass lesion. No hydrocephalus. Vascular: Calcific atherosclerosis. Skull: No acute fracture. Postsurgical changes of right parietal craniectomy with cranioplasty. Other: None. CT MAXILLOFACIAL FINDINGS Osseous: No fracture or mandibular dislocation. No destructive process. Orbits: Negative. No traumatic or inflammatory finding. Sinuses: Mild scattered mucosal thickening.  No air-fluid levels. Soft tissues: Negative. CT CERVICAL SPINE FINDINGS Alignment: Reversal of the normal cervical lordosis. Otherwise, no substantial subluxation. Skull base and vertebrae: No acute fracture. No primary bone lesion or focal pathologic process. Soft tissues and spinal canal: No prevertebral fluid or swelling. No visible canal hematoma. Disc levels: There is bulky bridging anterior osteophytes throughout the cervical spine.Multilevel degenerative change, including large left paracentral osteophyte at C5-C6 which results in narrowing of the left root entry zone. Upper chest: Emphysema without acute abnormality. Other: None. IMPRESSION: CT head: 1. Approximate 0.7 cm low-density subdural collection along the left cerebral convexity. Mild associated mass effect with approximately 3w mm of rightward midline shift 2. On the right, there is a trace (2 mm thick) hyperdensity, along the prior right cranioplasty, which  is favored to reflect chronic dural thickening given similar findings on remote CT head. CT maxillofacial: No evidence of acute facial fracture. CT cervical spine: 1. No evidence of acute fracture or traumatic malalignment. 2. Bulky bridging anterior osteophytes throughout the cervical spine which exert mass effect on the adjacent esophagus. 3. 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 Recommend non emergent CT a to further evaluate if not previously imaged. 4. Multilevel degenerative change, including large left paracentral osteophyte at C5-C6 which results in narrowing of the left root entry zone. MRI could better characterize if clinically indicated. These findings discussed with Dr. Gustavus Messing at 3:40 p.m. via telephone. Electronically Signed   By: Margaretha Sheffield MD   On: 10/15/2020 15:50     Eulis Foster, MD 10/16/2020, 12:05 AM PGY-2, [place] Intern pager: [phone], text pages welcome

## 2020-10-16 ENCOUNTER — Inpatient Hospital Stay (HOSPITAL_COMMUNITY): Payer: Medicare HMO

## 2020-10-16 ENCOUNTER — Encounter (HOSPITAL_COMMUNITY): Payer: Self-pay | Admitting: Family Medicine

## 2020-10-16 DIAGNOSIS — Z602 Problems related to living alone: Secondary | ICD-10-CM

## 2020-10-16 DIAGNOSIS — W19XXXA Unspecified fall, initial encounter: Secondary | ICD-10-CM | POA: Diagnosis not present

## 2020-10-16 DIAGNOSIS — M75101 Unspecified rotator cuff tear or rupture of right shoulder, not specified as traumatic: Secondary | ICD-10-CM

## 2020-10-16 DIAGNOSIS — S065X9A Traumatic subdural hemorrhage with loss of consciousness of unspecified duration, initial encounter: Principal | ICD-10-CM

## 2020-10-16 DIAGNOSIS — F0280 Dementia in other diseases classified elsewhere without behavioral disturbance: Secondary | ICD-10-CM

## 2020-10-16 DIAGNOSIS — I10 Essential (primary) hypertension: Secondary | ICD-10-CM

## 2020-10-16 DIAGNOSIS — S43013A Anterior subluxation of unspecified humerus, initial encounter: Secondary | ICD-10-CM | POA: Diagnosis present

## 2020-10-16 DIAGNOSIS — M12811 Other specific arthropathies, not elsewhere classified, right shoulder: Secondary | ICD-10-CM

## 2020-10-16 DIAGNOSIS — I714 Abdominal aortic aneurysm, without rupture: Secondary | ICD-10-CM

## 2020-10-16 DIAGNOSIS — E43 Unspecified severe protein-calorie malnutrition: Secondary | ICD-10-CM

## 2020-10-16 DIAGNOSIS — R748 Abnormal levels of other serum enzymes: Secondary | ICD-10-CM

## 2020-10-16 DIAGNOSIS — I4821 Permanent atrial fibrillation: Secondary | ICD-10-CM

## 2020-10-16 DIAGNOSIS — S43011A Anterior subluxation of right humerus, initial encounter: Secondary | ICD-10-CM

## 2020-10-16 DIAGNOSIS — R2689 Other abnormalities of gait and mobility: Secondary | ICD-10-CM | POA: Diagnosis present

## 2020-10-16 DIAGNOSIS — N183 Chronic kidney disease, stage 3 unspecified: Secondary | ICD-10-CM

## 2020-10-16 DIAGNOSIS — G301 Alzheimer's disease with late onset: Secondary | ICD-10-CM

## 2020-10-16 DIAGNOSIS — Z950 Presence of cardiac pacemaker: Secondary | ICD-10-CM

## 2020-10-16 DIAGNOSIS — T148XXA Other injury of unspecified body region, initial encounter: Secondary | ICD-10-CM

## 2020-10-16 DIAGNOSIS — N1831 Chronic kidney disease, stage 3a: Secondary | ICD-10-CM

## 2020-10-16 HISTORY — DX: Chronic kidney disease, stage 3 unspecified: N18.30

## 2020-10-16 LAB — BASIC METABOLIC PANEL WITH GFR
Anion gap: 8 (ref 5–15)
BUN: 20 mg/dL (ref 8–23)
CO2: 23 mmol/L (ref 22–32)
Calcium: 8.8 mg/dL — ABNORMAL LOW (ref 8.9–10.3)
Chloride: 107 mmol/L (ref 98–111)
Creatinine, Ser: 1.24 mg/dL (ref 0.61–1.24)
GFR, Estimated: 54 mL/min — ABNORMAL LOW
Glucose, Bld: 109 mg/dL — ABNORMAL HIGH (ref 70–99)
Potassium: 4.7 mmol/L (ref 3.5–5.1)
Sodium: 138 mmol/L (ref 135–145)

## 2020-10-16 LAB — IRON AND TIBC
Iron: 22 ug/dL — ABNORMAL LOW (ref 45–182)
Saturation Ratios: 12 % — ABNORMAL LOW (ref 17.9–39.5)
TIBC: 186 ug/dL — ABNORMAL LOW (ref 250–450)
UIBC: 164 ug/dL

## 2020-10-16 LAB — FERRITIN: Ferritin: 318 ng/mL (ref 24–336)

## 2020-10-16 LAB — CK: Total CK: 681 U/L — ABNORMAL HIGH (ref 49–397)

## 2020-10-16 MED ORDER — CETAPHIL MOISTURIZING EX LOTN
TOPICAL_LOTION | Freq: Every day | CUTANEOUS | Status: DC
  Filled 2020-10-16: qty 473

## 2020-10-16 MED ORDER — ENSURE ENLIVE PO LIQD
237.0000 mL | Freq: Two times a day (BID) | ORAL | Status: DC
  Administered 2020-10-16 – 2020-10-17 (×4): 237 mL via ORAL

## 2020-10-16 NOTE — Evaluation (Addendum)
Physical Therapy Evaluation Patient Details Name: [name] [name] MRN: [id number] DOB: 01/05/1923 Today's Date: 10/16/2020   History of Present Illness  86 y.o. male presenting with elevated CK and subdural hematoma after being found down after fall while home alone. PMH is significant for permanent atrial fibrillation, indwelling pacemaker, protein calorie malnutrition, hypertension, CHFrEF 25-30%, dementia.  Clinical Impression  Pt presents to PT with deficits in functional mobility, gait, balance, endurance, strength, power, cognition. Pt with baseline cognitive deficits 2/2 dementia. Pt is generally weak at this time and unable to use RUE due to shoulder injury, remaining in sling until ortho consult per MD. Pt with posterior lean during all standing activity and requiring significant physical assistance to prevent fall with minimal ambulation. Pt will benefit from continued acute PT POC to reduce falls risk and caregiver burden. PT recommends SNF placement at this time.  *PT orders set to begin 10/17/2020, family medicine resident called and requests PT see patient on 10/16/2020 to initiate evaluation and mobility*    Follow Up Recommendations SNF    Equipment Recommendations  Wheelchair (measurements PT);Wheelchair cushion (measurements PT);Hospital bed (if home today)    Recommendations for Other Services       Precautions / Restrictions Precautions Precautions: Fall Precaution Comments: R shoulder sling Required Braces or Orthoses: Sling Restrictions Weight Bearing Restrictions: Yes RUE Weight Bearing: Non weight bearing Other Position/Activity Restrictions: assumed NWB with sling      Mobility  Bed Mobility Overal bed mobility: Needs Assistance Bed Mobility: Supine to Sit;Sit to Supine     Supine to sit: Mod assist Sit to supine: Min assist        Transfers Overall transfer level: Needs assistance Equipment used: 1 person hand held assist Transfers: Sit to/from  Stand Sit to Stand: Mod assist         General transfer comment: PT hand hold and assistance to power up into standing  Ambulation/Gait Ambulation/Gait assistance: Mod assist Gait Distance (Feet): 2 Feet (2' forward and backward) Assistive device: 1 person hand held assist Gait Pattern/deviations: Shuffle Gait velocity: reduced Gait velocity interpretation: <1.31 ft/sec, indicative of household ambulator General Gait Details: pt with short shuffling steps, posterior lean with PT assist to prevent posterior fall  Stairs            Wheelchair Mobility    Modified Rankin (Stroke Patients Only)       Balance Overall balance assessment: Needs assistance Sitting-balance support: Single extremity supported;Feet supported Sitting balance-Leahy Scale: Poor Sitting balance - Comments: reliant on UE support of bed   Standing balance support: Single extremity supported Standing balance-Leahy Scale: Poor Standing balance comment: modA due to posterior lean                             Pertinent Vitals/Pain Pain Assessment: Faces Faces Pain Scale: Hurts even more Pain Location: R shoulder and knee Pain Descriptors / Indicators: Aching Pain Intervention(s): Monitored during session    Home Living Family/patient expects to be discharged to:: Private residence Living Arrangements: Alone Available Help at Discharge: Personal care attendant;Family;Available PRN/intermittently Type of Home: House Home Access: Level entry     Home Layout: One level Home Equipment: Walker - 2 wheels;Cane - single point;Tub bench;Bedside commode      Prior Function Level of Independence: Needs assistance   Gait / Transfers Assistance Needed: pt reports he ambulates household distances with use of RW independently  ADL's / Homemaking Assistance Needed: personal care  attendant assists with bathing, cooking, cleaning, laundry        Hand Dominance   Dominant Hand: Right     Extremity/Trunk Assessment   Upper Extremity Assessment Upper Extremity Assessment: RUE deficits/detail;LUE deficits/detail RUE Deficits / Details: in sling, elbow ROM WFL, deferred shoulder assessment and strength assessment LUE Deficits / Details: LUE generalized weakness, grossly 4/5    Lower Extremity Assessment Lower Extremity Assessment: Generalized weakness (R weakness>L. R knee abrasions noted)    Cervical / Trunk Assessment Cervical / Trunk Assessment: Kyphotic  Communication   Communication: No difficulties  Cognition Arousal/Alertness: Awake/alert Behavior During Therapy: WFL for tasks assessed/performed Overall Cognitive Status: History of cognitive impairments - at baseline                                 General Comments: dementia at baseline, impaired short term and long term memory. 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 Pt disoriented to place      General Comments General comments (skin integrity, edema, etc.): VSS on RA    Exercises     Assessment/Plan    PT Assessment Patient needs continued PT services  PT Problem List Decreased strength;Decreased activity tolerance;Decreased balance;Decreased mobility;Decreased knowledge of use of DME;Decreased safety awareness;Decreased knowledge of precautions;Pain       PT Treatment Interventions DME instruction;Gait training;Functional mobility training;Therapeutic activities;Therapeutic exercise;Balance training;Neuromuscular re-education;Cognitive remediation;Patient/family education    PT Goals (Current goals can be found in the Care Plan section)  Acute Rehab PT Goals Patient Stated Goal: To go to rehab to get stronger PT Goal Formulation: With patient Time For Goal Achievement: 10/30/20 Potential to Achieve Goals: Fair    Frequency Min 2X/week   Barriers to discharge Decreased caregiver support      Co-evaluation                AM-PAC PT "6 Clicks" Mobility  Outcome Measure Help needed turning from your back to your side while in a flat bed without using bedrails?: A Little Help needed moving from lying on your back to sitting on the side of a flat bed without using bedrails?: A Lot Help needed moving to and from a bed to a chair (including a wheelchair)?: A Lot Help needed standing up from a chair using your arms (e.g., wheelchair or bedside chair)?: A Lot Help needed to walk in hospital room?: A Lot Help needed climbing 3-5 steps with a railing? : Total 6 Click Score: 12    End of Session   Activity Tolerance: Patient tolerated treatment well Patient left: in bed;with call bell/phone within reach;with bed alarm set Nurse Communication: Mobility status;Need for lift equipment (STEDY for transfers) PT Visit Diagnosis: Unsteadiness on feet (R26.81);Other abnormalities of gait and mobility (R26.89);Muscle weakness (generalized) (M62.81)    Time: 6256-3893 PT Time Calculation (min) (ACUTE ONLY): 20 min   Charges:   PT Evaluation $PT Eval Moderate Complexity: 1 Mod          Zenaida Niece, PT, DPT Acute Rehabilitation Pager: [phone]   Zenaida Niece 10/16/2020, 2:55 PM

## 2020-10-16 NOTE — Progress Notes (Signed)
Family Medicine Teaching Service Daily Progress Note Intern Pager: [phone]  Patient name: [name] [name] Medical record number: [id number] Date of birth: July 20, 1923 Age: 86 y.o. Gender: male  Primary Care Provider: Sonia Side., FNP Consultants: Neurosurgery  Code Status: Full Code   Pt Overview and Major Events to Date:  10/23: admitted after fall and rhabdo, subdural hematoma   Assessment and Plan:  [name] [name] is a 86 y.o. male presenting with elevated CK and subdural hematoma after being found down after fall while home alone. PMH is significant for permanent atrial fibrillation, indwelling pacemaker, protein calorie malnutrition, hypertension, CHFrEF 25-30%, dementia.   Rhabdomyolysis after fall and prolonged time down  AKI  Patient continued on IV fluids overnight at 75 mL/h, normal saline.  Creatinine this morning slightly increased to 1.24 from 1.14. Review of Cr from 4 years ago ranged was around 1.4. CK this morning decreased from 753-681. - continue IV fluids NS  - Continue to monitor Cr and CK daily  - PT/OT   Subdural Hematoma, acute   AMS, resolved  Hx of Dementia  Patient denies any HA. Remains neurologically intact and answers orientation questions appropriately, easy to wake. PERRLA.  - Neurosurgery to follow, will appreciate further recommendations - Follow-up repeat Head CT later today - Neuro checks - vitals q4 hours  - Maintain elevated head of bed   Shoulder Pain, resolved Patient reports no shoulder pain this morning.  - Tylenol and kpad for pain  - maintain right upper extremity sling  - refer for outpatient orthopedics follow up   CHFrEF 25-30%, stable  Last echo in 2013 with EF estimate of 25-30%. Home medication includes Coreg 3.125 BID. XR chest consistent with cardiomegaly, patient has pacemaker in place. No LE edema on exam, patient denies presence of  respiratory symptoms this morning.  - daily weights  - monitor fluid status   AAA,  stable  Ultrasound in June of 2020 showed aortic measurement is 5.3 cm noting that the largest aortic diameter remains essentially unchanged compared to prior exam.  - monitor BP with vitals   HTN  BP initially hypertensive up to 174/146 on admission, most recently blood pressure 104/60 prior to that 120/52. Home meds include Coreg 3.125 BID and ramipril.   - hold ramipril with small Cr elevation in setting of elevated CK  - monitor BP with vitals q4 hours   Protein Calorie Malnutrition  Patient drinking home boosts and on mirtazapine 30mg  qHS.  - continue mirtazapine 30mg   - Follow-up recommendations from nutrition consult   Anemia, microcytic  Patient admitted with hemoglobin of 10.8 and MCV low at 74.  Iron low at 22, TIBC low at 186 and saturation ratio low at 12.  U IBC normal at 164.  Ferritin within normal range at 318. Values consistent with anemia of chronic disease. Malnutrition may also be contributing. No signs of blood loss including hematuria or melena.  -  CBC tomorrow morning to check Hgb   Chronic Constipation  Patient with history of constipation on home powder.  - miralax   Permanent Atrial Fibrillation  EKG on admission consistent with atrial fibrillation.  Patient has indwelling pacemaker. Patient on no anticoagulation currently. Home medication includes plavix 75mg  daily.  - hold plavix in setting of subdural hematoma  - cardiac monitoring   FEN/GI: heart healthy   Prophylaxis: SCDS, active bleed   Disposition: progressive   Subjective:  Patient reports doing well overall this morning, with no new complaints. Denies HA,  vision changes, confusion, or pain in his shoulder or leg.   Objective: Temp:  [97.2 F (36.2 C)-98.4 F (36.9 C)] 98.4 F (36.9 C) (10/24 0300) Pulse Rate:  [55-75] 64 (10/24 0300) Resp:  [11-21] 15 (10/24 0300) BP: (97-174)/(52-146) 104/60 (10/24 0300) SpO2:  [59 %-100 %] 100 % (10/24 0300) Weight:  [64 kg] 64 kg (10/23  1349)  Physical Exam: General: elderly male, frail appearing lying in bed in NAD  Cardiovascular: regular rate and rhythm, do not appreciate murmur  Respiratory: CTAB without increased WOB, no wheezing no crackles, remains stable on RA   Abdomen: abdomen is soft, no tenderness or distention  Extremities: thin, no edema, healing abrasion, right knee swelling stable  Neuro: answers orientation questions appropriately, easy to wake. PERRLA. Equal strength in bilateral lower and upper extremities   Laboratory: Recent Labs  Lab 10/15/20 1448  WBC 6.3  HGB 10.8*  HCT 33.1*  PLT 222   Recent Labs  Lab 10/15/20 1448 10/16/20 0349  NA 136 138  K 4.6 4.7  CL 107 107  CO2 19* 23  BUN 21 20  CREATININE 1.14 1.24  CALCIUM 8.5* 8.8*  PROT 6.5  --   BILITOT 1.3*  --   ALKPHOS 48  --   ALT 13  --   AST 28  --   GLUCOSE 109* 109*    Imaging/Diagnostic Tests: Pending results of repeat head CT   Eulis Foster, MD 10/16/2020, 6:34 AM PGY-2, [place] Intern pager: [phone], text pages welcome

## 2020-10-16 NOTE — Hospital Course (Addendum)
[name] [name] is a 86 y.o. male who presented after a fall and prolonged time down that resulted in a subdural hematoma and rhabdomyolysis. PMH is significant for permanent atrial fibrillation, indwelling pacemaker, protein calorie malnutrition, hypertension, CHFrEF 25-30%, dementia.  Rhabdomyolysis Patient was treated with IVF for *** days due to elevated CK of 753 on admission. His initial Cr was 1.14. Patient was evaluated by PT/OT and recommended for ***. Patient presented from home and lived independently at the time of the fall; using a walker for mobilization around his home. Prior to discharge, his CK had decreased to ***.    Subdural Hematoma  Patient sustained a head injury during his fall resulting in a left cerebral subdural hematoma. He was evaluated by neurosurgery in the ED who recommended repeat head CT in 24 hours and cessation of any AC or antiplatelet therapy. Patient's home Plavix was held. Repeat head CT showed ***. During this hospital stay, he was on cardiac monitors and placed in the progressive unit for frequent vitals and neuro checks. Patient remained neurologically stable throughout this admission and was discharged to ***.    Severe Protein Calorie Malnutrition Patient noted to have decreased appetite prior to admission with cachetic appearance and muscle wasting on exam. Nutrition was consulted and recommended increase in Ensure to increase caloric intake and meet protein needs.    F/u: History of AAA, stable but repeat aortic US demonstrated recommended follow up with CT/MR every 6 months. Also recommended vascular outpatient follow up. Refer to vascular outpatient.

## 2020-10-16 NOTE — Progress Notes (Signed)
Patient ID: [name] [name], male   DOB: 1923-12-01, 86 y.o.   MRN: [id number] Patient states he is doing "pretty good" and denies headache.  He seems to be moving all extremities.  He does complain of some right shoulder pain.  No neck pain.  I reviewed the CT scan of the head which is fairly unimpressive.  There may be a small hypodense fluid collection in the left subdural space without significant mass-effect or shift.  This obviously needs no neurosurgical intervention in a 86 year old gentleman.  CT scan was ordered for today which is not probably absolutely necessary, but please let us know if it shows any change.  Otherwise we will sign off.  Please call us for any further questions.

## 2020-10-17 ENCOUNTER — Inpatient Hospital Stay (HOSPITAL_COMMUNITY): Payer: Medicare HMO

## 2020-10-17 DIAGNOSIS — S065X9A Traumatic subdural hemorrhage with loss of consciousness of unspecified duration, initial encounter: Secondary | ICD-10-CM | POA: Diagnosis not present

## 2020-10-17 DIAGNOSIS — S43011D Anterior subluxation of right humerus, subsequent encounter: Secondary | ICD-10-CM | POA: Diagnosis not present

## 2020-10-17 DIAGNOSIS — S43011A Anterior subluxation of right humerus, initial encounter: Secondary | ICD-10-CM | POA: Diagnosis not present

## 2020-10-17 DIAGNOSIS — I714 Abdominal aortic aneurysm, without rupture: Secondary | ICD-10-CM | POA: Diagnosis not present

## 2020-10-17 LAB — CBC WITH DIFFERENTIAL/PLATELET
Abs Immature Granulocytes: 0.01 10*3/uL (ref 0.00–0.07)
Basophils Absolute: 0 10*3/uL (ref 0.0–0.1)
Basophils Relative: 0 %
Eosinophils Absolute: 0.1 10*3/uL (ref 0.0–0.5)
Eosinophils Relative: 2 %
HCT: 34.3 % — ABNORMAL LOW (ref 39.0–52.0)
Hemoglobin: 11.2 g/dL — ABNORMAL LOW (ref 13.0–17.0)
Immature Granulocytes: 0 %
Lymphocytes Relative: 25 %
Lymphs Abs: 1.2 10*3/uL (ref 0.7–4.0)
MCH: 24.2 pg — ABNORMAL LOW (ref 26.0–34.0)
MCHC: 32.7 g/dL (ref 30.0–36.0)
MCV: 74.2 fL — ABNORMAL LOW (ref 80.0–100.0)
Monocytes Absolute: 0.5 10*3/uL (ref 0.1–1.0)
Monocytes Relative: 10 %
Neutro Abs: 2.9 10*3/uL (ref 1.7–7.7)
Neutrophils Relative %: 63 %
Platelets: 231 10*3/uL (ref 150–400)
RBC: 4.62 MIL/uL (ref 4.22–5.81)
RDW: 17.4 % — ABNORMAL HIGH (ref 11.5–15.5)
WBC: 4.7 10*3/uL (ref 4.0–10.5)
nRBC: 0 % (ref 0.0–0.2)

## 2020-10-17 LAB — CK: Total CK: 350 U/L (ref 49–397)

## 2020-10-17 LAB — FERRITIN: Ferritin: 290 ng/mL (ref 24–336)

## 2020-10-17 LAB — BASIC METABOLIC PANEL
Anion gap: 6 (ref 5–15)
BUN: 18 mg/dL (ref 8–23)
CO2: 21 mmol/L — ABNORMAL LOW (ref 22–32)
Calcium: 8.5 mg/dL — ABNORMAL LOW (ref 8.9–10.3)
Chloride: 112 mmol/L — ABNORMAL HIGH (ref 98–111)
Creatinine, Ser: 1.21 mg/dL (ref 0.61–1.24)
GFR, Estimated: 55 mL/min — ABNORMAL LOW (ref >60)
Glucose, Bld: 75 mg/dL (ref 70–99)
Potassium: 4.5 mmol/L (ref 3.5–5.1)
Sodium: 139 mmol/L (ref 135–145)

## 2020-10-17 MED ORDER — ENSURE ENLIVE PO LIQD
237.0000 mL | Freq: Three times a day (TID) | ORAL | Status: DC
  Administered 2020-10-17 – 2020-10-28 (×30): 237 mL via ORAL

## 2020-10-17 NOTE — TOC CAGE-AID Note (Signed)
Transition of Care [facility]) - CAGE-AID Screening   Patient Details  Name: Lenis Nettleton MRN: [id number] Date of Birth: 26-Jun-1923  Clinical Narrative: Patient denies any alcohol use. No interventions needed at this time.   CAGE-AID Screening:   Have You Ever Felt You Ought to Cut Down on Your Drinking or Drug Use?: No Have People Annoyed You By Critizing Your Drinking Or Drug Use?: No Have You Felt Bad Or Guilty About Your Drinking Or Drug Use?: No Have You Ever Had a Drink or Used Drugs First Thing In The Morning to Steady Your Nerves or to Get Rid of a Hangover?: No CAGE-AID Score: 0

## 2020-10-17 NOTE — Evaluation (Signed)
Occupational Therapy Evaluation Patient Details Name: [name] [name] MRN: [id number] DOB: 02/11/1923 Today's Date: 10/17/2020    History of Present Illness 86 y.o. male presenting with elevated CK and subdural hematoma after being found down after fall while home alone. PMH is significant for permanent atrial fibrillation, indwelling pacemaker, protein calorie malnutrition, hypertension, CHFrEF 25-30%, dementia.   Clinical Impression   This 86 y/o male presents with the above. PTA pt reports living alone, using RW for household mobility; receiving intermittent assist from a [facility] aide for some ADL/iADL (aide comes 7 days/wk, 3hrs/day). Pt very pleasant and willing to participate in therapy session. Pt with hx of cognitive impairments, but is able to recall and verbalize events leading up to his hospitalization today. He requires up to maxA for functional transfers (via HHA), tolerating OOB to recliner, requiring maxA for LB ADL and sling management seated EOB. VSS throughout. Pt to benefit from continued acute OT services and currently recommend follow up therapy services in SNF setting to maximize his safety and independence with ADL and mobility.     Follow Up Recommendations  SNF;Supervision/Assistance - 24 hour    Equipment Recommendations  Other (comment);Wheelchair (measurements OT);Wheelchair cushion (measurements OT);3 in 1 bedside commode (TBD)           Precautions / Restrictions Precautions Precautions: Fall Precaution Comments: R shoulder sling Required Braces or Orthoses: Sling Restrictions Weight Bearing Restrictions: Yes RUE Weight Bearing: Non weight bearing Other Position/Activity Restrictions: assumed NWB with sling, per chart pt to follow up with outpt ortho      Mobility Bed Mobility Overal bed mobility: Needs Assistance Bed Mobility: Supine to Sit     Supine to sit: Mod assist     General bed mobility comments: pt able to move LEs towards EOB with assist for  trunk elevation and to scoot hips initially; +dizziness initially which improves with seated rest     Transfers Overall transfer level: Needs assistance Equipment used: 1 person hand held assist Transfers: Sit to/from [facility] Sit to Stand: Mod assist Stand pivot transfers: Max assist       General transfer comment: pt better able to come to standing when pushing through LUE to assist, asisst to stabilize once upright and cues for upright posture; pt requiring increased assist to step/turn towards recliner. performed additional stand from recliner to change bed pad (as primofit leaking initially)    Balance Overall balance assessment: Needs assistance Sitting-balance support: Single extremity supported;Feet supported;No upper extremity supported Sitting balance-Leahy Scale: Fair Sitting balance - Comments: close guarding for balance    Standing balance support: Single extremity supported Standing balance-Leahy Scale: Poor Standing balance comment: external assist                            ADL either performed or assessed with clinical judgement   ADL Overall ADL's : Needs assistance/impaired Eating/Feeding: Minimal assistance;Sitting   Grooming: Sitting;Minimal assistance   Upper Body Bathing: Moderate assistance;Sitting   Lower Body Bathing: Maximal assistance;Sit to/from stand   Upper Body Dressing : Moderate assistance;Sitting Upper Body Dressing Details (indicate cue type and reason): assist for readjusting sling  Lower Body Dressing: Maximal assistance;Sit to/from stand   Toilet Transfer: Maximal assistance;Stand-pivot Toilet Transfer Details (indicate cue type and reason): simulated via transfer to [place] and Hygiene: Maximal assistance;Sit to/from stand       Functional mobility during ADLs: Maximal assistance (stand pivot)  Pertinent Vitals/Pain Pain  Assessment: Faces Faces Pain Scale: Hurts little more Pain Location: R shoulder and knee Pain Descriptors / Indicators: Aching Pain Intervention(s): Monitored during session;Repositioned     Hand Dominance Right   Extremity/Trunk Assessment Upper Extremity Assessment Upper Extremity Assessment: Generalized weakness;RUE deficits/detail RUE Deficits / Details: in sling, elbow/wrist/hand appear WFL (Grossly weak)   Lower Extremity Assessment Lower Extremity Assessment: Defer to PT evaluation   Cervical / Trunk Assessment Cervical / Trunk Assessment: Kyphotic   Communication Communication Communication: No difficulties   Cognition Arousal/Alertness: Awake/alert Behavior During Therapy: WFL for tasks assessed/performed Overall Cognitive Status: History of cognitive impairments - at baseline                                 General Comments: dementia at baseline, pt oriented x3 (disoriented to time), was able to recall and tell OT events leading up to hospitalization; appears to be accurate historian for home setup/PLOF.    General Comments  VSS, pt with some dizziness initially when sitting EOB with BP stable    Exercises     Shoulder Instructions      Home Living Family/patient expects to be discharged to:: Private residence Living Arrangements: Alone Available Help at Discharge: Personal care attendant;Family;Available PRN/intermittently Type of Home: House Home Access: Level entry     Home Layout: One level     Bathroom Shower/Tub: Teacher, early years/pre: Standard     Home Equipment: Environmental consultant - 2 wheels;Cane - single point;Tub bench;Bedside commode          Prior Functioning/Environment Level of Independence: Needs assistance  Gait / Transfers Assistance Needed: pt reports he ambulates household distances with use of RW independently ADL's / Homemaking Assistance Needed: personal care attendant assists with bathing, cooking, cleaning,  laundry; 3hrs/day, 7 days/wk            OT Problem List: Decreased strength;Decreased range of motion;Decreased activity tolerance;Impaired balance (sitting and/or standing);Decreased cognition;Decreased safety awareness;Pain;Impaired UE functional use;Decreased knowledge of precautions;Decreased knowledge of use of DME or AE      OT Treatment/Interventions: Self-care/ADL training;Therapeutic exercise;DME and/or AE instruction;Therapeutic activities;Cognitive remediation/compensation;Patient/family education;Balance training    OT Goals(Current goals can be found in the care plan section) Acute Rehab OT Goals Patient Stated Goal: To go to rehab to get stronger OT Goal Formulation: With patient Time For Goal Achievement: 10/31/20 Potential to Achieve Goals: Good  OT Frequency: Min 2X/week   Barriers to D/C:            Co-evaluation              AM-PAC OT "6 Clicks" Daily Activity     Outcome Measure Help from another person eating meals?: A Little Help from another person taking care of personal grooming?: A Little Help from another person toileting, which includes using toliet, bedpan, or urinal?: A Lot Help from another person bathing (including washing, rinsing, drying)?: A Lot Help from another person to put on and taking off regular upper body clothing?: A Lot Help from another person to put on and taking off regular lower body clothing?: A Lot 6 Click Score: 14   End of Session Equipment Utilized During Treatment: Gait belt;Other (comment) (sling) Nurse Communication: Mobility status  Activity Tolerance: Patient tolerated treatment well Patient left: in chair;with call bell/phone within reach;with chair alarm set  OT Visit Diagnosis: Other abnormalities of gait and mobility (R26.89);History of falling (Z91.81);Muscle  weakness (generalized) (M62.81)                Time: 9574-7340 OT Time Calculation (min): 33 min Charges:  OT General Charges $OT Visit: 1  Visit OT Evaluation $OT Eval Moderate Complexity: 1 Mod OT Treatments $Self Care/Home Management : 8-22 mins  Lou Cal, OT Acute Rehabilitation Services Pager [phone] Office [phone]  Raymondo Band 10/17/2020, 2:21 PM

## 2020-10-17 NOTE — Progress Notes (Addendum)
Family Medicine Teaching Service Daily Progress Note Intern Pager: [phone]  Patient name: [name] [name] Medical record number: [id number] Date of birth: December 03, 1923 Age: 86 y.o. Gender: male  Primary Care Provider: Sonia Side., FNP Consultants: Neurosurgery  Code Status: Full  Pt Overview and Major Events to Date:  Admitted 10/23  Assessment and Plan: [name] [name] is a 86 y.o. male presenting with elevated CK and subdural hematoma after being found down after fall while home alone. PMH is significant for permanent atrial fibrillation, indwelling pacemaker, protein calorie malnutrition, hypertension, CHFrEF 25-30%, dementia.   Rhabdomyolysis after fall and prolonged time down  AKI   Most recent creatinine 1.24, likely resolved. Most recent CK 681, slightly improving from 753.  - continue IV fluids NS at 74 mL/hr - Continue to monitor Cr and CK daily, pending labs - PT/OT, recommended wheelchair. DME order placed. Also recommended SNF, TOC consult placed.  Subdural Hematoma, acute   AMS, resolved  Hx of Dementia  AOx3, denies any other generalized or localized pain. Palpation of left parietal regions elicits mild tenderness.  - Neurosurgery signed off, appreciate involvement  - F/u repeat CT head today - Neuro checks - vitals q4 hours  - Maintain elevated head of bed   Shoulder Pain Likely resolved, denies any current shoulder pain but states that he occasionally experiences pain if he moves his right arm.  - Tylenol and kpad for pain  - maintain right upper extremity sling  - refer for outpatient orthopedics follow up   CHFrEF 25-30%, stable  Last echo in 2013 with EF estimate of 25-30%. Home medication includes Coreg 3.125 BID. XR chest consistent with cardiomegaly, patient has pacemaker in place. Most recent weight 63.5 kg compared to 64 kg on admission.  - daily weights  - monitor fluid status   AAA, stable  Most recent aortic US demonstrated 5 cm distal  abdominal aneurysm noted, recommended follow up with CT/MRI every 6 months along with vascular consultation. Ultrasound in June of 2020 showed aortic measurement is 5.3 cm noting that the largest aortic diameter remains essentially unchanged compared to prior exam.  - monitor BP with vitals  -F/u every 6 months for CT/MRI -F/u vascular outpatient   HTN  BP initially hypertensive up to 174/146 on admission, most recently blood pressure 110/67. Home meds include Coreg 3.125 BID and ramipril.   - continue home coreg  - hold ramipril with small Cr elevation in setting of elevated CK  - monitor BP  Protein Calorie Malnutrition  Patient drinking home boosts and on mirtazapine 30mg  qHS.  - continue mirtazapine 30mg   - nutrition consult, appreciate recs and involvement   Anemia, microcytic  Patient admitted with hemoglobin of 10.8 and MCV low at 74.  Iron low at 22, TIBC low at 186 and saturation ratio low at 12.  U IBC normal at 164.  Ferritin within normal range at 318. Values consistent with anemia of chronic disease, possibly due to reactive causes. Malnutrition may also be contributing.  -monitor Hgb, pending CBC  Chronic Constipation  Patient with history of constipation on home powder.  - miralax   Permanent Atrial Fibrillation  HR 67 today. EKG on admission consistent with atrial fibrillation.  Patient has indwelling pacemaker. Patient on no anticoagulation currently. Home medication includes plavix 75mg  daily.  - hold plavix in setting of subdural hematoma  - cardiac monitoring    FEN/GI: heart healthy PPx: SCDs   Disposition: inpatient, progressive  Subjective:  No overnight events reported. Patient denies any new concerns at this time.   Objective: Temp:  [97.7 F (36.5 C)-98.7 F (37.1 C)] 98.2 F (36.8 C) (10/25 0310) Pulse Rate:  [61-68] 63 (10/25 0310) Resp:  [16-22] 21 (10/25 0310) BP: (98-139)/(56-85) 110/67 (10/25 0310) SpO2:  [99 %-100 %]  100 % (10/25 0310) Weight:  [63.5 kg] 63.5 kg (10/25 0500) Physical Exam: General: Patient sleeping comfortably with head of bed elevated, in no acute distress. Cardiovascular: RRR, no murmurs or gallops auscultated  Respiratory: lungs clear to auscultation bilaterally, breathing comfortably on room air  Abdomen: soft, nontender, presence of active bowel sounds Extremities: radial and dorsalis pedis pulses strong and equal bilaterally, SCDs in place  Neuro: AOx3, appropriately conversational Psych: mood appropriate   Laboratory: Recent Labs  Lab 10/15/20 1448  WBC 6.3  HGB 10.8*  HCT 33.1*  PLT 222   Recent Labs  Lab 10/15/20 1448 10/16/20 0349  NA 136 138  K 4.6 4.7  CL 107 107  CO2 19* 23  BUN 21 20  CREATININE 1.14 1.24  CALCIUM 8.5* 8.8*  PROT 6.5  --   BILITOT 1.3*  --   ALKPHOS 48  --   ALT 13  --   AST 28  --   GLUCOSE 109* 109*      Imaging/Diagnostic Tests: US AORTA  Result Date: 10/16/2020 CLINICAL DATA:  Abdominal aortic aneurysm. EXAM: ULTRASOUND OF ABDOMINAL AORTA TECHNIQUE: Ultrasound examination of the abdominal aorta and proximal common iliac arteries was performed to evaluate for aneurysm. Additional color and Doppler images of the distal aorta were obtained to document patency. COMPARISON:  June 17, 2015. FINDINGS: Abdominal aortic measurements as follows: Proximal:  2.8 cm Mid:  3.1 cm Distal:  5.0 cm Patent: Yes, peak systolic velocity is 41 cm/s Right common iliac artery: 1.8 cm Left common iliac artery: 1.9 cm IMPRESSION: 5 cm distal abdominal aortic aneurysm is noted. Recommend follow-up CT/MR every 6 months and vascular consultation. This recommendation follows ACR consensus guidelines: White Paper of the ACR Incidental Findings Committee II on Vascular Findings. J Am Coll Radiol 2013; 10:789-794. Electronically Signed   By: Marijo Conception M.D.   On: 10/16/2020 13:39    Donney Dice, DO 10/17/2020, 6:33 AM PGY-1, [place] Intern pager: [phone], text pages welcome

## 2020-10-17 NOTE — TOC Initial Note (Addendum)
Transition of Care [facility]) - Initial/Assessment Note    Patient Details  Name: [name] [name] MRN: [id number] Date of Birth: 10-16-1923  Transition of Care [facility]) CM/SW Contact:    Vinie Sill, [place] Phone Number: 10/17/2020, 4:28 PM  Clinical Narrative:                  CSW visit with the patient at bedside. Patient was resting and did not want to complete assessment at this time.  CSW called and spoke with her daughter,Brenda. CSW introduced self and explained role. She states the patient lives in the home alone. He has a caregiver 7days per week 12-3p. CSW informed of PT recommendation of short term rehab at [facility] before discharging home. She is agreeable to rehab but states the patient mat not be "willing to go". CSW was given permission to start the  SNF process as back up. CSW explained the SNF process. Patient has received the covid vaccines. No preferred SNF but would like somewhere close to the hospital.  CSW will continue to follow and assist with discharge planning. CSW will follow up with patient and his daughter tomorrow.  Thurmond Butts, MSW, LCSWA Clinical Social Worker '   Expected Discharge Plan: [place] Barriers to Discharge: Continued Medical Work up   Patient Goals and CMS Choice        Expected Discharge Plan and Services Expected Discharge Plan: [place] In-house Referral: Clinical Social Work     Living arrangements for the past 2 months: Single Family Home                                      Prior Living Arrangements/Services Living arrangements for the past 2 months: Single Family Home Lives with:: Self Patient language and need for interpreter reviewed:: No        Need for Family Participation in Patient Care: Yes (Comment) Care giver support system in place?: Yes (comment)   Criminal Activity/Legal Involvement Pertinent to Current Situation/Hospitalization: No - Comment as needed  Activities of  Daily Living      Permission Sought/Granted Permission sought to share information with : Family Supports    Share Information with NAME: [place] Newcomer     Permission granted to share info w Relationship: daughter  Permission granted to share info w Contact Information: [phone]  Emotional Assessment Appearance:: Appears stated age Attitude/Demeanor/Rapport: Unable to Assess (pateint did not want to participate in assessment at this time) Affect (typically observed): Unable to Assess Orientation: : Oriented to Self, Oriented to Place, Oriented to  Time Alcohol / Substance Use: Not Applicable    Admission diagnosis:  Subdural hematoma ([place]) [S06.5X9A] SDH (subdural hematoma) ([place]) [I43.3I9J] Fall [W19.XXXA] Multiple skin tears [T14.8XXA] Fall, initial encounter [id number]] Dementia without behavioral disturbance, unspecified dementia type ([place]) [F03.90] Patient Active Problem List   Diagnosis Date Noted  . Essential hypertension 10/16/2020  . Lives alone with help available 10/16/2020  . CKD (chronic kidney disease), stage III ([place]) 10/16/2020  . Balance problem 10/16/2020  . Rotator cuff tear arthropathy of right shoulder 10/16/2020  . Superior subluxation of humeral head, right 10/16/2020  . Multiple skin tears   . SDH (subdural hematoma) ([place]) 10/15/2020  . Dementia without behavioral disturbance ([place])   . Fall   . Elevated CK   . AAA (abdominal aortic aneurysm) without rupture ([place]) 06/15/2015  . Constipation 04/30/2014  . Enlarged  prostate 03/21/2014  . GERD (gastroesophageal reflux disease) 03/21/2014  . Protein-calorie malnutrition, severe ([place]) 12/08/2013  . Pacemaker-St.Jude 08/26/2012  . Nonischemic/ ischemic cardiomyopathy   . Atrial fibrillation-permanent    PCP:  Sonia Side., FNP Pharmacy:   [place], [place] - [address] [place] [address] Phone: [phone] Fax:  [phone]     Social Determinants of Health (SDOH) Interventions    Readmission Risk Interventions No flowsheet data found.

## 2020-10-17 NOTE — Progress Notes (Signed)
Initial Nutrition Assessment  DOCUMENTATION CODES:   Underweight  INTERVENTION:  Provide Ensure Enlive po TID, each supplement provides 350 kcal and 20 grams of protein  Encourage adequate PO intake.   NUTRITION DIAGNOSIS:   Increased nutrient needs related to chronic illness (CHF) as evidenced by estimated needs.  GOAL:   Patient will meet greater than or equal to 90% of their needs  MONITOR:   PO intake, Supplement acceptance, Skin, Weight trends, Labs, I & O's  REASON FOR ASSESSMENT:   Consult Assessment of nutrition requirement/status  ASSESSMENT:   86 y.o. male presenting with elevated CK and subdural hematoma after being found down after fall while home alone. PMH is significant for permanent atrial fibrillation, indwelling pacemaker, protein calorie malnutrition, hypertension, CHFrEF 25-30%, dementia. Pt with rhabdomyolysis after fall.  RD unable to obtain pt nutrition history during time of contact. Per MD, pt consumes Boost supplements at home. Pt currently has Ensure ordered BID. RD to increase Ensure to TID to aid in caloric and protein needs. Unable to complete Nutrition-Focused physical exam at this time.   Labs and medications reviewed.   Diet Order:   Diet Order            Diet Heart Room service appropriate? No; Fluid consistency: Thin  Diet effective now                 EDUCATION NEEDS:   Not appropriate for education at this time  Skin:  Skin Assessment: Reviewed RN Assessment  Last BM:  Unknown  Height:   Ht Readings from Last 1 Encounters:  10/15/20 6\' 1"  (1.854 m)    Weight:   Wt Readings from Last 1 Encounters:  10/17/20 63.5 kg    BMI:  Body mass index is 18.47 kg/m.  Estimated Nutritional Needs:   Kcal:  1650-1850  Protein:  80-90 grams  Fluid:  >/= 1.7 L/day  [name] Parker, MS, RD, LDN RD pager number/after hours weekend pager number on Amion.

## 2020-10-18 DIAGNOSIS — S065X9A Traumatic subdural hemorrhage with loss of consciousness of unspecified duration, initial encounter: Secondary | ICD-10-CM | POA: Diagnosis not present

## 2020-10-18 LAB — CBC WITH DIFFERENTIAL/PLATELET
Abs Immature Granulocytes: 0.01 10*3/uL (ref 0.00–0.07)
Basophils Absolute: 0 10*3/uL (ref 0.0–0.1)
Basophils Relative: 1 %
Eosinophils Absolute: 0.2 10*3/uL (ref 0.0–0.5)
Eosinophils Relative: 3 %
HCT: 32.9 % — ABNORMAL LOW (ref 39.0–52.0)
Hemoglobin: 10.9 g/dL — ABNORMAL LOW (ref 13.0–17.0)
Immature Granulocytes: 0 %
Lymphocytes Relative: 27 %
Lymphs Abs: 1.3 10*3/uL (ref 0.7–4.0)
MCH: 24.2 pg — ABNORMAL LOW (ref 26.0–34.0)
MCHC: 33.1 g/dL (ref 30.0–36.0)
MCV: 72.9 fL — ABNORMAL LOW (ref 80.0–100.0)
Monocytes Absolute: 0.5 10*3/uL (ref 0.1–1.0)
Monocytes Relative: 9 %
Neutro Abs: 3 10*3/uL (ref 1.7–7.7)
Neutrophils Relative %: 60 %
Platelets: 199 10*3/uL (ref 150–400)
RBC: 4.51 MIL/uL (ref 4.22–5.81)
RDW: 17.2 % — ABNORMAL HIGH (ref 11.5–15.5)
WBC: 5 10*3/uL (ref 4.0–10.5)
nRBC: 0 % (ref 0.0–0.2)

## 2020-10-18 LAB — BASIC METABOLIC PANEL
Anion gap: 6 (ref 5–15)
BUN: 19 mg/dL (ref 8–23)
CO2: 21 mmol/L — ABNORMAL LOW (ref 22–32)
Calcium: 8.6 mg/dL — ABNORMAL LOW (ref 8.9–10.3)
Chloride: 112 mmol/L — ABNORMAL HIGH (ref 98–111)
Creatinine, Ser: 1.19 mg/dL (ref 0.61–1.24)
GFR, Estimated: 56 mL/min — ABNORMAL LOW (ref >60)
Glucose, Bld: 68 mg/dL — ABNORMAL LOW (ref 70–99)
Potassium: 5.1 mmol/L (ref 3.5–5.1)
Sodium: 139 mmol/L (ref 135–145)

## 2020-10-18 MED ORDER — SODIUM CHLORIDE 0.9 % IV SOLN: INTRAVENOUS | Status: DC

## 2020-10-18 MED ORDER — SODIUM CHLORIDE 0.9 % IV BOLUS
1000.0000 mL | Freq: Once | INTRAVENOUS | Status: AC
  Administered 2020-10-18: 1000 mL via INTRAVENOUS

## 2020-10-18 NOTE — Progress Notes (Signed)
Physical Therapy Treatment Patient Details Name: [name] [name] MRN: [id number] DOB: 29-Jan-1923 Today's Date: 10/18/2020    History of Present Illness 86 y.o. male presenting with elevated CK and subdural hematoma after being found down after fall while home alone. PMH is significant for permanent atrial fibrillation, indwelling pacemaker, protein calorie malnutrition, hypertension, CHFrEF 25-30%, dementia.    PT Comments    Pt agreeable to participation.  Progressing slowly toward goals.  Emphasis on warm up exercise, transition, sit to stand, pivot transfers with IV pole and gait with IV pole and moderate assist.    Follow Up Recommendations  SNF     Equipment Recommendations  Other (comment) (TBA)    Recommendations for Other Services       Precautions / Restrictions Precautions Precautions: Fall Precaution Comments: R shoulder sling Required Braces or Orthoses: Sling Restrictions RUE Weight Bearing: Non weight bearing    Mobility  Bed Mobility Overal bed mobility: Needs Assistance Bed Mobility: Supine to Sit     Supine to sit: Mod assist     General bed mobility comments: cues for sequencing/direction, pivot assist with the pad, and truncal assist up via L elbow.  pt scooted toward EOB with min guard to min  Transfers Overall transfer level: Needs assistance Equipment used: None Transfers: Sit to/from [facility] Sit to Stand: Mod assist Stand pivot transfers: Mod assist       General transfer comment: assist forward and boosted, use of IV pole to pivot to chair.  Ambulation/Gait Ambulation/Gait assistance: Mod assist Gait Distance (Feet): 6 Feet (x1 and then 8 feet forward and backward with IV pole ) Assistive device: IV Pole Gait Pattern/deviations: Step-to pattern;Step-through pattern;Decreased step length - right;Decreased step length - left;Decreased stride length Gait velocity: slower Gait velocity interpretation: <1.8 ft/sec,  indicate of risk for recurrent falls General Gait Details: mildly unsteady low amplitude/shuffled steps.  Assist needed for w/shift support and stability in general   Stairs             Wheelchair Mobility    Modified Rankin (Stroke Patients Only)       Balance Overall balance assessment: Needs assistance Sitting-balance support: Single extremity supported;Bilateral upper extremity supported;No upper extremity supported Sitting balance-Leahy Scale: Fair     Standing balance support: Single extremity supported;During functional activity Standing balance-Leahy Scale: Poor Standing balance comment: external assist                             Cognition Arousal/Alertness: Awake/alert Behavior During Therapy: WFL for tasks assessed/performed Overall Cognitive Status: History of cognitive impairments - at baseline                                 General Comments: dementia at baseline, pt oriented x3 (disoriented to time), was able to recall and tell OT events leading up to hospitalization; appears to be accurate historian for home setup/PLOF.       Exercises Other Exercises Other Exercises: hip/knee flexion/ext x5 reps with graded resistance  Other Exercises: bicep tricep presses bil with graded resistance x 5 reps.    General Comments General comments (skin integrity, edema, etc.): vss      Pertinent Vitals/Pain Pain Assessment: No/denies pain Pain Intervention(s): Monitored during session    Home Living  Prior Function            PT Goals (current goals can now be found in the care plan section) Acute Rehab PT Goals PT Goal Formulation: With patient Time For Goal Achievement: 10/30/20 Potential to Achieve Goals: Fair Progress towards PT goals: Progressing toward goals    Frequency    Min 2X/week      PT Plan Current plan remains appropriate    Co-evaluation              AM-PAC PT "6  Clicks" Mobility   Outcome Measure  Help needed turning from your back to your side while in a flat bed without using bedrails?: A Little Help needed moving from lying on your back to sitting on the side of a flat bed without using bedrails?: A Lot Help needed moving to and from a bed to a chair (including a wheelchair)?: A Lot Help needed standing up from a chair using your arms (e.g., wheelchair or bedside chair)?: A Lot Help needed to walk in hospital room?: A Lot Help needed climbing 3-5 steps with a railing? : Total 6 Click Score: 12    End of Session   Activity Tolerance: Patient tolerated treatment well;Patient limited by fatigue   Nurse Communication: Mobility status PT Visit Diagnosis: Unsteadiness on feet (R26.81);Other abnormalities of gait and mobility (R26.89);Muscle weakness (generalized) (M62.81)     Time: 4320-0379 PT Time Calculation (min) (ACUTE ONLY): 24 min  Charges:  $Gait Training: 8-22 mins $Therapeutic Activity: 8-22 mins                     10/18/2020  Ginger Carne., PT Acute Rehabilitation Services [phone]  (pager) [phone]  (office)   [name] 10/18/2020, 4:59 PM

## 2020-10-18 NOTE — Progress Notes (Signed)
FPTS Interim Progress Note  Per patient request, spoke with daughter Henderson Newcomer). Provided update. States that she has not been to the hospital to visit him but was notified of how patient was progressing from other family members who have come to the hospital to see him. From others who have visited, daughter has heard positive things and reports that she thinks he is headed in the right direction. She is agreeable to patient going to SNF at this time. States that she really thinks he would benefit from this. She would prefer him to stay in [place] so that he would be close to her. Appreciate SW continued involvement and assistance in this matter.    Donney Dice, DO 10/18/2020, 9:13 AM PGY-1, [facility] Medicine Service pager [phone]

## 2020-10-18 NOTE — NC FL2 (Signed)
[place] MEDICAID FL2 LEVEL OF CARE SCREENING TOOL     IDENTIFICATION  Patient Name: Kapena Hamme Birthdate: 1923-03-30 Sex: male Admission Date (Current Location): 10/15/2020  [facility] and [place] Number:  Herbalist and Address:  [facility], [place] [address] [place], [place] [address]      Provider Number: [id number]  Attending Physician Name and Address:  McDiarmid, Blane Ohara, MD  Relative Name and Phone Number:  Henderson Newcomer    Current Level of Care: Hospital Recommended Level of Care: [place] Prior Approval Number:    Date Approved/Denied:   PASRR Number: [id number]  Discharge Plan: SNF    Current Diagnoses: Patient Active Problem List   Diagnosis Date Noted  . Essential hypertension 10/16/2020  . Lives alone with help available 10/16/2020  . CKD (chronic kidney disease), stage III ([place]) 10/16/2020  . Balance problem 10/16/2020  . Rotator cuff tear arthropathy of right shoulder 10/16/2020  . Superior subluxation of humeral head, right 10/16/2020  . Multiple skin tears   . SDH (subdural hematoma) ([place]) 10/15/2020  . Dementia without behavioral disturbance ([place])   . Fall   . Elevated CK   . AAA (abdominal aortic aneurysm) without rupture ([place]) 06/15/2015  . Constipation 04/30/2014  . Enlarged prostate 03/21/2014  . GERD (gastroesophageal reflux disease) 03/21/2014  . Protein-calorie malnutrition, severe ([place]) 12/08/2013  . Pacemaker-St.Jude 08/26/2012  . Nonischemic/ ischemic cardiomyopathy   . Atrial fibrillation-permanent     Orientation RESPIRATION BLADDER Height & Weight     Self, Time, Situation, Place  Normal External catheter, Incontinent Weight: 136 lb 11 oz (62 kg) Height:  6\' 1"  (185.4 cm)  BEHAVIORAL SYMPTOMS/MOOD NEUROLOGICAL BOWEL NUTRITION STATUS      Incontinent Diet (please see discharge summary)  AMBULATORY STATUS COMMUNICATION OF NEEDS Skin     Verbally Skin abrasions (arm,hand,hip,  knee)                       Personal Care Assistance Level of Assistance  Bathing, Dressing, Feeding Bathing Assistance: Limited assistance Feeding assistance: Independent Dressing Assistance: Limited assistance     Functional Limitations Info  Sight, Hearing, Speech Sight Info: Adequate Hearing Info: Adequate Speech Info: Adequate    SPECIAL CARE FACTORS FREQUENCY  PT (By licensed PT), OT (By licensed OT)     PT Frequency: 5x per week OT Frequency: 5x per week            Contractures Contractures Info: Not present    Additional Factors Info  Code Status, Allergies Code Status Info: FULL Allergies Info: patient has seasonal and environmental allergies           Current Medications (10/18/2020):  This is the current hospital active medication list Current Facility-Administered Medications  Medication Dose Route Frequency Provider Last Rate Last Admin  . 0.9 %  sodium chloride infusion   Intravenous Continuous Brimage, Vondra, DO      . acetaminophen (TYLENOL) tablet 650 mg  650 mg Oral Q6H PRN Simmons-Robinson, Makiera, MD       Or  . acetaminophen (TYLENOL) suppository 650 mg  650 mg Rectal Q6H PRN Simmons-Robinson, Makiera, MD      . cetaphil lotion   Topical Daily Simmons-Robinson, Makiera, MD   Given at 10/18/20 0840  . feeding supplement (ENSURE ENLIVE / ENSURE PLUS) liquid 237 mL  237 mL Oral TID BM McDiarmid, Blane Ohara, MD   237 mL at 10/18/20 0840  . mirtazapine (  REMERON) tablet 30 mg  30 mg Oral QHS Simmons-Robinson, Makiera, MD   30 mg at 10/17/20 2124  . polyethylene glycol (MIRALAX / GLYCOLAX) packet 17 g  17 g Oral Daily PRN Simmons-Robinson, Makiera, MD         Discharge Medications: Please see discharge summary for a list of discharge medications.  Relevant Imaging Results:  Relevant Lab Results:   Additional Information SSN [id number]  Vinie Sill, LCSWA

## 2020-10-18 NOTE — Progress Notes (Addendum)
Family Medicine Teaching Service Daily Progress Note Intern Pager: [phone]  Patient name: [name] [name] Medical record number: [id number] Date of birth: 30-Dec-1922 Age: 86 y.o. Gender: male  Primary Care Provider: Sonia Side., FNP Consultants: Neurosurgery Code Status: Full  Pt Overview and Major Events to Date:  Admitted 10/23  Assessment and Plan: Leshon [name] is a 86 y.o. male presenting with elevated CK and subdural hematoma after being found down after fall while home alone. PMH is significant for permanent atrial fibrillation, indwelling pacemaker, protein calorie malnutrition, hypertension, CHFrEF 25-30%, dementia.   Rhabdomyolysis after fall and prolonged time down AKI Most recent creatinine 1.19, likely resolved. Most recent CK 350 wnl, significantly improved from 753.  - consider discontinuing IV fluids NS at 75 mL/hr -Continue tomonitor Cr and CK daily, pending labs - PT/OT, recommended wheelchair. DME order placed. Also recommended SNF, TOC consult placed. Patient is agreeable to SNF but would like [place] to speak to daughter. Will touch base with daughter today.   Subdural Hematoma, acute  AMS, resolved  Hx of Dementia AOx4, denies any other generalized or localized pain. Palpation of left parietal regions elicits no tenderness. Repeat CT head demonstrated stable low-attenuation subdural collection measuring up to 31mm in thickness along the left cerebral convexity without significant internal expansion or new hyperdense hemorrhage. No acute intracranial abnormality noted. Recommendation of nonemergent outpatient CT or MRA as clinically warranted. Also noted on exam today, facial asymmetry with mumbling speech that rapidly resolved during my visit. CT yesterday noted no acute changes.  - Neurosurgery signed off -Maintainelevated head of bed  -consider repeat CT head if symptoms persist or worsen   Shoulder Pain Likely resolved, denies any current shoulder  pain.  - Tylenol and kpad for pain  - maintain right upper extremity sling  - refer for outpatient orthopedics follow up   CHFrEF 25-30%, stable  Last echo in 2013 with EF estimate of 25-30%. Home medication includes Coreg 3.125 BID. XR chest consistent with cardiomegaly, patient has pacemaker in place. Most recent weight 62 kg compared to 64 kg on admission.  - daily weights  - monitor fluid status   AAA, stable  Most recent aortic US demonstrated 5 cm distal abdominal aneurysm noted, recommended follow up with CT/MRI every 6 months along with vascular consultation. Ultrasound in June of 2020 showed aortic measurement is 5.3 cmnoting that the largest aortic diameter remains essentially unchanged compared to prior exam.  - monitor BP with vitals  -F/u every 6 months for CT/MRI -F/u vascular outpatient   HTN  BP initially hypertensive up to 174/146on admission, most recently blood pressure 109/63. Home meds include Coreg 3.125 BID and ramipril.  - continue home coreg  - hold ramipril with small Cr elevation in setting of elevated CK  - monitor BP  Protein Calorie Malnutrition  Patient drinking home boosts and on mirtazapine 30mg  qHS.  - continue mirtazapine 30mg   -nutrition consult, appreciate recs and involvement   Microcytic anemia  Stable Hgb. Patient admitted with hemoglobin of 10.8, today Hgb 10.9 and MCV low at 72.9. Iron low at 22, TIBC low at 186 and saturation ratio low at 12. U IBC normal at 164. Ferritin within normal range at 318. Values consistent with anemia of chronic disease, possibly due to reactive causes. Malnutrition may also be contributing. -monitor Hgb  Chronic Constipation  Patient with history of constipation on home powder.  - miralax   Permanent Atrial Fibrillation  HR 67 today. EKG on admission consistent with  atrial fibrillation. Patient has indwelling pacemaker. Patient on no anticoagulation currently. Home medication includes plavix  75mg  daily.  - hold plavix in setting of subdural hematoma  - cardiac monitoring     FEN/GI: heart healthy PPx: SCDs   Disposition: anticipate SNF        Subjective:  No significant events reported overnight. Patient denies any concerns today. Discussed no acute findings of repeat CT imaging. He is agreeable to SNF but states that his daughter handles all his affairs and requests that we speak to her as well.   Objective: Temp:  [98.5 F (36.9 C)-99.3 F (37.4 C)] 99.3 F (37.4 C) (10/26 0315) Pulse Rate:  [64-73] 67 (10/26 0315) Resp:  [14-19] 14 (10/26 0315) BP: (107-131)/(59-96) 109/63 (10/26 0315) SpO2:  [96 %-100 %] 100 % (10/26 0315) Weight:  [62 kg] 62 kg (10/26 0500) Physical Exam: General: Patient sleeping comfortably with head of bed elevated, in no acute distress. Cardiovascular: RRR, no murmurs or gallops noted  Respiratory: lungs clear to auscultation bilaterally, breathing comfortably on room air Abdomen: soft, nontender, presence of active bowel sounds  Extremities: dorsalis pedis pulses strong and equal bilaterally, SCDs in place Neuro: AOx4, initially facial asymmetry noted upon awakening and mumbling speech, this rapidly resolved during my visit, difficulty hearing which requires repetition at times  Psych: mood appropriate   Laboratory: Recent Labs  Lab 10/15/20 1448 10/17/20 0624 10/18/20 0438  WBC 6.3 4.7 5.0  HGB 10.8* 11.2* 10.9*  HCT 33.1* 34.3* 32.9*  PLT 222 231 199   Recent Labs  Lab 10/15/20 1448 10/15/20 1448 10/16/20 0349 10/17/20 0624 10/18/20 0438  NA 136   < > 138 139 139  K 4.6   < > 4.7 4.5 5.1  CL 107   < > 107 112* 112*  CO2 19*   < > 23 21* 21*  BUN 21   < > 20 18 19   CREATININE 1.14   < > 1.24 1.21 1.19  CALCIUM 8.5*   < > 8.8* 8.5* 8.6*  PROT 6.5  --   --   --   --   BILITOT 1.3*  --   --   --   --   ALKPHOS 48  --   --   --   --   ALT 13  --   --   --   --   AST 28  --   --   --   --   GLUCOSE 109*   < >  109* 75 68*   < > = values in this interval not displayed.      Imaging/Diagnostic Tests: CT HEAD WO CONTRAST  Result Date: 10/17/2020 CLINICAL DATA:  Follow-up subdural EXAM: CT HEAD WITHOUT CONTRAST TECHNIQUE: Contiguous axial images were obtained from the base of the skull through the vertex without intravenous contrast. COMPARISON:  Radiograph 10/15/2020 FINDINGS: Brain: Redemonstration of a low-attenuation subdural collection along the left cerebral convexity without significant interval expansion or new hyperdense hemorrhage. Again measures up to 7 mm in maximal thickness. 3 mm of rightward mediastinal shift is also unchanged from prior. There is diffuse chronic dural thickening and calcification as well seen across the bilateral convexities. No new extra-axial collection. No hyperdense hemorrhage seen elsewhere. No CT evidence of acute large vascular territory infarct. Patchy areas of white matter hypoattenuation are most compatible with chronic microvascular angiopathy. Symmetric prominence of the ventricles, cisterns and sulci compatible with parenchymal volume loss. Vascular: Atherosclerotic calcification of the carotid siphons  and intradural vertebral arteries. No hyperdense vessel. Possible dolichoectasia of the left carotid at the level of the skull base, similar to prior and incompletely assessed on noncontrast CT exam. Skull: Prior right frontal craniotomy/cranioplasty without acute complication. No significant scalp swelling or hematoma. Left suboccipital intramuscular lipoma similar to prior. Sinuses/Orbits: Paranasal sinuses and mastoid air cells are predominantly clear. Orbits are motion degraded. No acute orbital abnormality is clearly evident. Evidence of prior right lens extraction. Senescent scleral plaques. Other: None. IMPRESSION: 1. Stable low-attenuation subdural collection measuring up to 7 mm in thickness along the left cerebral convexity without significant interval expansion  or new hyperdense hemorrhage. Could reflect chronic subdural or hygroma. Unchanged 3 mm of rightward midline shift. 2. No other acute intracranial abnormality. 3. Chronic microvascular angiopathy and parenchymal volume loss. 4. Prior right frontal craniotomy/cranioplasty without acute complication. 5. Possible dolichoectasia of the left carotid at the level of the skull base, similar to prior and incompletely assessed on noncontrast CT exam. Nonemergent/outpatient CT or MR angiography could be obtained as clinically warranted. Electronically Signed   By: Lovena Le M.D.   On: 10/17/2020 16:09    Donney Dice, DO 10/18/2020, 6:46 AM PGY-1, [place] Intern pager: [phone], text pages welcome

## 2020-10-18 NOTE — Progress Notes (Signed)
   10/18/20 1230  Urine Characteristics  Urinary Interventions Bladder scan  Bladder Scan Volume (mL) 439 mL   Pt states he is not in any discomfort and does not have to void at this time. He requested to give him some time to try. Will continue to monitor.

## 2020-10-18 NOTE — Discharge Summary (Addendum)
[facility] Discharge Summary  Patient name: [name] [name] Medical record number: [id number] Date of birth: 19-Jul-1923 Age: 86 y.o. Gender: male Date of Admission: 10/15/2020  Date of Discharge: 10/28/2020 Admitting Physician: Blane Ohara McDiarmid, MD  Primary Care Provider: Sonia Side., FNP Consultants: Neurosurgery, palliative care   Indication for Hospitalization: subdural hematoma with AMS following fall   Discharge Diagnoses/Problem List:  Subdural hematoma  AKI Rhabdomyolysis  Shoulder pain Hypotension  History of dementia  AAA CHFrEF HTN Protein calorie malnutrition  Microcytic anemia Chronic constipation  Atrial fibrillation    Disposition: SNF  Discharge Condition: medically stable   Discharge Exam:   Physical exam General: Patient sitting upright in bed, well-appearing and in no acute distress. CV: RRR, no murmurs or gallops auscultated Resp: lungs clear to auscultation bilaterally, no wheezing or crackles noted, breathing comfortably on room air Abdomen: soft, nontender, presence of active bowel sounds Derm: skin warm and dry to touch, no new rashes or lesions noted Ext: radial and dorsalis pedis strong and equal bilaterally, no LE edema noted bilaterally Neuro: AOx4, very pleasant Psych: mood appropriate    Brief Hospital Course:  [name] [name] is a 86 y.o. male who presented after a fall and prolonged time down that resulted in a subdural hematoma and rhabdomyolysis. PMH is significant for permanent atrial fibrillation, indwelling pacemaker, protein calorie malnutrition, hypertension, CHFrEF 25-30%, dementia.  Rhabdomyolysis Patient was initially treated with IVF due to elevated CK of 753 on admission which normalized at 350 well prior to admission. His initial Cr was 1.14 and remained stable throughout hospitalization. Patient was evaluated by PT/OT and recommended SNF placement. Patient presented from home and lived  independently at the time of the fall; using a walker for mobilization around his home.   Subdural Hematoma  Shoulder pain  Patient sustained a head injury during his fall resulting in a left cerebral subdural hematoma. He was evaluated by neurosurgery in the ED who recommended repeat head CT in 24 hours and cessation of any AC or antiplatelet therapy. Presented with right shoulder pain on admission, placed in sling for stabilization. Pain controlled with tylenol and K pad, pain significantly improved. Patient's home Plavix was held. Repeat head CT showed stable low-attenuation subdural collection measuring up to 7 mm in thickness along the left cerebral convexity without significant internal expansion or new hyperdense hemorrhage, no acute intracranial abnormality noted. Recommended to repeat nonemergent/outpatient CT or MRA only if clinically warranted. During this hospital stay, he was on cardiac monitors and placed in the progressive unit for frequent vitals and neuro checks. Patient maintained AOx4 status well prior to discharge. Patient remained neurologically stable throughout this admission and was discharged to SNF.   Hypotension Once IVF discontinued, patient experienced multiple episodes of hypotension. Standing BP 90/47. Orthostatic vitals negative. Fluids placed back on and gradually weaned off. BP closely monitored and remained normotensive throughout hospital stay. Started on midodrine 2.5 mg tid. Patient's blood pressures improved and remained stable throughout hospital stay.   Severe Protein Calorie Malnutrition Patient noted to have decreased appetite prior to admission with cachetic appearance and muscle wasting on exam. Nutrition was consulted and recommended increase in Ensure to increase caloric intake and meet protein needs.   AAA History of AAA, stable. Most recent aortic US on 10/16/2020 demonstrated 5 cm distal abdominal aneurysm noted, recommended to follow up with CT/MR every  6 months along with vascular outpatient follow up. Comparable ultrasound in June 2021 demonstrated aortic measurement of 5.3  cm noting that the largest aortic diameter remains essentially unchanged compared to prior exam.   All other issues chronic and stable.   Issues for Follow Up:  1. Follow up with repeat CT/MR every 6 months per most recent US aorta. 2. Refer to vascular outpatient per most recent US aorta for regular follow up.  3. Patient was initially on plavix 75 mg daily for atrial fibrillation prior to admission, held at discharge given bleeding risk. Consider restarting in 2-3 weeks. 4. Follow up with orthopedics outpatient for right upper extremity sling.   5. Repeat CT head or MRA only if clinically warranted.    Significant Procedures:  none  Significant Labs and Imaging:  Recent Labs  Lab 10/22/20 0507 10/25/20 0621  WBC 4.4 3.4*  HGB 11.1* 11.4*  HCT 34.3* 35.2*  PLT 188 220   Recent Labs  Lab 10/22/20 0507 10/22/20 0507 10/24/20 0936 10/25/20 0621  NA 139  --  137 139  K 5.1   < > 4.7 4.8  CL 111  --  108 107  CO2 19*  --  22 23  GLUCOSE 73  --  72 80  BUN 17  --  17 19  CREATININE 1.25*  --  1.16 1.10  CALCIUM 8.5*  --  8.5* 8.9   < > = values in this interval not displayed.      Results/Tests Pending at Time of Discharge:  No results found.  Discharge Medications:  Allergies as of 10/28/2020      Reactions   Other Other (See Comments)   The patient has seasonal and/or environmental allergies- Runny nose      Medication List    STOP taking these medications   carvedilol 3.125 MG tablet Commonly known as: COREG   clopidogrel 75 MG tablet Commonly known as: PLAVIX   docusate sodium 100 MG capsule Commonly known as: COLACE   gabapentin 100 MG capsule Commonly known as: NEURONTIN   GaviLAX 17 GM/SCOOP powder Generic drug: polyethylene glycol powder Replaced by: polyethylene glycol 17 g packet   ramipril 1.25 MG capsule Commonly  known as: ALTACE     TAKE these medications   diclofenac sodium 1 % Gel Commonly known as: VOLTAREN Apply 4 g topically 4 (four) times daily as needed (to affected/painful sites).   feeding supplement Liqd Take 237 mLs by mouth 3 (three) times daily between meals.   midodrine 2.5 MG tablet Commonly known as: PROAMATINE Take 1 tablet (2.5 mg total) by mouth 3 (three) times daily with meals.   mirtazapine 30 MG tablet Commonly known as: REMERON Take 30 mg by mouth at bedtime.   polyethylene glycol 17 g packet Commonly known as: MIRALAX / GLYCOLAX Take 17 g by mouth daily. Start taking on: October 29, 2020 Replaces: GNFAOZH 17 GM/SCOOP powder            Durable Medical Equipment  (From admission, onward)         Start     Ordered   10/17/20 0951  For home use only DME standard manual wheelchair with seat cushion  Once       Comments: Patient suffers from recurrent falls and dementia which impairs their ability to perform daily activities like walking in the home.  A walker will not resolve issue with performing activities of daily living. A wheelchair will allow patient to safely perform daily activities. Patient can safely propel the wheelchair in the home or has a caregiver who can provide assistance. Length  of need for long-term use. Accessories: elevating leg rests (ELRs), wheel locks, extensions and anti-tippers.   10/17/20 0951           Discharge Care Instructions  (From admission, onward)         Start     Ordered   10/28/20 0000  Discharge wound care:       Comments: Monitor for signs of infection   10/28/20 1341          Discharge Instructions: Please refer to Patient Instructions section of EMR for full details.  Patient was counseled important signs and symptoms that should prompt return to medical care, changes in medications, dietary instructions, activity restrictions, and follow up appointments.   Follow-Up Appointments:  Contact information  for after-discharge care    Destination    [facility] Preferred SNF .   Service: Skilled Nursing Contact information: [address] [place]                  Gifford Shave, MD 10/28/2020, 1:44 PM PGY-2, [facility]

## 2020-10-19 DIAGNOSIS — S065X9A Traumatic subdural hemorrhage with loss of consciousness of unspecified duration, initial encounter: Secondary | ICD-10-CM | POA: Diagnosis not present

## 2020-10-19 LAB — CBC WITH DIFFERENTIAL/PLATELET
Abs Immature Granulocytes: 0.01 10*3/uL (ref 0.00–0.07)
Basophils Absolute: 0 10*3/uL (ref 0.0–0.1)
Basophils Relative: 1 %
Eosinophils Absolute: 0.2 10*3/uL (ref 0.0–0.5)
Eosinophils Relative: 4 %
HCT: 35.8 % — ABNORMAL LOW (ref 39.0–52.0)
Hemoglobin: 11.3 g/dL — ABNORMAL LOW (ref 13.0–17.0)
Immature Granulocytes: 0 %
Lymphocytes Relative: 36 %
Lymphs Abs: 1.6 10*3/uL (ref 0.7–4.0)
MCH: 23.8 pg — ABNORMAL LOW (ref 26.0–34.0)
MCHC: 31.6 g/dL (ref 30.0–36.0)
MCV: 75.5 fL — ABNORMAL LOW (ref 80.0–100.0)
Monocytes Absolute: 0.5 10*3/uL (ref 0.1–1.0)
Monocytes Relative: 11 %
Neutro Abs: 2.1 10*3/uL (ref 1.7–7.7)
Neutrophils Relative %: 48 %
Platelets: 203 10*3/uL (ref 150–400)
RBC: 4.74 MIL/uL (ref 4.22–5.81)
RDW: 17.6 % — ABNORMAL HIGH (ref 11.5–15.5)
WBC: 4.4 10*3/uL (ref 4.0–10.5)
nRBC: 0 % (ref 0.0–0.2)

## 2020-10-19 LAB — BASIC METABOLIC PANEL
Anion gap: 7 (ref 5–15)
BUN: 17 mg/dL (ref 8–23)
CO2: 19 mmol/L — ABNORMAL LOW (ref 22–32)
Calcium: 8.2 mg/dL — ABNORMAL LOW (ref 8.9–10.3)
Chloride: 113 mmol/L — ABNORMAL HIGH (ref 98–111)
Creatinine, Ser: 1.17 mg/dL (ref 0.61–1.24)
GFR, Estimated: 57 mL/min — ABNORMAL LOW (ref >60)
Glucose, Bld: 90 mg/dL (ref 70–99)
Potassium: 5.3 mmol/L — ABNORMAL HIGH (ref 3.5–5.1)
Sodium: 139 mmol/L (ref 135–145)

## 2020-10-19 MED ORDER — SODIUM CHLORIDE 0.9 % IV BOLUS
500.0000 mL | Freq: Once | INTRAVENOUS | Status: AC
  Administered 2020-10-19: 500 mL via INTRAVENOUS

## 2020-10-19 MED ORDER — SODIUM CHLORIDE 0.9 % IV SOLN: INTRAVENOUS | Status: DC

## 2020-10-19 NOTE — Progress Notes (Signed)
Occupational Therapy Treatment Patient Details Name: [name] [name] MRN: [id number] DOB: [birth year] Today's Date: 10/19/2020    History of present illness 86 y.o. male presenting with elevated CK and subdural hematoma after being found down after fall while home alone. PMH is significant for permanent atrial fibrillation, indwelling pacemaker, protein calorie malnutrition, hypertension, CHFrEF 25-30%, dementia.   OT comments  Pt with gradual progress towards OT goals, presents supine in bed pleasant and willing to participate in therapy session. Pt performing functional transfers with modA, tolerating OOB to recliner. Pt engaging in additional RUE HEP to elbow/forearm/wrist/hand. Setup for breakfast end of session. Feel SNF recommendation remains most appropriate at this time. Will continue to follow acutely.  **Contacted attending Resident regarding Wt bearing and ROM status of RUE post-session as noted recent RUE imaging with no acute fractures, have now received verbal clearance for WBAT and gentle ROM to R shoulder as able to tolerate, sling only for comfort. Given updated WB status am hopeful for further progress as pt can now use DME such as RW.    Follow Up Recommendations  SNF;Supervision/Assistance - 24 hour    Equipment Recommendations  3 in 1 bedside commode;Other (comment) (TBD)          Precautions / Restrictions Precautions Precautions: Fall Precaution Comments: R shoulder sling Required Braces or Orthoses: Sling Restrictions Weight Bearing Restrictions: Yes RUE Weight Bearing: Weight bearing as tolerated       Mobility Bed Mobility Overal bed mobility: Needs Assistance Bed Mobility: Supine to Sit     Supine to sit: Mod assist     General bed mobility comments: cues for sequencing/direction, pivot assist with the pad, and truncal assist.  pt scooted toward EOB with MinA  Transfers Overall transfer level: Needs assistance Equipment used: None Transfers: Sit  to/from [facility] Sit to Stand: Mod assist Stand pivot transfers: Mod assist       General transfer comment: assist forward and boosted;     Balance Overall balance assessment: Needs assistance Sitting-balance support: Single extremity supported;Bilateral upper extremity supported;No upper extremity supported Sitting balance-Leahy Scale: Fair     Standing balance support: Single extremity supported;During functional activity Standing balance-Leahy Scale: Poor Standing balance comment: external assist                            ADL either performed or assessed with clinical judgement   ADL Overall ADL's : Needs assistance/impaired Eating/Feeding: Set up;Sitting Eating/Feeding Details (indicate cue type and reason): setup with breakfast tray end of session; pt requiring assist to open containers but is able to self-feed, using LUE to perform task  Grooming: Set up;Sitting                               Functional mobility during ADLs: Moderate assistance (stand pivot, HHA)                         Cognition Arousal/Alertness: Awake/alert Behavior During Therapy: WFL for tasks assessed/performed Overall Cognitive Status: History of cognitive impairments - at baseline                                 General Comments: dementia at basline, pleasant and cooperative during session. does not appear to remember OT from previous session. able to recall events leading  up to hospitalization         Exercises Exercises: General Upper Extremity General Exercises - Upper Extremity Elbow Flexion: AAROM;Right;10 reps;Seated Elbow Extension: AAROM;Right;10 reps;Seated Wrist Flexion: AROM;Right;10 reps;Seated Wrist Extension: AROM;Right;10 reps;Seated Digit Composite Flexion: AROM;Right;10 reps Composite Extension: AROM;Right;10 reps Other Exercises Other Exercises: forearm pronation/supination, AROM, RUE, x10   Shoulder  Instructions       General Comments      Pertinent Vitals/ Pain       Pain Assessment: Faces Faces Pain Scale: Hurts a little bit Pain Location: generalized Pain Descriptors / Indicators: Discomfort Pain Intervention(s): Monitored during session;Repositioned  Home Living                                          Prior Functioning/Environment              Frequency  Min 2X/week        Progress Toward Goals  OT Goals(current goals can now be found in the care plan section)  Progress towards OT goals: Progressing toward goals  Acute Rehab OT Goals Patient Stated Goal: To go to rehab to get stronger OT Goal Formulation: With patient Time For Goal Achievement: 10/31/20 Potential to Achieve Goals: Good ADL Goals Pt Will Perform Grooming: with supervision;sitting Pt Will Perform Upper Body Bathing: with supervision;sitting Pt Will Perform Upper Body Dressing: with min assist;sitting Pt Will Perform Lower Body Dressing: with min assist;sit to/from stand Pt Will Transfer to Toilet: with min assist;stand pivot transfer;ambulating Pt Will Perform Toileting - Clothing Manipulation and hygiene: with min assist;sit to/from stand;sitting/lateral leans Pt/caregiver will Perform Home Exercise Program: Increased ROM;Increased strength;Right Upper extremity;With minimal assist;With written HEP provided  Plan Discharge plan remains appropriate    Co-evaluation                 AM-PAC OT "6 Clicks" Daily Activity     Outcome Measure   Help from another person eating meals?: A Little Help from another person taking care of personal grooming?: A Little Help from another person toileting, which includes using toliet, bedpan, or urinal?: A Lot Help from another person bathing (including washing, rinsing, drying)?: A Lot Help from another person to put on and taking off regular upper body clothing?: A Lot Help from another person to put on and taking off  regular lower body clothing?: A Lot 6 Click Score: 14    End of Session Equipment Utilized During Treatment: Other (comment);Gait belt (sling)  OT Visit Diagnosis: Other abnormalities of gait and mobility (R26.89);History of falling (Z91.81);Muscle weakness (generalized) (M62.81)   Activity Tolerance Patient tolerated treatment well   Patient Left in chair;with call bell/phone within reach;with chair alarm set   Nurse Communication Mobility status        Time: 4235-3614 OT Time Calculation (min): 33 min  Charges: OT General Charges $OT Visit: 1 Visit OT Treatments $Self Care/Home Management : 8-22 mins $Therapeutic Activity: 8-22 mins  Lou Cal, OT Acute Rehabilitation Services Pager [phone] Office [phone]   Raymondo Band 10/19/2020, 1:07 PM

## 2020-10-19 NOTE — Progress Notes (Signed)
FPTS Interim Progress Note  Patient agreeable to palliative care consult, wanted to touch base with daughter as well. Patient's daughter is open to palliative consult at this time. Consulted palliative care, appreciate their involvement and assistance.   Donney Dice, DO 10/19/2020, 1:30 PM PGY-1, [facility] pager [phone]

## 2020-10-19 NOTE — Progress Notes (Signed)
   10/19/20 1308  Vitals  BP (!) 90/47  MAP (mmHg) (!) 61  BP Location Left Arm  BP Method Automatic  Patient Position (if appropriate) Lying  Pulse Rate 66  Pulse Rate Source Monitor  ECG Heart Rate 77  Resp 18   Standing BP. MD notified per order.

## 2020-10-19 NOTE — Progress Notes (Signed)
   10/19/20 1810  Orthostatic Lying   BP- Lying 121/71  Pulse- Lying 68  Orthostatic Sitting  BP- Sitting 111/68  Pulse- Sitting 67  Orthostatic Standing at 0 minutes  BP- Standing at 0 minutes 115/68  Pulse- Standing at 0 minutes 81  Orthostatic vitals

## 2020-10-19 NOTE — Progress Notes (Addendum)
Family Medicine Teaching Service Daily Progress Note Intern Pager: [phone]  Patient name: [name] [name] Medical record number: [id number] Date of birth: September 17, 1923 Age: 86 y.o. Gender: male  Primary Care Provider: Sonia Side., FNP Consultants: Neurosurgery Code Status: Full  Pt Overview and Major Events to Date:  Admitted 10/23  Assessment and Plan: [name] [name] is a 86 y.o. male presenting with elevated CK and subdural hematoma after being found down after fall while home alone. PMH is significant for permanent atrial fibrillation, indwelling pacemaker, protein calorie malnutrition, hypertension, CHFrEF 25-30%, dementia.   Rhabdomyolysis after fall and prolonged time down  AKI Most recent creatinine 1.17, likely resolved. Most recent CK 350 wnl, significantly improved from 753. -Continue tomonitor Cr and CK daily, pending labs - PT/OT, recommended wheelchair. DME order placed. Also recommended SNF, TOC consult placed. Patient and daughter are both agreeable to SNF.   -consider palliative consult, patient open to this, attempted to call daughter but no answer, left a message and will try calling again   Hypotension Episodes of hypotension noted since IV NS fluids discontinued yesterday. Given NS bolus 500 mL and then continuous NS fluids. Discontinued home coreg. Current BP 115/65.  -continue IV NS 100 mL/hr -no antihypertensives -monitor BP  Subdural Hematoma, acute   AMS, resolved   Hx of Dementia AOx4, denies any other generalized or localized pain. Palpation of left parietal regions elicits no tenderness. Repeat CT head demonstrated stable low-attenuation subdural collection measuring up to 16mm in thickness along the left cerebral convexity without significant internal expansion or new hyperdense hemorrhage. No acute intracranial abnormality noted. Recommendation of nonemergent outpatient CT or MRA as clinically warranted. CT head 10/25 noted no acute changes.  -  Neurosurgerysigned off -Maintainelevated head of bed   Shoulder Pain Likely resolved, denies any current shoulder pain. - Tylenol and kpad for pain  - maintain right upper extremity sling  - refer for outpatient orthopedics follow up   CHFrEF 25-30%, stable  Last echo in 2013 with EF estimate of 25-30%. Home medication includes Coreg 3.125 BID. XR chest consistent with cardiomegaly, patient has pacemaker in place.Most recent weight 64 kg stable from admission. - daily weights  - monitor fluid status   AAA, stable Most recent aortic US demonstrated 5 cm distal abdominal aneurysm noted, recommended follow up with CT/MRI every 6 months along with vascular consultation.Ultrasound in June of 2020 showed aortic measurement is 5.3 cmnoting that the largest aortic diameter remains essentially unchanged compared to prior exam.  - monitor BP with vitals -F/u every 6 months for CT/MRI -F/u vascular outpatient  HTN  BP initially hypertensive up to 174/146on admission, most recently blood pressure115/65. Home meds include Coreg 3.125 BID and ramipril. - hold home coregdue to hypotension  - hold ramipril with small Cr elevation in setting of elevated CK  - monitor BP  Protein Calorie Malnutrition  Per patient, he is eating and drinking appropriately. Patient drinking home boosts and on mirtazapine 30mg  qHS.  - continue mirtazapine 30mg   -continue ensure supplements  -nutrition consult, appreciate recs and involvement  Microcytic anemia  Stable Hgb. Patient admitted with hemoglobin of 10.8, today Hgb 11.3 and MCV low at 75.5. Iron low at 22, TIBC low at 186 and saturation ratio low at 12. U IBC normal at 164. Ferritin within normal range at 318. Values consistent with anemia of chronic disease, possibly due to reactive causes. Malnutrition may also be contributing. -monitor Hgb  Chronic Constipation  Patient with history of constipation on  home powder.  - miralax    Permanent Atrial Fibrillation HR 66 today.EKG on admission consistent with atrial fibrillation. Patient has indwelling pacemaker. Patient on no anticoagulation currently. Home medication includes plavix 75mg  daily.  - hold plavix in setting of subdural hematoma, hold on discharge    - cardiac monitoring   FEN/GI: heart healthy  PPx: SCDs   Disposition: awaiting SNF placement, appreciate assistance from SW       Subjective:  No acute events reported overnight. Patient denies nausea, vomiting, chest pain, dyspnea, headache, weakness and dizziness. States that he has no concerns today. Discussed consulting palliative care with patient, he is agreeable to this but would like [place] to touch base with daughter as well.   Objective: Temp:  [97.5 F (36.4 C)-98.2 F (36.8 C)] 98.2 F (36.8 C) (10/27 0328) Pulse Rate:  [65-68] 66 (10/27 0328) Resp:  [15-20] 17 (10/27 0328) BP: (90-115)/(50-65) 115/65 (10/27 0328) SpO2:  [98 %-100 %] 100 % (10/27 0328) Weight:  [64 kg] 64 kg (10/27 0500) Physical Exam: General: Patient sitting upright in bed, in no acute distress. HEENT: supple neck, left parietal region nontender on palpation  Cardiovascular: RRR, no murmurs or gallops auscultated  Respiratory: lungs clear to auscultation bilaterally, breathing comfortably on room air without signs of respiratory distress Abdomen: soft, nontender, presence of active bowel sounds MSK: right UE in sling Extremities: radial and distal pulses intact bilaterally, SCDs in place Derm: skin warm and dry to touch, no rashes or lesions noted Neuro: AOx4, no facial asymmetry noted, coherent speech Psych: mood appropriate, pleasant   Laboratory: Recent Labs  Lab 10/17/20 0624 10/18/20 0438 10/19/20 0146  WBC 4.7 5.0 4.4  HGB 11.2* 10.9* 11.3*  HCT 34.3* 32.9* 35.8*  PLT 231 199 203   Recent Labs  Lab 10/15/20 1448 10/16/20 0349 10/17/20 0624 10/18/20 0438 10/19/20 0146  NA 136   < > 139  139 139  K 4.6   < > 4.5 5.1 5.3*  CL 107   < > 112* 112* 113*  CO2 19*   < > 21* 21* 19*  BUN 21   < > 18 19 17   CREATININE 1.14   < > 1.21 1.19 1.17  CALCIUM 8.5*   < > 8.5* 8.6* 8.2*  PROT 6.5  --   --   --   --   BILITOT 1.3*  --   --   --   --   ALKPHOS 48  --   --   --   --   ALT 13  --   --   --   --   AST 28  --   --   --   --   GLUCOSE 109*   < > 75 68* 90   < > = values in this interval not displayed.      Imaging/Diagnostic Tests: No results found.  Donney Dice, DO 10/19/2020, 6:36 AM PGY-1, [place] Intern pager: [phone], text pages welcome

## 2020-10-20 DIAGNOSIS — Z7189 Other specified counseling: Secondary | ICD-10-CM

## 2020-10-20 DIAGNOSIS — S065X9A Traumatic subdural hemorrhage with loss of consciousness of unspecified duration, initial encounter: Secondary | ICD-10-CM | POA: Diagnosis not present

## 2020-10-20 DIAGNOSIS — Z66 Do not resuscitate: Secondary | ICD-10-CM

## 2020-10-20 DIAGNOSIS — Z515 Encounter for palliative care: Secondary | ICD-10-CM

## 2020-10-20 DIAGNOSIS — F0390 Unspecified dementia without behavioral disturbance: Secondary | ICD-10-CM

## 2020-10-20 LAB — BASIC METABOLIC PANEL
Anion gap: 8 (ref 5–15)
BUN: 16 mg/dL (ref 8–23)
CO2: 17 mmol/L — ABNORMAL LOW (ref 22–32)
Calcium: 8.4 mg/dL — ABNORMAL LOW (ref 8.9–10.3)
Chloride: 116 mmol/L — ABNORMAL HIGH (ref 98–111)
Creatinine, Ser: 1.08 mg/dL (ref 0.61–1.24)
GFR, Estimated: >60 mL/min (ref >60)
Glucose, Bld: 89 mg/dL (ref 70–99)
Potassium: 4.9 mmol/L (ref 3.5–5.1)
Sodium: 141 mmol/L (ref 135–145)

## 2020-10-20 LAB — CBC WITH DIFFERENTIAL/PLATELET
Abs Immature Granulocytes: 0.01 10*3/uL (ref 0.00–0.07)
Basophils Absolute: 0 10*3/uL (ref 0.0–0.1)
Basophils Relative: 1 %
Eosinophils Absolute: 0.3 10*3/uL (ref 0.0–0.5)
Eosinophils Relative: 7 %
HCT: 32.4 % — ABNORMAL LOW (ref 39.0–52.0)
Hemoglobin: 10.6 g/dL — ABNORMAL LOW (ref 13.0–17.0)
Immature Granulocytes: 0 %
Lymphocytes Relative: 32 %
Lymphs Abs: 1.2 10*3/uL (ref 0.7–4.0)
MCH: 24.7 pg — ABNORMAL LOW (ref 26.0–34.0)
MCHC: 32.7 g/dL (ref 30.0–36.0)
MCV: 75.3 fL — ABNORMAL LOW (ref 80.0–100.0)
Monocytes Absolute: 0.4 10*3/uL (ref 0.1–1.0)
Monocytes Relative: 11 %
Neutro Abs: 1.7 10*3/uL (ref 1.7–7.7)
Neutrophils Relative %: 49 %
Platelets: 206 10*3/uL (ref 150–400)
RBC: 4.3 MIL/uL (ref 4.22–5.81)
RDW: 17.8 % — ABNORMAL HIGH (ref 11.5–15.5)
WBC: 3.6 10*3/uL — ABNORMAL LOW (ref 4.0–10.5)
nRBC: 0 % (ref 0.0–0.2)

## 2020-10-20 MED ORDER — BENZOCAINE 10 % MT GEL
Freq: Two times a day (BID) | OROMUCOSAL | Status: DC | PRN
  Filled 2020-10-20: qty 9

## 2020-10-20 MED ORDER — POLYETHYLENE GLYCOL 3350 17 G PO PACK
17.0000 g | PACK | Freq: Every day | ORAL | Status: DC
  Administered 2020-10-20 – 2020-10-28 (×8): 17 g via ORAL
  Filled 2020-10-20 (×8): qty 1

## 2020-10-20 NOTE — Progress Notes (Signed)
[facility] ([facility])  Hospital Liaison: RN note         Notified by TOC manager of patient/family request for [facility] Palliative services at home after discharge.                  [facility] Palliative team will follow up with patient after discharge.         Please call with any hospice or palliative related questions.         Thank you for this referral.         Mary Anne Robertson, RN, CCM  [facility] Hospital Liaison (listed on [facility] under Hospice[facility])    [phone]   

## 2020-10-20 NOTE — Progress Notes (Addendum)
Family Medicine Teaching Service Daily Progress Note Intern Pager: [phone]  Patient name: [name] [name] Medical record number: [id number] Date of birth: 1923-03-25 Age: 86 y.o. Gender: male  Primary Care Provider: Sonia Side., FNP Consultants: Neurosurgery, palliative care  Code Status: Full  Pt Overview and Major Events to Date:  Admitted 10/23  Assessment and Plan: [name] [name] is a 86 y.o. male presenting with elevated CK and subdural hematoma after being found down after fall while home alone. PMH is significant for permanent atrial fibrillation, indwelling pacemaker, protein calorie malnutrition, hypertension, CHFrEF 25-30%, dementia.   Rhabdomyolysis after fall and prolonged time down AKI Most recent creatinine 1.08, resolved. Most recent CK350 wnl, significantlyimprovedfrom 753. - PT/OT, recommended wheelchair. DME order placed. Also recommended SNF, TOC consult placed.Patient and daughter are both agreeable to SNF. Per nurse, received bed placement at [facility], awaiting insurance authorization.  -palliative care consulted, appreciate involvement and recommendations  Hypotension Episodes of hypotension noted since IV NS fluids discontinued yesterday. Given NS bolus 500 mL and then continuous NS fluids. Discontinued home coreg. Standing BP 90/47.  Orthostatics: lying 121/71, sitting 111/68 and 115/68.  Current BP 121/58.  -decrease to  IV NS 50 mL/hr -no antihypertensives -monitor BP  Subdural Hematoma, acute  AMS, resolved  Hx of Dementia AOx4, denies any other generalized or localized pain. Palpation of left parietal regions elicitsnotenderness.Repeat CT head demonstrated stable low-attenuation subdural collection measuring up to 14mm in thickness along the left cerebral convexity without significant internal expansion or new hyperdense hemorrhage. No acute intracranial abnormality noted. Recommendation of nonemergent outpatient CT or MRA as  clinically warranted. CT head 10/25 noted no acute changes. - Neurosurgerysigned off -Maintainelevated head of bed  Shoulder Pain Likely resolved, denies any current shoulder pain.Seen by OT on 10/26, no evidence of fracture or dislocation.  - Tylenol and kpad for pain  -begin gradual weight bearing with sling use for comfort to prevent frozen shoulder   - refer for outpatient orthopedics follow up   CHFrEF 25-30%, stable  Last echo in 2013 with EF estimate of 25-30%. Home medication includes Coreg 3.125 BID. XR chest consistent with cardiomegaly, patient has pacemaker in place.Most recent weight 66.6kg  from admission at 64 kg. - daily weights  - monitor fluid status   AAA Stable. Most recent aortic US demonstrated 5 cm distal abdominal aneurysm noted, recommended follow up with CT/MRI every 6 months along with vascular consultation.Ultrasound in June of 2020 showed aortic measurement is 5.3 cmnoting that the largest aortic diameter remains essentially unchanged compared to prior exam.  - monitor BP with vitals -F/u every 6 months for CT/MRI -F/u vascular outpatient  HTN  BP initially hypertensive up to 174/146on admission, most recently blood pressure121/58. Home meds include Coreg 3.125 BID and ramipril. - hold home coregdue to hypotension  - hold ramipril with small Cr elevation in setting of elevated CK  - monitor BP  Protein Calorie Malnutrition  Per patient, he is eating and drinking appropriately. Reports that when he fell, he hit his mouth causing a cut in his gums that makes it hurt to chew certain foods. States that he has been eating well thus far. Patient drinking home boosts and on mirtazapine 30mg  qHS.  - continue mirtazapine 30mg   -continue ensure supplements  -nutrition consult, appreciate recs and involvement -continue to encourage adequate PO intake   Microcytic anemia  Stable Hgb.Patient admitted with hemoglobin of 10.8, today Hgb  10.6and MCV low at 75.3. Iron low at 22, TIBC  low at 186 and saturation ratio low at 12. U IBC normal at 164. Ferritin within normal range at 318. Values consistent with anemia of chronic disease, possibly due to reactive causes. Malnutrition may also be contributing. -monitor Hgb  Chronic Constipation  Patient with history of constipation on home powder.  - miralax   Permanent Atrial Fibrillation HR 63 today.EKG on admission consistent with atrial fibrillation. Patient has indwelling pacemaker. Patient on no anticoagulation currently. Home medication includes plavix 75mg  daily.  - hold plavix in setting of subdural hematoma, hold on discharge    - cardiac monitoring   FEN/GI: heart healthy  PPx: SCDs   Disposition: pending SNF         Subjective:  No acute overnight events. Per nurse, patient has been getting up occasionally and sitting in the chair for awhile without any issues. Denies dizziness or weakness. SCDs not in place, asked the nurse to keep SCDs on when patient is in the bed.   Objective: Temp:  [97.4 F (36.3 C)-98.3 F (36.8 C)] 98.3 F (36.8 C) (10/28 0339) Pulse Rate:  [63-70] 63 (10/28 0339) Resp:  [16-20] 16 (10/28 0339) BP: (90-122)/(47-76) 121/58 (10/28 0339) SpO2:  [94 %-100 %] 94 % (10/28 0339) Weight:  [66.6 kg] 66.6 kg (10/28 0540) Physical Exam: General: Patient sleeping comfortably in bed, in no acute distress. Cardiovascular: RRR, no murmurs or gallops auscultated  Respiratory: lungs clear to auscultation bilaterally, breathing without signs of distress on room air  Abdomen: soft, nontender, presence of active bowel sounds MSK: right UE placed in sling  Extremities: dorsalis pedis pulses intact bilaterally, no LE edema noted bilaterally Neuro: AOx4, appropriately conversational Psych: mood appropriate   Laboratory: Recent Labs  Lab 10/18/20 0438 10/19/20 0146 10/20/20 0102  WBC 5.0 4.4 3.6*  HGB 10.9* 11.3* 10.6*  HCT 32.9*  35.8* 32.4*  PLT 199 203 206   Recent Labs  Lab 10/15/20 1448 10/16/20 0349 10/18/20 0438 10/19/20 0146 10/20/20 0102  NA 136   < > 139 139 141  K 4.6   < > 5.1 5.3* 4.9  CL 107   < > 112* 113* 116*  CO2 19*   < > 21* 19* 17*  BUN 21   < > 19 17 16   CREATININE 1.14   < > 1.19 1.17 1.08  CALCIUM 8.5*   < > 8.6* 8.2* 8.4*  PROT 6.5  --   --   --   --   BILITOT 1.3*  --   --   --   --   ALKPHOS 48  --   --   --   --   ALT 13  --   --   --   --   AST 28  --   --   --   --   GLUCOSE 109*   < > 68* 90 89   < > = values in this interval not displayed.      Imaging/Diagnostic Tests: No results found.  Donney Dice, DO 10/20/2020, 6:40 AM PGY-1, [place] Intern pager: [phone], text pages welcome

## 2020-10-20 NOTE — Care Management Important Message (Signed)
Important Message  Patient Details  Name: [name] [name] MRN: [id number] Date of Birth: 01-21-1923   Medicare Important Message Given:  Yes     Tamecka Milham 10/20/2020, 9:45 AM

## 2020-10-20 NOTE — Progress Notes (Signed)
Physical Therapy Treatment Patient Details Name: [name] [name] MRN: [id number] DOB: Dec 12, 1923 Today's Date: 10/20/2020    History of Present Illness 86 y.o. male presenting with elevated CK and subdural hematoma after being found down after fall while home alone. PMH is significant for permanent atrial fibrillation, indwelling pacemaker, protein calorie malnutrition, hypertension, CHFrEF 25-30%, dementia.    PT Comments    Pt alert on arrival.  Pt making steady gains toward goals.  Spent time correcting sling placement, AA/AROM to All four extremities, transfer safety and progressing gait.    Follow Up Recommendations  SNF     Equipment Recommendations  Other (comment) (TBA)    Recommendations for Other Services       Precautions / Restrictions Precautions Precautions: Fall Precaution Comments: R shoulder sling Required Braces or Orthoses: Sling Restrictions RUE Weight Bearing: Weight bearing as tolerated    Mobility  Bed Mobility Overal bed mobility: Needs Assistance Bed Mobility: Supine to Sit     Supine to sit: Mod assist     General bed mobility comments: cues for sequencing/direction, pivot assist with the pad, and truncal assist up via L elbow.  pt scooted toward EOB with min guard to min  Transfers Overall transfer level: Needs assistance Equipment used: None Transfers: Sit to/from Stand Sit to Stand: Mod assist         General transfer comment: assist forward and boosted, use of IV pole once up for stability  Ambulation/Gait Ambulation/Gait assistance: Mod assist Gait Distance (Feet): 35 Feet Assistive device: IV Pole Gait Pattern/deviations: Step-through pattern;Decreased step length - right;Decreased step length - left;Decreased stride length Gait velocity: slower   General Gait Details: mildly unsteady, adducted, low amplitude steps.    Stairs             Wheelchair Mobility    Modified Rankin (Stroke Patients Only)        Balance Overall balance assessment: Needs assistance Sitting-balance support: Single extremity supported;Bilateral upper extremity supported;No upper extremity supported Sitting balance-Leahy Scale: Fair     Standing balance support: Single extremity supported;During functional activity Standing balance-Leahy Scale: Poor Standing balance comment: external assist                             Cognition Arousal/Alertness: Awake/alert Behavior During Therapy: WFL for tasks assessed/performed Overall Cognitive Status: History of cognitive impairments - at baseline                                 General Comments: dementia at baseline, pt oriented x3 (disoriented to time), was able to recall and tell OT events leading up to hospitalization; appears to be accurate historian for home setup/PLOF.       Exercises Other Exercises Other Exercises: hip/knee flexion/ext x5 reps with graded resistance  Other Exercises: bicep tricep presses bil with graded resistance x 5 reps.    General Comments        Pertinent Vitals/Pain Pain Assessment: Faces Faces Pain Scale: Hurts a little bit Pain Location: generalized Pain Descriptors / Indicators: Discomfort Pain Intervention(s): Monitored during session    Home Living                      Prior Function            PT Goals (current goals can now be found in the care plan section) Acute Rehab PT  Goals PT Goal Formulation: With patient Time For Goal Achievement: 10/30/20 Potential to Achieve Goals: Fair    Frequency    Min 2X/week      PT Plan Current plan remains appropriate    Co-evaluation              AM-PAC PT "6 Clicks" Mobility   Outcome Measure  Help needed turning from your back to your side while in a flat bed without using bedrails?: A Little Help needed moving from lying on your back to sitting on the side of a flat bed without using bedrails?: A Lot Help needed moving to  and from a bed to a chair (including a wheelchair)?: A Lot Help needed standing up from a chair using your arms (e.g., wheelchair or bedside chair)?: A Lot Help needed to walk in hospital room?: A Lot Help needed climbing 3-5 steps with a railing? : Total 6 Click Score: 12    End of Session   Activity Tolerance: Patient tolerated treatment well;Patient limited by fatigue   Nurse Communication: Mobility status PT Visit Diagnosis: Unsteadiness on feet (R26.81);Other abnormalities of gait and mobility (R26.89);Muscle weakness (generalized) (M62.81)     Time: 1561-5379 PT Time Calculation (min) (ACUTE ONLY): 24 min  Charges:  $Gait Training: 8-22 mins $Therapeutic Activity: 8-22 mins                     10/20/2020  Ginger Carne., PT Acute Rehabilitation Services [phone]  (pager) [phone]  (office)   Tessie Fass Therese Rocco 10/20/2020, 5:37 PM

## 2020-10-20 NOTE — Consult Note (Signed)
Consultation Note Date: 10/20/2020   Patient Name: [name] [name]  DOB: 1922-12-25  MRN: [id number]  Age / Sex: 86 y.o., male  PCP: Raymon Mutton., FNP Referring Physician: Billey Co, MD  Reason for Consultation: Establishing goals of care  HPI/Patient Profile: 86 y.o. male  with past medical history of a fib with permanent pacemaker, HTN, CHF with EF 25-30%, and dementia admitted on 10/15/2020 with fall and SDH. He was down for ~ 13 hours, CK elevated - has since improved. No need for surgical management of SDH per neurosurgery.  PMT consulted for GOC.  Clinical Assessment and Goals of Care: I have reviewed medical records including EPIC notes, labs and imaging, assessed the patient and then met with patient  to discuss diagnosis prognosis, GOC, EOL wishes, disposition and options.  I introduced Palliative Medicine as specialized medical care for people living with serious illness. It focuses on providing relief from the symptoms and stress of a serious illness. The goal is to improve quality of life for both the patient and the family.   Patient and I speak briefly about medical condition and baseline status. When attempting to discuss goals of care patient is slightly confused and also voices that he is uncomfortable making medical decisions as his understanding is limited. He would like me to speak with his daughter Steward Drone.   I called Steward Drone for further discussion of goals of care.   As far as functional and nutritional status, patient was living alone - used walker for ambulation. Needed some assistance with ADLs. Okay appetite - now limited by pain from fall.    We discussed patient's current illness and what it means in the larger context of patient's on-going co-morbidities. Patient and daughter with good understanding of PMH and acute conditions.   Advance directives, concepts specific to code status, artificial feeding and  hydration, and rehospitalization were considered and discussed. We discussed code status. Encouraged patient/family to consider DNR/DNI status understanding evidenced based poor outcomes in similar hospitalized patients, as the cause of the arrest is likely associated with chronic/terminal disease rather than a reversible acute cardio-pulmonary event. Daughter agrees DNR is appropriate. We discussed a MOST form and it was completed however we could not sign MOST as online [facility]YNCA would not connect to daughter's phone and she could not be present in hospital as hospital causes panic attacks. We discussed plan to follow up with palliative outpatient and can complete MOST form at that time.  MOST for selections were as follows: DNR, limited additional interventions (tranfer to hospital if indicated), determine use or limitation of antibiotics when infections occurs, IV fluids for a defined trial period, and feeding tube for a defined trial period.   Discussed with patient/family the importance of continued conversation with family and the medical providers regarding overall plan of care and treatment options, ensuring decisions are within the context of the patient's values and GOCs.    Patient and family both agreeable to SNF placement - will ask outpatient palliative to follow up.  Questions and concerns were addressed. The family was encouraged to call with questions or concerns.   Primary Decision Maker NEXT OF KIN - daughter Steward Drone, patient with mild cognitive impairment but can certainly participate in decision making   SUMMARY OF RECOMMENDATIONS   - code status changed to DNR - MOST discussed and decided on however daughter unavailable to sign in person and unable to over phone - will ask outpatient palliative to see at SNF and hopeful  can complete MOST - selections detailed above  Code Status/Advance Care Planning:  DNR   Prognosis:   Unable to determine  Discharge Planning: [place] for rehab with Palliative care service follow-up      Primary Diagnoses: Present on Admission: . Protein-calorie malnutrition, severe ([place]) . Atrial fibrillation-permanent . AAA (abdominal aortic aneurysm) without rupture ([place]) . Essential hypertension . CKD (chronic kidney disease), stage III ([place]) . Balance problem . Pacemaker-St.Jude . Superior subluxation of humeral head, right   I have reviewed the medical record, interviewed the patient and family, and examined the patient. The following aspects are pertinent.  Past Medical History:  Diagnosis Date  . AAA (abdominal aortic aneurysm) without rupture ([place])    10/2012 CT scan: 4.3x4.6cm  . Arthritis    "legs are stiff" , had spinal injections for pain relieve 3-4 yrs. ago  . Atrial fibrillation-permanent   . Bradycardia   . Cataract   . CHF (congestive heart failure) ([place])   . CKD (chronic kidney disease), stage III ([place]) 10/16/2020  . Constipation   . Depression   . Diverticulosis   . GERD (gastroesophageal reflux disease)   . Hypertension   . Impacted cerumen of left ear 07/28/2019  . Knee problem 04/30/2014  . Nonischemic/ ischemic cardiomyopathy    Single-vessel coronary disease catheterization 2004-Myoview showing inferior wall perfusion defect Myoview 2006  . Pacemaker-St.Jude 08/26/2012  . Recurrent upper respiratory infection (URI)    recent cold- treating /w OTC  . Subdural hematoma ([facility])    Prior evacuation   Social History   Socioeconomic History  . Marital status: Widowed    Spouse name: Not on file  . Number of children: Not on file  . Years of education: Not on file  . Highest education level: Not on file  Occupational History  . Not on file  Tobacco Use  . Smoking status: Former Smoker    Packs/day: 0.25    Years: 15.00    Pack years: 3.75    Quit date: 12/24/1994    Years since quitting: 25.8  . Smokeless tobacco: Never Used  Vaping Use  . Vaping Use: Never used  Substance  and Sexual Activity  . Alcohol use: No  . Drug use: No  . Sexual activity: Not on file  Other Topics Concern  . Not on file  Social History Narrative  . Not on file   Social Determinants of Health   Financial Resource Strain:   . Difficulty of Paying Living Expenses: Not on file  Food Insecurity:   . Worried About Charity fundraiser in the Last Year: Not on file  . Ran Out of Food in the Last Year: Not on file  Transportation Needs:   . Lack of Transportation (Medical): Not on file  . Lack of Transportation (Non-Medical): Not on file  Physical Activity:   . Days of Exercise per Week: Not on file  . Minutes of Exercise per Session: Not on file  Stress:   . Feeling of Stress : Not on file  Social Connections:   . Frequency of Communication with Friends and Family: Not on file  . Frequency of Social Gatherings with Friends and Family: Not on file  . Attends Religious Services: Not on file  . Active Member of Clubs or Organizations: Not on file  . Attends Archivist Meetings: Not on file  . Marital Status: Not on file   Family History  Problem Relation Age of Onset  .  Anesthesia problems Neg Hx   . Hypotension Neg Hx   . Malignant hyperthermia Neg Hx   . Pseudochol deficiency Neg Hx    Scheduled Meds: . cetaphil   Topical Daily  . feeding supplement  237 mL Oral TID BM  . mirtazapine  30 mg Oral QHS  . polyethylene glycol  17 g Oral Daily   Continuous Infusions: . sodium chloride 50 mL/hr at 10/20/20 0923   PRN Meds:.acetaminophen **OR** acetaminophen, benzocaine Allergies  Allergen Reactions  . Other Other (See Comments)    The patient has seasonal and/or environmental allergies- Runny nose   Review of Systems  Constitutional: Positive for appetite change and fatigue.  Respiratory: Negative for shortness of breath.   Musculoskeletal:       Shoulder pain    Physical Exam Constitutional:      General: He is not in acute distress. Pulmonary:      Effort: Pulmonary effort is normal. No respiratory distress.  Skin:    General: Skin is warm and dry.  Neurological:     Mental Status: He is alert and oriented to person, place, and time.     Comments: Mild confusion     Vital Signs: BP 121/62 (BP Location: Left Arm)   Pulse 69   Temp 98.2 F (36.8 C) (Oral)   Resp (!) 23   Ht $R'6\' 1"'Ll$  (1.854 m)   Wt 66.6 kg   SpO2 94%   BMI 19.37 kg/m  Pain Scale: 0-10   Pain Score: 0-No pain   SpO2: SpO2: 94 % O2 Device:SpO2: 94 % O2 Flow Rate: .   IO: Intake/output summary:   Intake/Output Summary (Last 24 hours) at 10/20/2020 1533 Last data filed at 10/20/2020 0544 Gross per 24 hour  Intake 900 ml  Output 950 ml  Net -50 ml    LBM: Last BM Date: 10/17/20 Baseline Weight: Weight: 64 kg Most recent weight: Weight: 66.6 kg     Palliative Assessment/Data: PPS 50%    Time Total: 55 minutes Greater than 50%  of this time was spent counseling and coordinating care related to the above assessment and plan.  Juel Burrow, DNP, AGNP-C Palliative Medicine Team [phone] Pager: [phone]

## 2020-10-21 DIAGNOSIS — S065X9A Traumatic subdural hemorrhage with loss of consciousness of unspecified duration, initial encounter: Secondary | ICD-10-CM | POA: Diagnosis not present

## 2020-10-21 LAB — CBC WITH DIFFERENTIAL/PLATELET
Abs Immature Granulocytes: 0.01 10*3/uL (ref 0.00–0.07)
Basophils Absolute: 0 10*3/uL (ref 0.0–0.1)
Basophils Relative: 1 %
Eosinophils Absolute: 0.2 10*3/uL (ref 0.0–0.5)
Eosinophils Relative: 5 %
HCT: 31.3 % — ABNORMAL LOW (ref 39.0–52.0)
Hemoglobin: 10.4 g/dL — ABNORMAL LOW (ref 13.0–17.0)
Immature Granulocytes: 0 %
Lymphocytes Relative: 35 %
Lymphs Abs: 1.3 10*3/uL (ref 0.7–4.0)
MCH: 24.5 pg — ABNORMAL LOW (ref 26.0–34.0)
MCHC: 33.2 g/dL (ref 30.0–36.0)
MCV: 73.6 fL — ABNORMAL LOW (ref 80.0–100.0)
Monocytes Absolute: 0.4 10*3/uL (ref 0.1–1.0)
Monocytes Relative: 11 %
Neutro Abs: 1.9 10*3/uL (ref 1.7–7.7)
Neutrophils Relative %: 48 %
Platelets: 217 10*3/uL (ref 150–400)
RBC: 4.25 MIL/uL (ref 4.22–5.81)
RDW: 17.2 % — ABNORMAL HIGH (ref 11.5–15.5)
WBC: 3.9 10*3/uL — ABNORMAL LOW (ref 4.0–10.5)
nRBC: 0 % (ref 0.0–0.2)

## 2020-10-21 LAB — BASIC METABOLIC PANEL
Anion gap: 7 (ref 5–15)
BUN: 17 mg/dL (ref 8–23)
CO2: 20 mmol/L — ABNORMAL LOW (ref 22–32)
Calcium: 8.4 mg/dL — ABNORMAL LOW (ref 8.9–10.3)
Chloride: 113 mmol/L — ABNORMAL HIGH (ref 98–111)
Creatinine, Ser: 1.21 mg/dL (ref 0.61–1.24)
GFR, Estimated: 55 mL/min — ABNORMAL LOW (ref >60)
Glucose, Bld: 101 mg/dL — ABNORMAL HIGH (ref 70–99)
Potassium: 4.6 mmol/L (ref 3.5–5.1)
Sodium: 140 mmol/L (ref 135–145)

## 2020-10-21 NOTE — TOC Progression Note (Signed)
Transition of Care [facility]) - Progression Note    Patient Details  Name: [name] [name] MRN: [id number] Date of Birth: 1923/09/19  Transition of Care [facility]) CM/SW [place], [place] Phone Number: 10/21/2020, 3:14 PM  Clinical Narrative:     CSW spoke with [facility] has requested more information- authorization remains pending.   Called patient's daughter to provide update- no answer   Thurmond Butts, MSW, [place] Clinical Social Worker   Expected Discharge Plan: Skilled Nursing Facility Barriers to Discharge: Continued Medical Work up  Expected Discharge Plan and Services Expected Discharge Plan: [place] In-house Referral: Clinical Social Work     Living arrangements for the past 2 months: Single Family Home                                       Social Determinants of Health (SDOH) Interventions    Readmission Risk Interventions No flowsheet data found.

## 2020-10-21 NOTE — TOC Progression Note (Addendum)
Transition of Care [facility]) - Progression Note    Patient Details  Name: [name] MRN: [id number] Date of Birth: May 19, 1923  Transition of Care [facility]) CM/SW [place], [place] Phone Number: 10/21/2020, 11:20 AM  Clinical Narrative:      1:40pm- CSW informed TOC supervisor/Jesse of barrier with getting authorization- TOC Supervisor will follow up.  11:20am-CSW called [employer] [facility]/Tina- no answer- [facility] AD- she will follow up and see what is the status - CSW advised the patient is medically stable -CSW waiting on return call.  CSW unable to confirm status of insurance authorization status with SNF- CSW spoke with the patient's daughter,Brenda-updated on SNF placement. Patient's daughter requested CSW to send referral to [facility] - as well.     Expected Discharge Plan: [place] Barriers to Discharge: Continued Medical Work up  Expected Discharge Plan and Services Expected Discharge Plan: [place] In-house Referral: Clinical Social Work     Living arrangements for the past 2 months: Single Family Home                                       Social Determinants of Health (SDOH) Interventions    Readmission Risk Interventions No flowsheet data found.

## 2020-10-21 NOTE — Progress Notes (Addendum)
Family Medicine Teaching Service Daily Progress Note Intern Pager: [phone]  Patient name: [name] [name] Medical record number: [id number] Date of birth: 1923/02/08 Age: 86 y.o. Gender: male  Primary Care Provider: Sonia Side., FNP Consultants: Neurosurgery, palliative care Code Status: DNR  Pt Overview and Major Events to Date:  Admitted 10/23  Assessment and Plan: [name] [name] is a 86 y.o. male presenting with elevated CK and subdural hematoma after being found down after fall while home alone. PMH is significant for permanent atrial fibrillation, indwelling pacemaker, protein calorie malnutrition, hypertension, CHFrEF 25-30%, dementia. Medically stable for discharge.   Goals of care PT/OT recommended wheelchair and SNF. DME order placed. Both patient and daughter are agreeable to SNF and palliative consult to better determine goals of care.  -Appreciate SW assistance, patient received bed placement at [facility], currently awaiting insurance authorization -Palliative care consulted, code status changed to DNR. Patient to have outpatient f/u with palliative medicine   Hypotension Episodes of hypotension noted since IV NS fluids discontinued on 10/27. Discontinued home coreg.Standing BP 90/47. Orthostatics: lying 121/71, sitting 111/68 and 115/68. Current BP118/64 when I examined pt this morning.  -discontinued IV fluids  -continue to hold antihypertensives -monitor BP  Subdural Hematoma, acute  AMS, resolved  Hx of Dementia Repeat CT head demonstrated stable low-attenuation subdural collection measuring up to 37mm in thickness along the left cerebral convexity without significant internal expansion or new hyperdense hemorrhage. No acute intracranial abnormality noted. Recommendation of nonemergent outpatient CT or MRA as clinically warranted. CThead 10/25noted no acute changes. - Neurosurgerysigned off -Maintainelevated head of bed  Shoulder Pain Likely  resolved, denies any current shoulder pain.Seen by OT on 10/26, no evidence of fracture or dislocation. - Tylenol and kpad for pain  -begin gradual weight bearing with sling use for comfort to prevent frozen shoulder, discussed with patient. He agrees and understands.  - refer for outpatient orthopedics follow up   CHFrEF 25-30%, stable  Last echo in 2013 with EF estimate of 25-30%. Home medication includes Coreg 3.125 BID. XR chest consistent with cardiomegaly, patient has pacemaker in place.Most recent weight 67.5kg yesterday (not measured today) fromadmissionat 64 kg. - daily weights  - monitor fluid status   AAA Stable.Most recent aortic US demonstrated 5 cm distal abdominal aneurysm noted, recommended follow up with CT/MRI every 6 months along with vascular consultation.Ultrasound in June of 2020 showed aortic measurement is 5.3 cmnoting that the largest aortic diameter remains essentially unchanged compared to prior exam.  - monitor BP with vitals -F/u every 6 months for CT/MRI -F/u vascular outpatient  HTN  BP initially hypertensive up to 174/146on admission, most recently blood pressure118/64. Home meds include Coreg 3.125 BID and ramipril. -holdhome coregdue to hypotension - hold ramipril with small Cr elevation in setting of elevated CK  - monitor BP  Protein Calorie Malnutrition Patient drinking home boosts and on mirtazapine 30mg  qHS.  - continue mirtazapine 30mg  -continue ensure supplements -nutrition consult, appreciate recs and involvement -continue to encourage adequate PO intake -orajel for gum pain   Chronic Constipation  Patient with history of constipation. Reports that at home he has a BM every 3-4 days but when he takes milk of magnesium he has  BM daily.   -scheduled miralax   Atrial Fibrillation Chronic and stable. HR 63today.EKG on admission consistent with atrial fibrillation. Patient has indwelling pacemaker. Patient  on no anticoagulation currently. Home medication includes plavix 75mg  daily.  - hold plavix in setting of subdural hematoma, hold on discharge -  cardiac monitoring   FEN/GI: heart healthy  PPx: SCDs  Disposition:awaiting insurance authorization for SNF   Subjective:  No acute events overnight. Patient resting in bed when I examined him. Denies headache, dizziness, chest pain, SOB. Denies any complaints or concerns this morning.   Objective: Temp:  [98.2 F (36.8 C)-99.7 F (37.6 C)] 98.3 F (36.8 C) (10/30 1114) Pulse Rate:  [63-79] 66 (10/30 1130) Resp:  [15-26] 18 (10/30 1130) BP: (99-112)/(46-64) 99/61 (10/30 1114) SpO2:  [98 %-100 %] 98 % (10/30 1130) Physical Exam: General: Pleasant, elderly male resting comfortably in bed, NAD Cardiovascular: RRR no murmurs Respiratory: CTAB. Normal WOB Abdomen: soft, non distended, non tender. Normoactive BS  Extremities: warm, dry. Well perfused. Distal pulses 2+ bilaterally   Laboratory: Recent Labs  Lab 10/20/20 0102 10/21/20 0332 10/22/20 0507  WBC 3.6* 3.9* 4.4  HGB 10.6* 10.4* 11.1*  HCT 32.4* 31.3* 34.3*  PLT 206 217 188   Recent Labs  Lab 10/15/20 1448 10/16/20 0349 10/20/20 0102 10/21/20 0332 10/22/20 0507  NA 136   < > 141 140 139  K 4.6   < > 4.9 4.6 5.1  CL 107   < > 116* 113* 111  CO2 19*   < > 17* 20* 19*  BUN 21   < > 16 17 17   CREATININE 1.14   < > 1.08 1.21 1.25*  CALCIUM 8.5*   < > 8.4* 8.4* 8.5*  PROT 6.5  --   --   --   --   BILITOT 1.3*  --   --   --   --   ALKPHOS 48  --   --   --   --   ALT 13  --   --   --   --   AST 28  --   --   --   --   GLUCOSE 109*   < > 89 101* 73   < > = values in this interval not displayed.    Imaging/Diagnostic Tests: No new imaging  [name] Dice, DO 10/22/2020, 1:10 PM PGY-1, [facility] Intern pager: [phone], text pages welcome

## 2020-10-21 NOTE — Progress Notes (Signed)
Family Medicine Teaching Service Daily Progress Note Intern Pager: [phone]  Patient name: [name] [name] Medical record number: [id number] Date of birth: [birth year] Age: 86 y.o. Gender: male  Primary Care Provider: Sonia Side., FNP Consultants: Neurosurgery, palliative care  Code Status: DNR  Pt Overview and Major Events to Date:  Admitted 10/23  Assessment and Plan: [name] [name] is a 86 y.o. male presenting with elevated CK and subdural hematoma after being found down after fall while home alone. PMH is significant for permanent atrial fibrillation, indwelling pacemaker, protein calorie malnutrition, hypertension, CHFrEF 25-30%, dementia.   Goals of care PT/OT recommended wheelchair and SNF. DME order placed. Both patient and daughter are agreeable to SNF and palliative consult to better determine goals of care.  -Appreciate SW assistance, patient received bed placement at [facility], currently awaiting insurance authorization -Palliative care consulted, code status changed to DNR. Patient to have outpatient f/u with palliative medicine   Hypotension Episodes of hypotension noted since IV NS fluids discontinued on 10/27. Discontinued home coreg. Standing BP 90/47.  Orthostatics: lying 121/71, sitting 111/68 and 115/68.  Current BP 115/62.  -discontinued IV fluids  -continue to hold antihypertensives -monitor BP  Subdural Hematoma, acute  AMS, resolved  Hx of Dementia AOx4, denies any other generalized or localized pain. Palpation of left parietal regions elicitsnotenderness, endorses only mild soreness in the area.Repeat CT head demonstrated stable low-attenuation subdural collection measuring up to 32mm in thickness along the left cerebral convexity without significant internal expansion or new hyperdense hemorrhage. No acute intracranial abnormality noted. Recommendation of nonemergent outpatient CT or MRA as clinically warranted. CThead 10/25noted no acute  changes. - Neurosurgerysigned off -Maintainelevated head of bed  Shoulder Pain Likely resolved, denies any current shoulder pain.Seen by OT on 10/26, no evidence of fracture or dislocation.  - Tylenol and kpad for pain  -begin gradual weight bearing with sling use for comfort to prevent frozen shoulder, discussed with patient. He agrees and understands.  - refer for outpatient orthopedics follow up   Rhabdomyolysis after fall and prolonged time down AKI Resolved. Most recent creatinine 1.21, resolved. Most recent CK350 wnl, significantlyimprovedfrom 753. -continue to monitor   CHFrEF 25-30%, stable  Last echo in 2013 with EF estimate of 25-30%. Home medication includes Coreg 3.125 BID. XR chest consistent with cardiomegaly, patient has pacemaker in place.Most recent weight 66.6kg  fromadmission at 64 kg. - daily weights  - monitor fluid status   AAA Stable. Most recent aortic US demonstrated 5 cm distal abdominal aneurysm noted, recommended follow up with CT/MRI every 6 months along with vascular consultation.Ultrasound in June of 2020 showed aortic measurement is 5.3 cmnoting that the largest aortic diameter remains essentially unchanged compared to prior exam.  - monitor BP with vitals -F/u every 6 months for CT/MRI -F/u vascular outpatient  HTN  BP initially hypertensive up to 174/146on admission, most recently blood pressure115/62. Home meds include Coreg 3.125 BID and ramipril. -holdhome coregdue to hypotension - hold ramipril with small Cr elevation in setting of elevated CK  - monitor BP  Protein Calorie Malnutrition Per patient, he is eating and drinking appropriately.Reports that when he fell, he hit his mouth causing a cut in his gums that makes it hurt to chew certain foods. States that he has been eating well thus far. Patient drinking home boosts and on mirtazapine 30mg  qHS.  - continue mirtazapine 30mg  -continue ensure  supplements -nutrition consult, appreciate recs and involvement -continue to encourage adequate PO intake  -orajel for gum  pain   Microcytic anemia  Stable Hgb.Patient admitted with hemoglobin of 10.8, today Hgb 10.4and MCV low at 73.6. Iron low at 22, TIBC low at 186 and saturation ratio low at 12. U IBC normal at 164. Ferritin within normal range at 318. Values consistent with anemia of chronic disease, possibly due to reactive causes. Malnutrition may also be contributing. -monitor Hgb  Chronic Constipation  Patient with history of constipation. Per patient, last BM was 2 days ago. Does not endorse any abdominal pain. Reports that at home he has a BM every 3-4 days but when he takes milk of magnesium he has  BM daily.  -scheduled miralax   Atrial Fibrillation Chronic and stable. HR 63today.EKG on admission consistent with atrial fibrillation. Patient has indwelling pacemaker. Patient on no anticoagulation currently. Home medication includes plavix 75mg  daily.  - hold plavix in setting of subdural hematoma, hold on discharge - cardiac monitoring   FEN/GI: heart healthy  PPx: SCDs   Status is: Inpatient  Disposition: awaiting insurance authorization for SNF         Subjective:  No acute overnight events reported. Patient denies any concerns today. Denies chest pain, dyspnea, dizziness, vision changes and pain elsewhere. States that he has been more active since the past few days, gets up and sits in the chair and has walked from the bed to the entry and back without difficulty. He admits to occasionally getting weak when he gets up but quickly resolves.   Objective: Temp:  [98.2 F (36.8 C)-99.8 F (37.7 C)] 98.3 F (36.8 C) (10/29 0358) Pulse Rate:  [62-69] 63 (10/29 0358) Resp:  [17-23] 21 (10/29 0358) BP: (109-121)/(59-62) 115/62 (10/29 0358) SpO2:  [99 %-100 %] 99 % (10/29 0358) Weight:  [67.5 kg] 67.5 kg (10/29 0518) Physical Exam: General:  Patient sleeping comfortably in bed, in no acute distress. Cardiovascular: RRR, no murmurs or gallops auscultated  Respiratory: lungs clear to auscultation bilaterally, good air movement throughout all lung fields  Abdomen: soft, nontender, presence of active bowel sounds Extremities: dorsalis pedis pulses strong and equal bilaterally, no LE edema noted bilaterally, right UE placed in sling  Neuro: AOx4, no facial asymmetry noted, conversationally appropriate  Psych: mood appropriate   Laboratory: Recent Labs  Lab 10/19/20 0146 10/20/20 0102 10/21/20 0332  WBC 4.4 3.6* 3.9*  HGB 11.3* 10.6* 10.4*  HCT 35.8* 32.4* 31.3*  PLT 203 206 217   Recent Labs  Lab 10/15/20 1448 10/16/20 0349 10/19/20 0146 10/20/20 0102 10/21/20 0332  NA 136   < > 139 141 140  K 4.6   < > 5.3* 4.9 4.6  CL 107   < > 113* 116* 113*  CO2 19*   < > 19* 17* 20*  BUN 21   < > 17 16 17   CREATININE 1.14   < > 1.17 1.08 1.21  CALCIUM 8.5*   < > 8.2* 8.4* 8.4*  PROT 6.5  --   --   --   --   BILITOT 1.3*  --   --   --   --   ALKPHOS 48  --   --   --   --   ALT 13  --   --   --   --   AST 28  --   --   --   --   GLUCOSE 109*   < > 90 89 101*   < > = values in this interval not displayed.  Imaging/Diagnostic Tests: No results found.   Donney Dice, DO 10/21/2020, 6:16 AM PGY-1, [place] Intern pager: [phone], text pages welcome

## 2020-10-22 DIAGNOSIS — S065X9A Traumatic subdural hemorrhage with loss of consciousness of unspecified duration, initial encounter: Secondary | ICD-10-CM | POA: Diagnosis not present

## 2020-10-22 LAB — CBC WITH DIFFERENTIAL/PLATELET
Abs Immature Granulocytes: 0.02 10*3/uL (ref 0.00–0.07)
Basophils Absolute: 0 10*3/uL (ref 0.0–0.1)
Basophils Relative: 1 %
Eosinophils Absolute: 0.3 10*3/uL (ref 0.0–0.5)
Eosinophils Relative: 6 %
HCT: 34.3 % — ABNORMAL LOW (ref 39.0–52.0)
Hemoglobin: 11.1 g/dL — ABNORMAL LOW (ref 13.0–17.0)
Immature Granulocytes: 1 %
Lymphocytes Relative: 41 %
Lymphs Abs: 1.8 10*3/uL (ref 0.7–4.0)
MCH: 24.1 pg — ABNORMAL LOW (ref 26.0–34.0)
MCHC: 32.4 g/dL (ref 30.0–36.0)
MCV: 74.4 fL — ABNORMAL LOW (ref 80.0–100.0)
Monocytes Absolute: 0.4 10*3/uL (ref 0.1–1.0)
Monocytes Relative: 10 %
Neutro Abs: 1.9 10*3/uL (ref 1.7–7.7)
Neutrophils Relative %: 41 %
Platelets: 188 10*3/uL (ref 150–400)
RBC: 4.61 MIL/uL (ref 4.22–5.81)
RDW: 17.7 % — ABNORMAL HIGH (ref 11.5–15.5)
WBC: 4.4 10*3/uL (ref 4.0–10.5)
nRBC: 0 % (ref 0.0–0.2)

## 2020-10-22 LAB — BASIC METABOLIC PANEL
Anion gap: 9 (ref 5–15)
BUN: 17 mg/dL (ref 8–23)
CO2: 19 mmol/L — ABNORMAL LOW (ref 22–32)
Calcium: 8.5 mg/dL — ABNORMAL LOW (ref 8.9–10.3)
Chloride: 111 mmol/L (ref 98–111)
Creatinine, Ser: 1.25 mg/dL — ABNORMAL HIGH (ref 0.61–1.24)
GFR, Estimated: 53 mL/min — ABNORMAL LOW (ref >60)
Glucose, Bld: 73 mg/dL (ref 70–99)
Potassium: 5.1 mmol/L (ref 3.5–5.1)
Sodium: 139 mmol/L (ref 135–145)

## 2020-10-22 NOTE — Plan of Care (Signed)

## 2020-10-22 NOTE — Progress Notes (Signed)
Family Medicine Teaching Service Daily Progress Note Intern Pager: [phone]  Patient name: [name] [name] Medical record number: [id number] Date of birth: 1923/06/23 Age: 86 y.o. Gender: male  Primary Care Provider: Sonia Side., FNP Consultants: Neurosurgery, palliative Code Status: DNR  Pt Overview and Major Events to Date:  Admitted 10/23 for subdural hematoma - CT head with 12mm low-density subdural collection along left cerebral convexity, mild mass effect 10/24: Aortic US: 5 cm distal abdominal aortic aneurysm is noted 10/25: CT head: stable subdural hematoma, Unchanged 3 mm of rightward midline shift  Assessment and Plan: [name] [name] is a 86 y.o. male presenting with elevated CK and subdural hematoma after being found down after fall while home alone. PMH is significant for permanent atrial fibrillation, indwelling pacemaker, protein calorie malnutrition, hypertension, CHFrEF 25-30%, dementia. Medically stable for discharge.   Goals of care Patient awaiting SNF with outpatient palliative for continued GOC discussion. -Appreciate SW assistance, patient received bed placement at [facility], currently awaiting insurance authorization -Palliative care consulted, code status changed to DNR. Patient to have outpatient f/u with palliative medicine  - PT/OT recommended wheelchair and SNF. DME order placed.  Subdural Hematoma, acute  AMS, resolved  Hx of Dementia No acute evens overnight. Repeat CT notable for stable subdural collection.  - Neurosurgerysigned off -Maintainelevated head of bed - nonemergent outpatient CT or MRA   Hypotension (resolved)  H/o Hypertension: BP remains soft bust stable this AM at 102/65. Home meds include Coreg 3.125 BID and ramipril. -continue to hold antihypertensives, re-dd if indicated  -monitor BP - consider small bolus if BP continues to decline  Shoulder Pain, resolved Seen by OT on 10/26, no evidence of fracture or  dislocation. - Tylenol and kpad for pain  -begin gradual weight bearing with sling use for comfort to prevent frozen shoulder, discussed with patient. He agrees and understands.  - refer for outpatient orthopedics follow up   CHFrEF 25-30%, stable  Last echo in 2013 with EF estimate of 25-30%. Home medication includes Coreg 3.125 BID. XR chest consistent with cardiomegaly, patient has pacemaker in place. - daily weights  - monitor fluid status   AAA Stable.Most recent aortic US demonstrated 5 cm distal abdominal aneurysm noted, recommended follow up with CT/MRI every 6 months along with vascular consultation.Ultrasound in June of 2020 showed aortic measurement is 5.3 cmnoting that the largest aortic diameter remains essentially unchanged compared to prior exam.  - monitor BP with vitals -F/u every 6 months for CT/MRI -F/u vascular outpatient  Protein Calorie Malnutrition Patient drinking home boosts and on mirtazapine 30mg  qHS.  - continue mirtazapine 30mg  -continue ensure supplements -nutrition consult, appreciate recs and involvement -continue to encourage adequate PO intake -orajel for gum pain   Chronic Constipation  Patient with history of constipation. Reports that at home he has a BM every 3-4 days but when he takes milk of magnesium he has  BM daily.   -scheduled miralax   Atrial Fibrillation Chronic and stable. HR 63today.EKG on admission consistent with atrial fibrillation. Patient has indwelling pacemaker. Patient on no anticoagulation currently. Home medication includes plavix 75mg  daily.  - hold plavix in setting of subdural hematoma, hold on discharge - cardiac monitoring  FEN/GI: heart healthy  PPx: SCDs  Disposition:awaiting insurance authorization for SNF   Subjective:  No acute events overnight. Patient denies any pain.   Objective: Temp:  [98.3 F (36.8 C)-99 F (37.2 C)] 99 F (37.2 C) (10/31 0400) Pulse Rate:  [63-79] 65  (10/31 0400)  Resp:  [12-28] 12 (10/31 0400) BP: (97-112)/(46-78) 102/65 (10/31 0400) SpO2:  [96 %-100 %] 99 % (10/31 0400) Physical Exam: General: sleepy elderly male, resting comfortably in bed, thin, well in no acute distress with non-toxic appearance CV: regular rate and rhythm without murmurs, rubs, or gallops Lungs: clear to auscultation bilaterally with normal work of breathing on room air Skin: warm, dry Neuro:  opens eyes to voice, answers questions appropriately  Laboratory: Recent Labs  Lab 10/20/20 0102 10/21/20 0332 10/22/20 0507  WBC 3.6* 3.9* 4.4  HGB 10.6* 10.4* 11.1*  HCT 32.4* 31.3* 34.3*  PLT 206 217 188   Recent Labs  Lab 10/20/20 0102 10/21/20 0332 10/22/20 0507  NA 141 140 139  K 4.9 4.6 5.1  CL 116* 113* 111  CO2 17* 20* 19*  BUN 16 17 17   CREATININE 1.08 1.21 1.25*  CALCIUM 8.4* 8.4* 8.5*  GLUCOSE 89 101* 73    Imaging/Diagnostic Tests: No results found.  Mina Marble [place], DO 10/23/2020, 6:14 AM PGY-3, [place] Intern pager: [phone], text pages welcome

## 2020-10-23 DIAGNOSIS — S065X9A Traumatic subdural hemorrhage with loss of consciousness of unspecified duration, initial encounter: Secondary | ICD-10-CM | POA: Diagnosis not present

## 2020-10-23 NOTE — TOC Progression Note (Signed)
Transition of Care [facility]) - Progression Note    Patient Details  Name: [name] [name] MRN: [id number] Date of Birth: 13-Jan-1923  Transition of Care [facility] CM/SW Contact  Vergie Living, LCSW Phone Number: 10/23/2020, 1:40 PM  Clinical Narrative:   CSW called [place] to follow up for Pt discharge.  According to nurse on duty they do not accept admissions on weekends.   Expected Discharge Plan: [place] Barriers to Discharge: Continued Medical Work up  Expected Discharge Plan and Services Expected Discharge Plan: [place] In-house Referral: Clinical Social Work     Living arrangements for the past 2 months: Single Family Home                                       Social Determinants of Health (SDOH) Interventions    Readmission Risk Interventions No flowsheet data found.

## 2020-10-24 DIAGNOSIS — S065X9A Traumatic subdural hemorrhage with loss of consciousness of unspecified duration, initial encounter: Secondary | ICD-10-CM | POA: Diagnosis not present

## 2020-10-24 LAB — BASIC METABOLIC PANEL
Anion gap: 7 (ref 5–15)
BUN: 17 mg/dL (ref 8–23)
CO2: 22 mmol/L (ref 22–32)
Calcium: 8.5 mg/dL — ABNORMAL LOW (ref 8.9–10.3)
Chloride: 108 mmol/L (ref 98–111)
Creatinine, Ser: 1.16 mg/dL (ref 0.61–1.24)
GFR, Estimated: 58 mL/min — ABNORMAL LOW (ref >60)
Glucose, Bld: 72 mg/dL (ref 70–99)
Potassium: 4.7 mmol/L (ref 3.5–5.1)
Sodium: 137 mmol/L (ref 135–145)

## 2020-10-24 NOTE — Plan of Care (Signed)

## 2020-10-24 NOTE — TOC Progression Note (Signed)
Transition of Care [facility]) - Progression Note    Patient Details  Name: Malic Rosten MRN: [id number] Date of Birth: 1923-10-18  Transition of Care [facility]) CM/SW [place], [place] Phone Number: 10/24/2020, 11:30 AM  Clinical Narrative:     [facility] Small- to follow up on the status of insurance auth- waiting on response.    Thurmond Butts, MSW, [place] Clinical Social Worker   Expected Discharge Plan: Skilled Nursing Facility Barriers to Discharge: Continued Medical Work up  Expected Discharge Plan and Services Expected Discharge Plan: [place] In-house Referral: Clinical Social Work     Living arrangements for the past 2 months: Single Family Home                                       Social Determinants of Health (SDOH) Interventions    Readmission Risk Interventions No flowsheet data found.

## 2020-10-24 NOTE — Progress Notes (Signed)
Nutrition Follow-up  DOCUMENTATION CODES:   Severe malnutrition in context of chronic illness, Underweight  INTERVENTION:  Continue Ensure Enlive po TID, each supplement provides 350 kcal and 20 grams of protein.  Encourage adequate PO intake.   NUTRITION DIAGNOSIS:   Severe Malnutrition related to chronic illness (CHF) as evidenced by estimated needs; ongoing  GOAL:   Patient will meet greater than or equal to 90% of their needs; progressing  MONITOR:   PO intake, Supplement acceptance, Skin, Weight trends, Labs, I & O's  REASON FOR ASSESSMENT:   Consult Assessment of nutrition requirement/status  ASSESSMENT:   86 y.o. male presenting with elevated CK and subdural hematoma after being found down after fall while home alone. PMH is significant for permanent atrial fibrillation, indwelling pacemaker, protein calorie malnutrition, hypertension, CHFrEF 25-30%, dementia. Pt with rhabdomyolysis after fall.  Pt is currently on a dysphagia 2 diet with thin liquids. Meal completion has been varied from 20-70%. Pt reports appetite is fine currently with no difficulties. Pt currently has Ensure ordered and has been consuming them. RD to continue with Ensure supplementation to aid in caloric and protein needs. Awaiting SNF placement.  NUTRITION - FOCUSED PHYSICAL EXAM:    Most Recent Value  Orbital Region Severe depletion  Upper Arm Region Severe depletion  Thoracic and Lumbar Region Severe depletion  Buccal Region Severe depletion  Temple Region Severe depletion  Clavicle Bone Region Severe depletion  Clavicle and Acromion Bone Region Severe depletion  Scapular Bone Region Unable to assess  Dorsal Hand Severe depletion  Patellar Region Severe depletion  Anterior Thigh Region Severe depletion  Posterior Calf Region Severe depletion  Edema (RD Assessment) Mild  Hair Reviewed  Eyes Reviewed  Mouth Reviewed  Skin Reviewed  Nails Reviewed     Labs and medications reviewed.    Diet Order:   Diet Order            DIET DYS 2 Room service appropriate? Yes; Fluid consistency: Thin  Diet effective now                 EDUCATION NEEDS:   Not appropriate for education at this time  Skin:  Skin Assessment: Reviewed RN Assessment  Last BM:  10/28  Height:   Ht Readings from Last 1 Encounters:  10/15/20 6\' 1"  (1.854 m)    Weight:   Wt Readings from Last 1 Encounters:  10/21/20 67.5 kg   BMI:  Body mass index is 19.63 kg/m.  Estimated Nutritional Needs:   Kcal:  1650-1850  Protein:  80-90 grams  Fluid:  >/= 1.7 L/day  Corrin Parker, MS, RD, LDN RD pager number/after hours weekend pager number on Amion.

## 2020-10-24 NOTE — Progress Notes (Signed)
Physical Therapy Treatment Patient Details Name: [name] [name] MRN: [id number] DOB: 1923/08/14 Today's Date: 10/24/2020    History of Present Illness 86 y.o. male presenting with elevated CK and subdural hematoma after being found down after fall while home alone. PMH is significant for permanent atrial fibrillation, indwelling pacemaker, protein calorie malnutrition, hypertension, CHFrEF 25-30%, dementia.    PT Comments    Pt with improved ambulation tolerance despite lethargy. Pt very HOH limiting comprehension of task asked however pt alert and oriented. Pt requiring modA for walker management and 3 standing rest breaks but was able to amb 6' with RW. Pt denies any R UE pain at this time. Sling no longer needed and pt able to use RW without pain. Acute PT to cont to follow.   Follow Up Recommendations  SNF     Equipment Recommendations       Recommendations for Other Services       Precautions / Restrictions Precautions Precautions: Fall Precaution Comments: R shoulder sling Required Braces or Orthoses: Sling Restrictions Weight Bearing Restrictions: Yes RUE Weight Bearing: Weight bearing as tolerated Other Position/Activity Restrictions: pt with report of no R shoulder pain    Mobility  Bed Mobility               General bed mobility comments: pt up in chair upon PT arrival  Transfers Overall transfer level: Needs assistance Equipment used: Rolling walker (2 wheeled) Transfers: Sit to/from Stand Sit to Stand: Mod assist         General transfer comment: modA to power up, verbal cues for hand placement, modA to steady during transition of hands from chair to RW  Ambulation/Gait Ambulation/Gait assistance: Mod assist Gait Distance (Feet): 75 Feet Assistive device: Rolling walker (2 wheeled) Gait Pattern/deviations: Step-through pattern;Decreased stride length;Trunk flexed Gait velocity: slow Gait velocity interpretation: <1.31 ft/sec, indicative of  household ambulator General Gait Details: modA for walker management, max v/c's to stay inside walker, 3 standing rest breaks , short shuffled steps   Stairs             Wheelchair Mobility    Modified Rankin (Stroke Patients Only)       Balance Overall balance assessment: Needs assistance Sitting-balance support: Single extremity supported;Bilateral upper extremity supported;No upper extremity supported Sitting balance-Leahy Scale: Fair     Standing balance support: Bilateral upper extremity supported Standing balance-Leahy Scale: Poor Standing balance comment: external assist                             Cognition Arousal/Alertness: Awake/alert Behavior During Therapy: WFL for tasks assessed/performed Overall Cognitive Status: History of cognitive impairments - at baseline                                 General Comments: dementia at baseline, pt alert, appropriate and co-operative      Exercises      General Comments General comments (skin integrity, edema, etc.): VSS      Pertinent Vitals/Pain Pain Assessment: Faces Faces Pain Scale: No hurt Pain Location: pt denies R shoulder pain     Home Living                      Prior Function            PT Goals (current goals can now be found in the care plan section) Progress  towards PT goals: Progressing toward goals    Frequency    Min 2X/week      PT Plan Current plan remains appropriate    Co-evaluation              AM-PAC PT "6 Clicks" Mobility   Outcome Measure  Help needed turning from your back to your side while in a flat bed without using bedrails?: A Little Help needed moving from lying on your back to sitting on the side of a flat bed without using bedrails?: A Little Help needed moving to and from a bed to a chair (including a wheelchair)?: A Little Help needed standing up from a chair using your arms (e.g., wheelchair or bedside chair)?: A  Little Help needed to walk in hospital room?: A Lot Help needed climbing 3-5 steps with a railing? : Total 6 Click Score: 15    End of Session Equipment Utilized During Treatment: Gait belt Activity Tolerance: Patient tolerated treatment well;Patient limited by fatigue Patient left: in chair;with call bell/phone within reach;with chair alarm set Nurse Communication: Mobility status PT Visit Diagnosis: Unsteadiness on feet (R26.81);Other abnormalities of gait and mobility (R26.89);Muscle weakness (generalized) (M62.81)     Time: 3532-9924 PT Time Calculation (min) (ACUTE ONLY): 21 min  Charges:  $Gait Training: 8-22 mins                     Kittie Plater, PT, DPT Acute Rehabilitation Services Pager #: [phone] Office #: [phone]    [name] [name] 10/24/2020, 1:30 PM

## 2020-10-24 NOTE — Progress Notes (Addendum)
Family Medicine Teaching Service Daily Progress Note Intern Pager: [phone]  Patient name: [name] [name] Medical record number: [id number] Date of birth: [birth year] Age: 86 y.o. Gender: male  Primary Care Provider: Sonia Side., FNP Consultants: Neurosurgery, palliative care  Code Status: DNR  Pt Overview and Major Events to Date:  Admitted 10/23  Assessment and Plan: [name] [name] is a 86 y.o. male presenting with elevated CK and subdural hematoma after being found down after fall while home alone. PMH is significant for permanent atrial fibrillation, indwelling pacemaker, protein calorie malnutrition, hypertension, CHFrEF 25-30%, dementia. Medically stable for discharge.   Goals of care PT/OT recommended wheelchair and SNF. DME order placed. Both patient and daughter are agreeable to SNF and palliative consult to better determine goals of care.  -Appreciate SW assistance, patient received bed placement at [facility], currently awaiting insurance authorization. Reached out to SW regarding updates, appreciate continued involvement and assistance.  -Palliative care consulted, code status changed to DNR. Patient to have outpatient f/u with palliative medicine   Hypotension Episodes of hypotension noted since IV NS fluids discontinued on 10/27. Discontinued home coreg.Standing BP 90/47. Orthostatics: lying 121/71, sitting 111/68 and 115/68. Current BP110/58 when I examined pt this morning.  -continue to hold antihypertensives -monitor BP  Subdural Hematoma, acute  AMS, resolved  Hx of Dementia Repeat CT head demonstrated stable low-attenuation subdural collection measuring up to 25mm in thickness along the left cerebral convexity without significant internal expansion or new hyperdense hemorrhage. No acute intracranial abnormality noted. Recommendation of nonemergent outpatient CT or MRA as clinically warranted. CThead 10/25noted no acute changes. - Neurosurgerysigned  off -Maintainelevated head of bed  Shoulder Pain Likely resolved, denies any current shoulder pain.Seen by OT on 10/26, no evidence of fracture or dislocation. - Tylenol and kpad for pain  -begin gradual weight bearing with sling use for comfort to prevent frozen shoulder, discussed with patient. He agrees and understands.  - refer for outpatient orthopedics follow up   CHFrEF 25-30%, stable  Last echo in 2013 with EF estimate of 25-30%. Home medication includes Coreg 3.125 BID. XR chest consistent with cardiomegaly, patient has pacemaker in place.Most recent weight 67.5kg fromadmissionat 64 kg. - daily weights  - monitor fluid status   AAA Stable.Most recent aortic US demonstrated 5 cm distal abdominal aneurysm noted, recommended follow up with CT/MRI every 6 months along with vascular consultation.Ultrasound in June of 2020 showed aortic measurement is 5.3 cmnoting that the largest aortic diameter remains essentially unchanged compared to prior exam.  - monitor BP with vitals -F/u every 6 months for CT/MRI -F/u vascular outpatient  HTN  BP initially hypertensive up to 174/146on admission, most recently blood pressure110/58. Home meds include Coreg 3.125 BID and ramipril. -holdhome coregdue to hypotension - hold ramipril with small Cr elevation in setting of elevated CK  - monitor BP  Protein Calorie Malnutrition Patient drinking home boosts and on mirtazapine 30mg  qHS.  - continue mirtazapine 30mg  -continue ensure supplements -nutrition consult, appreciate recs and involvement -continue to encourage adequate PO intake -orajel for gum pain as needed  Chronic Constipation  Patient with history of constipation. Reports that at home he has a BM every 3-4 days but when he takes milk of magnesium he has  BM daily.   -scheduled miralax   Atrial Fibrillation Chronic and stable. Most recent HR 63.EKG on admission consistent with atrial  fibrillation. Patient has indwelling pacemaker. Patient on no anticoagulation at home. Home medication includes plavix 75mg  daily.  - hold  plavix in setting of subdural hematoma, hold on discharge - cardiac monitoring   FEN/GI: heart healthy  PPx: SCDs   Status is: Inpatient  Disposition: medically stable for discharge, awaiting SNF placement      Subjective:  No acute overnight events reported. Patient denies any new concerns at this time. Denies chest pain, dyspnea, dizziness and vision changes. Denies any generalized or localized pain. States that he usually has been getting out of bed and walking in the room and sitting in the chair but did not yesterday since no one was present to assist him. States that his gum pain has significantly improved and that he is still eating soft foods.   Objective: Temp:  [97.5 F (36.4 C)-98 F (36.7 C)] 97.7 F (36.5 C) (10/31 2324) Pulse Rate:  [63-87] 63 (10/31 2324) Resp:  [19-20] 20 (10/31 2324) BP: (106-114)/(58-72) 110/58 (11/01 0444) SpO2:  [97 %-100 %] 99 % (10/31 2324) Physical Exam: General: Patient laying comfortably in no acute distress. Cardiovascular: RRR, no murmurs or gallops auscultated  Respiratory: lungs clear to auscultation bilaterally  Abdomen: soft, nontender, presence of active bowel sounds  Extremities: skin warm and dry to touch, no LE edema bilaterally  Neuro: AOx4, very pleasant Psych: mood appropriate   Laboratory: Recent Labs  Lab 10/20/20 0102 10/21/20 0332 10/22/20 0507  WBC 3.6* 3.9* 4.4  HGB 10.6* 10.4* 11.1*  HCT 32.4* 31.3* 34.3*  PLT 206 217 188   Recent Labs  Lab 10/20/20 0102 10/21/20 0332 10/22/20 0507  NA 141 140 139  K 4.9 4.6 5.1  CL 116* 113* 111  CO2 17* 20* 19*  BUN 16 17 17   CREATININE 1.08 1.21 1.25*  CALCIUM 8.4* 8.4* 8.5*  GLUCOSE 89 101* 73      Imaging/Diagnostic Tests: No results found.  Donney Dice, DO 10/24/2020, 6:17 AM PGY-1, [place] Intern pager: [phone], text pages welcome

## 2020-10-25 DIAGNOSIS — S065X9A Traumatic subdural hemorrhage with loss of consciousness of unspecified duration, initial encounter: Secondary | ICD-10-CM | POA: Diagnosis not present

## 2020-10-25 LAB — CBC WITH DIFFERENTIAL/PLATELET
Abs Immature Granulocytes: 0 10*3/uL (ref 0.00–0.07)
Basophils Absolute: 0 10*3/uL (ref 0.0–0.1)
Basophils Relative: 1 %
Eosinophils Absolute: 0.2 10*3/uL (ref 0.0–0.5)
Eosinophils Relative: 6 %
HCT: 35.2 % — ABNORMAL LOW (ref 39.0–52.0)
Hemoglobin: 11.4 g/dL — ABNORMAL LOW (ref 13.0–17.0)
Immature Granulocytes: 0 %
Lymphocytes Relative: 38 %
Lymphs Abs: 1.3 10*3/uL (ref 0.7–4.0)
MCH: 24.1 pg — ABNORMAL LOW (ref 26.0–34.0)
MCHC: 32.4 g/dL (ref 30.0–36.0)
MCV: 74.4 fL — ABNORMAL LOW (ref 80.0–100.0)
Monocytes Absolute: 0.5 10*3/uL (ref 0.1–1.0)
Monocytes Relative: 14 %
Neutro Abs: 1.4 10*3/uL — ABNORMAL LOW (ref 1.7–7.7)
Neutrophils Relative %: 41 %
Platelets: 220 10*3/uL (ref 150–400)
RBC: 4.73 MIL/uL (ref 4.22–5.81)
RDW: 17.4 % — ABNORMAL HIGH (ref 11.5–15.5)
WBC: 3.4 10*3/uL — ABNORMAL LOW (ref 4.0–10.5)
nRBC: 0 % (ref 0.0–0.2)

## 2020-10-25 LAB — BASIC METABOLIC PANEL
Anion gap: 9 (ref 5–15)
BUN: 19 mg/dL (ref 8–23)
CO2: 23 mmol/L (ref 22–32)
Calcium: 8.9 mg/dL (ref 8.9–10.3)
Chloride: 107 mmol/L (ref 98–111)
Creatinine, Ser: 1.1 mg/dL (ref 0.61–1.24)
GFR, Estimated: >60 mL/min (ref >60)
Glucose, Bld: 80 mg/dL (ref 70–99)
Potassium: 4.8 mmol/L (ref 3.5–5.1)
Sodium: 139 mmol/L (ref 135–145)

## 2020-10-25 LAB — RESPIRATORY PANEL BY RT PCR (FLU A&B, COVID)
Influenza A by PCR: NEGATIVE
Influenza B by PCR: NEGATIVE
SARS Coronavirus 2 by RT PCR: NEGATIVE

## 2020-10-25 MED ORDER — SODIUM CHLORIDE 0.9 % IV BOLUS: 500.0000 mL | Freq: Once | INTRAVENOUS | Status: DC

## 2020-10-25 NOTE — TOC Progression Note (Signed)
Transition of Care [facility]) - Progression Note    Patient Details  Name: [name] [name] MRN: [id number] Date of Birth: 1923/01/27  Transition of Care [facility]) CM/SW [place], [place] Phone Number: 10/25/2020, 10:17 AM  Clinical Narrative:     CSW received call from [facility]-they are not in network with [place] informed [facility] supervisor of barrier to discharge.   CSW spoke with the patient's daughter's Hassan Rowan, she has selected another [employer]. [facility], will start insurance authorization today.  CSW will continue to follow and assist with discharge planning.   Thurmond Butts, MSW, [place] Clinical Social Worker   Expected Discharge Plan: Skilled Nursing Facility Barriers to Discharge: Continued Medical Work up  Expected Discharge Plan and Services Expected Discharge Plan: [place] In-house Referral: Clinical Social Work     Living arrangements for the past 2 months: Single Family Home                                       Social Determinants of Health (SDOH) Interventions    Readmission Risk Interventions No flowsheet data found.

## 2020-10-25 NOTE — Progress Notes (Signed)
Family Medicine Teaching Service Daily Progress Note Intern Pager: [phone]  Patient name: [name] [name] Medical record number: [id number] Date of birth: 11/06/1923 Age: 86 y.o. Gender: male  Primary Care Provider: Sonia Side., FNP Consultants: Neurosurgery, palliative care Code Status: DNR  Pt Overview and Major Events to Date:  Admitted 10/23  Assessment and Plan: [name] [name] is a 86 y.o. male presenting with elevated CK and subdural hematoma after being found down after fall while home alone. PMH is significant for permanent atrial fibrillation, indwelling pacemaker, protein calorie malnutrition, hypertension, CHFrEF 25-30%, dementia.Medically stable for discharge.  Goals of care PT/OT recommended wheelchair and SNF. DME order placed. Both patient and daughter are agreeable to SNF and palliative consult to better determine goals of care.  -Appreciate SW assistance, patient received bed placement at [facility], currently awaiting insurance authorization.  -Palliative care consulted, code status changed to DNR. Patient to have outpatient f/u with palliative medicine   Hypotension Episodes of hypotension noted since IV NS fluids discontinued on 10/27. Discontinued home coreg.Standing BP 90/47. Orthostatics: lying 121/71, sitting 111/68 and 115/68. Current BP102/60 when I examined pt this morning. -continue to hold antihypertensives -monitor BP  Subdural Hematoma, acute  AMS, resolved  Hx of Dementia Repeat CT head demonstrated stable low-attenuation subdural collection measuring up to 45mm in thickness along the left cerebral convexity without significant internal expansion or new hyperdense hemorrhage. No acute intracranial abnormality noted. Recommendation of nonemergent outpatient CT or MRA as clinically warranted. CThead 10/25noted no acute changes. - Neurosurgerysigned off -Maintainelevated head of bed  Shoulder Pain Likely resolved, denies any  current shoulder pain.Seen by OT on 10/26, no evidence of fracture or dislocation. Improved passive ROM of both shoulders noted.  - Tylenol and kpad for pain  -begin gradual weight bearing with sling use for comfort to prevent frozen shoulder, discussed with patient. He agrees and understands.  - refer for outpatient orthopedics follow up   CHFrEF 25-30%, stable  Last echo in 2013 with EF estimate of 25-30%. Home medication includes Coreg 3.125 BID. XR chest consistent with cardiomegaly, patient has pacemaker in place.Most recent weight 60.1 kgfromadmissionat 64 kg. - daily weights  - monitor fluid status   AAA Stable.Most recent aortic US demonstrated 5 cm distal abdominal aneurysm noted, recommended follow up with CT/MRI every 6 months along with vascular consultation.Ultrasound in June of 2020 showed aortic measurement is 5.3 cmnoting that the largest aortic diameter remains essentially unchanged compared to prior exam.  - monitor BP with vitals -F/u every 6 months for CT/MRI -F/u vascular outpatient  HTN  Most recent blood pressure102/60. Home meds include Coreg 3.125 BID and ramipril. -holdhome coregdue to hypotension - hold ramipril with small Cr elevation in setting of elevated CK  - monitor BP  Protein Calorie Malnutrition Severe calorie malnutrition, nutrition following and recommended to ensure. Ensure ordered. Patient drinking home boosts and on mirtazapine 30mg  qHS.  - continue mirtazapine 30mg  -continue ensure supplements -nutrition consult, appreciate recs and involvement -continue to encourage adequate PO intake -orajel for gum pain as needed  Chronic Constipation  Patient with history of constipation. Reports that at home he has a BM every 3-4 days but when he takes milk of magnesium he has BM daily.  -scheduled miralax   Atrial Fibrillation Chronic and stable. Most recent [place].EKG on admission consistent with atrial  fibrillation. Patient has indwelling pacemaker. Patient on no anticoagulation at home. Home medication includes plavix 75mg  daily.  - hold plavix in setting of subdural hematoma,  hold on discharge - cardiac monitoring   FEN/GI: heart healthy PPx: SCDs    Disposition: medically stable for discharge, awaiting SNF placement       Subjective:  No acute overnight events reported.   Objective: Temp:  [97.4 F (36.3 C)-99.2 F (37.3 C)] 99.2 F (37.3 C) (11/02 0418) Pulse Rate:  [64-84] 64 (11/02 0415) Resp:  [16-20] 19 (11/02 0415) BP: (92-122)/(55-76) 92/61 (11/02 0415) SpO2:  [98 %-99 %] 98 % (11/02 0415) FiO2 (%):  [4 %] 4 % (11/01 1050) Weight:  [60.1 kg] 60.1 kg (11/02 0418) Physical Exam: General: Patient sleeping in bed comfortably.  Cardiovascular: RRR, no murmurs or gallops auscultated  Respiratory: lungs clear to auscultation bilaterally  Abdomen: soft, nontender, presence of active bowel sounds  Extremities: radial and distal pulses intact bilaterally, no LE edema noted bilaterally  Neuro: easily arousable to voice   Laboratory: Recent Labs  Lab 10/20/20 0102 10/21/20 0332 10/22/20 0507  WBC 3.6* 3.9* 4.4  HGB 10.6* 10.4* 11.1*  HCT 32.4* 31.3* 34.3*  PLT 206 217 188   Recent Labs  Lab 10/21/20 0332 10/22/20 0507 10/24/20 0936  NA 140 139 137  K 4.6 5.1 4.7  CL 113* 111 108  CO2 20* 19* 22  BUN 17 17 17   CREATININE 1.21 1.25* 1.16  CALCIUM 8.4* 8.5* 8.5*  GLUCOSE 101* 73 72      Imaging/Diagnostic Tests: No results found.  Donney Dice, DO 10/25/2020, 6:32 AM PGY-1, [place] Intern pager: [phone], text pages welcome

## 2020-10-25 NOTE — Progress Notes (Signed)
OT Cancellation Note  Patient Details Name: [name] [name] MRN: [id number] DOB: [birth year]   Cancelled Treatment:    Reason Eval/Treat Not Completed: Fatigue/lethargy limiting ability to participate;Other (comment) pt declined OT session stating, "I've had enough of moving around for today." Will check back as time allows for OT session.  Lanier Clam., COTA/L Acute Rehabilitation Services [phone] [place] 10/25/2020, 11:18 AM

## 2020-10-26 DIAGNOSIS — S065X9A Traumatic subdural hemorrhage with loss of consciousness of unspecified duration, initial encounter: Secondary | ICD-10-CM | POA: Diagnosis not present

## 2020-10-26 MED ORDER — MIDODRINE HCL 5 MG PO TABS
2.5000 mg | ORAL_TABLET | Freq: Three times a day (TID) | ORAL | Status: DC
  Administered 2020-10-26 – 2020-10-28 (×8): 2.5 mg via ORAL
  Filled 2020-10-26 (×8): qty 1

## 2020-10-26 NOTE — Progress Notes (Signed)
[facility] ([facility]) Community Based Palliative Care       This patient has been referred to our palliative care services in the community.  [facility] will continue to follow for any discharge planning needs and to coordinate admission onto palliative care after discharge.   If you have questions or need assistance, please call [phone] or contact the hospital Liaison listed on AMION.     Thank you for the opportunity to participate in this patient's care.     Chrislyn King, BSN, RN [facility] Hospital Liaison   [phone]   

## 2020-10-26 NOTE — Progress Notes (Signed)
Dr. Ouida Sills called back and no new order received.

## 2020-10-26 NOTE — Progress Notes (Signed)
1216- Stage 2 pressure ulcer found on lower sacrum. 1x1 cm pink in color. Foam dressing placed. Patient assisted out of bed to chair. MD notified of new finding. No new orders at this time.

## 2020-10-26 NOTE — Progress Notes (Signed)
Physical Therapy Treatment Patient Details Name: [name] [name] MRN: [id number] DOB: 01/05/1923 Today's Date: 10/26/2020    History of Present Illness 86 y.o. male presenting with elevated CK and subdural hematoma after being found down after fall while home alone. PMH is significant for permanent atrial fibrillation, indwelling pacemaker, protein calorie malnutrition, hypertension, CHFrEF 25-30%, dementia.    PT Comments    Pt was sleepy and tired on arrival.  Pt need encouragement to get OOB.  Emphasis on transitions, sit to stand/transfer safety, gait stability/posture/safety.    Follow Up Recommendations  SNF     Equipment Recommendations  None recommended by PT;Other (comment) (TBD next venue)    Recommendations for Other Services       Precautions / Restrictions Precautions Precautions: Fall Restrictions RUE Weight Bearing: Weight bearing as tolerated    Mobility  Bed Mobility Overal bed mobility: Needs Assistance Bed Mobility: Supine to Sit     Supine to sit: Mod assist Sit to supine: Min assist   General bed mobility comments: pt assisted with LE's, but needed truncal and LE assist to pivot and come up and forward.  Transfers Overall transfer level: Needs assistance   Transfers: Sit to/from Stand Sit to Stand: Mod assist         General transfer comment: modA to power up, verbal cues for hand placement, modA to steady during transition of hands from chair to RW  Ambulation/Gait Ambulation/Gait assistance: Mod assist Gait Distance (Feet): 12 Feet (then additional 70 feet with the RW) Assistive device: Rolling walker (2 wheeled) Gait Pattern/deviations: Step-through pattern;Decreased stride length;Trunk flexed Gait velocity: slow Gait velocity interpretation: <1.31 ft/sec, indicative of household ambulator General Gait Details: Again, modA for walker management, max v/c's to stay inside walker, 3 standing rest breaks , short shuffled steps   Stairs              Wheelchair Mobility    Modified Rankin (Stroke Patients Only)       Balance Overall balance assessment: Needs assistance Sitting-balance support: Single extremity supported;Bilateral upper extremity supported;No upper extremity supported Sitting balance-Leahy Scale: Fair Sitting balance - Comments: close guarding for balance on the bed, more stable on firm surface of the toilet.   Standing balance support: Bilateral upper extremity supported Standing balance-Leahy Scale: Poor Standing balance comment: reliant on external assist                             Cognition Arousal/Alertness: Awake/alert Behavior During Therapy: WFL for tasks assessed/performed Overall Cognitive Status: History of cognitive impairments - at baseline                                 General Comments: dementia at baseline, pt alert, appropriate and co-operative      Exercises      General Comments        Pertinent Vitals/Pain Pain Assessment: Faces Faces Pain Scale: No hurt Pain Intervention(s): Monitored during session    Home Living                      Prior Function            PT Goals (current goals can now be found in the care plan section) Acute Rehab PT Goals Patient Stated Goal: To go to rehab to get stronger PT Goal Formulation: With patient Time For Goal Achievement: 10/30/20  Potential to Achieve Goals: Fair Progress towards PT goals: Progressing toward goals    Frequency    Min 2X/week      PT Plan Current plan remains appropriate    Co-evaluation              AM-PAC PT "6 Clicks" Mobility   Outcome Measure  Help needed turning from your back to your side while in a flat bed without using bedrails?: A Little Help needed moving from lying on your back to sitting on the side of a flat bed without using bedrails?: A Lot Help needed moving to and from a bed to a chair (including a wheelchair)?: A Lot Help  needed standing up from a chair using your arms (e.g., wheelchair or bedside chair)?: A Lot Help needed to walk in hospital room?: A Lot Help needed climbing 3-5 steps with a railing? : Total 6 Click Score: 12    End of Session   Activity Tolerance: Patient tolerated treatment well;Patient limited by fatigue Patient left: in bed;with call bell/phone within reach Nurse Communication: Mobility status PT Visit Diagnosis: Unsteadiness on feet (R26.81);Other abnormalities of gait and mobility (R26.89);Muscle weakness (generalized) (M62.81)     Time: 8185-9093 PT Time Calculation (min) (ACUTE ONLY): 28 min  Charges:  $Gait Training: 8-22 mins $Therapeutic Activity: 8-22 mins                     10/26/2020  Ginger Carne., PT Acute Rehabilitation Services [phone]  (pager) [phone]  (office)   Tessie Fass Jazon Jipson 10/26/2020, 6:11 PM

## 2020-10-26 NOTE — Progress Notes (Signed)
Patient had 8 beats of VT and he was asymptomatic on reassessment. Notified Dr. Ouida Sills notified via amnion and awaiting a call back. Will continue to monitor.

## 2020-10-26 NOTE — Progress Notes (Addendum)
Family Medicine Teaching Service Daily Progress Note Intern Pager: [phone]  Patient name: [name] [name] Medical record number: [id number] Date of birth: 1923/09/15 Age: 86 y.o. Gender: male  Primary Care Provider: Sonia Side., FNP Consultants: Neurosurgery, palliative care Code Status: DNR  Pt Overview and Major Events to Date:  Admitted 10/23  Assessment and Plan: [name] [name] is a 86 y.o. male presenting with elevated CK and subdural hematoma after being found down after fall while home alone. PMH is significant for permanent atrial fibrillation, indwelling pacemaker, protein calorie malnutrition, hypertension, CHFrEF 25-30%, dementia.Medically stable for discharge.  Goals of care PT/OT recommended wheelchair and SNF. DME order placed. Both patient and daughter are agreeable to SNF and palliative consult to better determine goals of care.  -Appreciate SW assistance, patient received bed placement at [facility], currently awaiting insurance authorization.  -Palliative care consulted, code status changed to DNR. Patient to have outpatient f/u with palliative medicine  -encouraged more activity, sitting in chair and walking with assistance as needed  Hypotension Episodes of hypotension noted since IV NS fluids discontinued on 10/27. Discontinued home coreg.Standing BP 90/47. Orthostatics: lying 121/71, sitting 111/68 and 115/68. Current BP97/54. -continue to hold antihypertensives -monitor BP, vitals only while awake  -encourage PO hydration -midodrine 2.5 mg tid  -consider fluid bolus if needed   Subdural Hematoma, acute  AMS, resolved  Hx of Dementia Repeat CT head demonstrated stable low-attenuation subdural collection measuring up to 46mm in thickness along the left cerebral convexity without significant internal expansion or new hyperdense hemorrhage. No acute intracranial abnormality noted. Recommendation of nonemergent outpatient CT or MRA as clinically  warranted. CThead 10/25noted no acute changes. - Neurosurgerysigned off -Maintainelevated head of bed  Shoulder Pain Likely resolved, denies any current shoulder pain.Seen by OT on 10/26, no evidence of fracture or dislocation. Improved passive ROM of both shoulders noted.  - Tylenol and kpad for pain  -begin gradual weight bearing with sling use for comfort to prevent frozen shoulder, discussed with patient. He agrees and understands.  - refer for outpatient orthopedics follow up   CHFrEF 25-30% Chronic and stable. No evidence of increased volume status on exam. Last echo in 2013 with EF estimate of 25-30%. Home medication includes Coreg 3.125 BID. XR chest consistent with cardiomegaly, patient has pacemaker in place.Most recent weight 60.1 kgfromadmissionat 64 kg. - daily weights  - monitor fluid status   AAA Stable.Most recent aortic US demonstrated 5 cm distal abdominal aneurysm noted, recommended follow up with CT/MRI every 6 months along with vascular consultation.Ultrasound in June of 2020 showed aortic measurement is 5.3 cmnoting that the largest aortic diameter remains essentially unchanged compared to prior exam.  - monitor BP with vitals -F/u every 6 months for CT/MRI -F/u vascular outpatient  HTN  Most recent blood pressure97/54. Home meds include Coreg 3.125 BID and ramipril. -holdhome coregdue to hypotension - hold ramipril with small Cr elevation in setting of elevated CK  - monitor BP  Protein Calorie Malnutrition Severe calorie malnutrition, nutrition following and recommended to ensure. Ensure ordered. Patient drinking home boosts and on mirtazapine 30mg  qHS.  - continue mirtazapine 30mg  -continue ensure supplements -nutrition consult, appreciate recs and involvement -continue to encourage adequate PO intakeand hydration  Chronic Constipation  Patient with history of constipation. Reports that at home he has a BM every 3-4  days but when he takes milk of magnesium he has BM daily.  -scheduled miralax   Atrial Fibrillation Chronic and stable.Most recentHR64.EKG on admission consistent with  atrial fibrillation. Patient has indwelling pacemaker. Patient on no anticoagulationat home. Home medication includes plavix 75mg  daily.  - hold plavix in setting of subdural hematoma, hold on discharge   FEN/GI: heart healthy  PPx: SCDs   Disposition: medically stable for discharge pending SNF placement         Subjective:  Denies any concerns at this time. Conveyed to nurse to assist patient with getting up and moving more.   Objective: Temp:  [98 F (36.7 C)-98.4 F (36.9 C)] 98.1 F (36.7 C) (11/02 2338) Pulse Rate:  [64-67] 64 (11/03 0500) Resp:  [10-24] 13 (11/03 0500) BP: (92-109)/(51-62) 97/54 (11/03 0400) SpO2:  [98 %-100 %] 99 % (11/03 0500) Physical Exam: General: Patient sitting upright eating breakfast, in no acute distress. Cardiovascular: RRR, no murmurs or gallops auscultated  Respiratory: lungs clear to auscultation bilaterally, good air movement throughout all lung fields  Abdomen: soft, nontender, presence of active bowel sounds Extremities: no LE edema noted bilaterally, radial and distal pulses strong and equal bilaterally   Laboratory: Recent Labs  Lab 10/21/20 0332 10/22/20 0507 10/25/20 0621  WBC 3.9* 4.4 3.4*  HGB 10.4* 11.1* 11.4*  HCT 31.3* 34.3* 35.2*  PLT 217 188 220   Recent Labs  Lab 10/22/20 0507 10/24/20 0936 10/25/20 0621  NA 139 137 139  K 5.1 4.7 4.8  CL 111 108 107  CO2 19* 22 23  BUN 17 17 19   CREATININE 1.25* 1.16 1.10  CALCIUM 8.5* 8.5* 8.9  GLUCOSE 73 72 80      Imaging/Diagnostic Tests: No results found.  Donney Dice, DO 10/26/2020, 5:41 AM PGY-1, [place] Intern pager: [phone], text pages welcome

## 2020-10-27 DIAGNOSIS — S065X9A Traumatic subdural hemorrhage with loss of consciousness of unspecified duration, initial encounter: Secondary | ICD-10-CM | POA: Diagnosis not present

## 2020-10-27 NOTE — TOC Progression Note (Signed)
Transition of Care [facility]) - Progression Note    Patient Details  Name: [name] [name] MRN: [id number] Date of Birth: [birth year]  Transition of Care [facility]) CM/SW [place], [place] Phone Number: 10/27/2020, 10:37 AM  Clinical Narrative:     Authorization w/[facility] still pending.  Sent message to Chris/[facility] to follow up on the status.CSW waiting on response-  Thurmond Butts, MSW, [place] Clinical Social Worker   Expected Discharge Plan: Skilled Nursing Facility Barriers to Discharge: Continued Medical Work up  Expected Discharge Plan and Services Expected Discharge Plan: [place] In-house Referral: Clinical Social Work     Living arrangements for the past 2 months: Single Family Home                                       Social Determinants of Health (SDOH) Interventions    Readmission Risk Interventions No flowsheet data found.

## 2020-10-27 NOTE — Progress Notes (Signed)
Family Medicine Teaching Service Daily Progress Note Intern Pager: [phone]  Patient name: [name] [name] Medical record number: [id number] Date of birth: May 03, 1923 Age: 86 y.o. Gender: male  Primary Care Provider: Sonia Side., FNP Consultants: Neurosurgery, palliative care Code Status: DNR  Pt Overview and Major Events to Date:  Admitted 10/23  Assessment and Plan: [name] [name] is a 86 y.o. male presenting with elevated CK and subdural hematoma after being found down after fall while home alone. PMH is significant for permanent atrial fibrillation, indwelling pacemaker, protein calorie malnutrition, hypertension, CHFrEF 25-30%, dementia.Medically stable for discharge.  Goals of care PT/OT recommended wheelchair and SNF. DME order placed. Both patient and daughter are agreeable to SNF and palliative consult to better determine goals of care.  -Appreciate SW assistance, patient received bed placement at [facility], currently awaiting insurance authorization.  -Palliative care consulted, code status changed to DNR. Patient to have outpatient f/u with palliative medicine  -encouraged more activity, continue sitting in chair and walking with assistance as needed  Hypotension Episodes of hypotension noted since IV NS fluids discontinued on 10/27. Discontinued home coreg.Standing BP 90/47. Orthostatics: lying 121/71, sitting 111/68 and 115/68. Current BP105/62. -continue to hold antihypertensives -monitor BP, vitals only while awake  -encourage PO hydration -midodrine 2.5 mg tid   Subdural Hematoma, acute  AMS, resolved  Hx of Dementia Repeat CT head demonstrated stable low-attenuation subdural collection measuring up to 8mm in thickness along the left cerebral convexity without significant internal expansion or new hyperdense hemorrhage. No acute intracranial abnormality noted. Recommendation of nonemergent outpatient CT or MRA as clinically warranted. CThead  10/25noted no acute changes. - Neurosurgerysigned off -Maintainelevated head of bed  Shoulder Pain Likely resolved, denies any current shoulder pain.Seen by OT on 10/26, no evidence of fracture or dislocation.Improved passive ROM of both shoulders noted. - Tylenol and kpad for pain  -begin gradual weight bearing with sling use for comfort to prevent frozen shoulder, discussed with patient. He agrees and understands.  - refer for outpatient orthopedics follow up   CHFrEF 25-30% Chronic and stable. No evidence of increased volume status on exam. Last echo in 2013 with EF estimate of 25-30%. Home medication includes Coreg 3.125 BID. XR chest consistent with cardiomegaly, patient has pacemaker in place.Most recent weight60.2kgfromadmissionat 64 kg. - daily weights  - monitor fluid status   AAA Stable.Most recent aortic US demonstrated 5 cm distal abdominal aneurysm noted, recommended follow up with CT/MRI every 6 months along with vascular consultation.Ultrasound in June of 2020 showed aortic measurement is 5.3 cmnoting that the largest aortic diameter remains essentially unchanged compared to prior exam.  - monitor BP with vitals -F/u every 6 months for CT/MRI -F/u vascular outpatient  HTN Most recent blood pressure105/62. Home meds include Coreg 3.125 BID and ramipril. -holdhome coregdue to hypotension - hold ramipril with small Cr elevation in setting of elevated CK  - monitor BP  Sacral Pressure Ulcer Examined with chaperone. Less than 1 cm sacral ulcer noted. No erythema or discharge noted from area. Appears to be in the healing process.  -encouraged frequent position changes and activity to avoid worsening ulcer and prevention of future ulcer.   Protein Calorie Malnutrition Severe calorie malnutrition, nutrition following and recommended to ensure. Ensure ordered.Patient drinking home boosts and on mirtazapine 30mg  qHS.  - continue mirtazapine  30mg  -continue ensure supplements -nutrition consult, appreciate recs and involvement -continue to encourage adequate PO intakeand hydration  Chronic Constipation  Patient with history of constipation. Reports that at  home he has a BM every 3-4 days but when he takes milk of magnesium he has BM daily.  -scheduled miralax   Atrial Fibrillation Chronic and stable.Most recentHR67.EKG on admission consistent with atrial fibrillation. Patient has indwelling pacemaker. Patient on no anticoagulationat home. Home medication includes plavix 75mg  daily.  - hold plavix in setting of subdural hematoma, hold on discharge   FEN/GI: heart healthy PPx: SCDs    Disposition: medically stable for discharge, pending SNF placement        Subjective:  No acute overnight events reported.   Objective: Temp:  [97.6 F (36.4 C)-98.8 F (37.1 C)] 98.1 F (36.7 C) (11/04 0345) Pulse Rate:  [61-83] 61 (11/04 0515) Resp:  [16-31] 25 (11/04 0515) BP: (92-114)/(58-73) 105/62 (11/04 0346) SpO2:  [95 %-100 %] 97 % (11/04 0515) Physical Exam: General: Patient laying in bed comfortably, in no acute distress. Cardiovascular: RRR, no murmurs or gallops auscultated  Respiratory: lungs clear to auscultation bilaterally, breathing comfortably on room air Abdomen: soft, nontender, presence of active bowel sounds Derm: less than 1 cm pressure ulcer, skin warm and dry to touch, no other new rashes or lesions noted  Extremities: radial and distal pulses strong and equal bilaterally Neuro: AOx4, very pleasant Psych: mood appropriate   Laboratory: Recent Labs  Lab 10/21/20 0332 10/22/20 0507 10/25/20 0621  WBC 3.9* 4.4 3.4*  HGB 10.4* 11.1* 11.4*  HCT 31.3* 34.3* 35.2*  PLT 217 188 220   Recent Labs  Lab 10/22/20 0507 10/24/20 0936 10/25/20 0621  NA 139 137 139  K 5.1 4.7 4.8  CL 111 108 107  CO2 19* 22 23  BUN 17 17 19   CREATININE 1.25* 1.16 1.10  CALCIUM 8.5* 8.5* 8.9   GLUCOSE 73 72 80     Imaging/Diagnostic Tests: No results found.  Donney Dice, DO 10/27/2020, 6:26 AM PGY-1, [place] Intern pager: [phone], text pages welcome

## 2020-10-27 NOTE — Progress Notes (Signed)
Nutrition Follow-up  DOCUMENTATION CODES:   Severe malnutrition in context of chronic illness, Underweight  INTERVENTION:  Continue Ensure Enlive po TID, each supplement provides 350 kcal and 20 grams of protein.  Encourage adequate PO intake.   NUTRITION DIAGNOSIS:   Severe Malnutrition related to chronic illness (CHF) as evidenced by estimated needs; ongoing  GOAL:   Patient will meet greater than or equal to 90% of their needs; progressing  MONITOR:   PO intake, Supplement acceptance, Skin, Weight trends, Labs, I & O's  REASON FOR ASSESSMENT:   Consult Assessment of nutrition requirement/status  ASSESSMENT:   86 y.o. male presenting with elevated CK and subdural hematoma after being found down after fall while home alone. PMH is significant for permanent atrial fibrillation, indwelling pacemaker, protein calorie malnutrition, hypertension, CHFrEF 25-30%, dementia.  Meal completion has been varied from 25-90%. Pt currently has Ensure ordered and has been consuming them. RD to continue with current orders to aid in caloric and protein needs. Pt encouraged to eat his food at meals and to drink his nutritional supplements. Awaiting SNF placement.   Labs and medications reviewed.  Diet Order:   Diet Order            DIET DYS 2 Room service appropriate? Yes; Fluid consistency: Thin  Diet effective now                 EDUCATION NEEDS:   Not appropriate for education at this time  Skin:  Skin Assessment: Skin Integrity Issues: Skin Integrity Issues:: Stage II Stage II: sacrum  Last BM:  10/28  Height:   Ht Readings from Last 1 Encounters:  10/15/20 6\' 1"  (1.854 m)    Weight:   Wt Readings from Last 1 Encounters:  10/26/20 60.2 kg   BMI:  Body mass index is 17.51 kg/m.  Estimated Nutritional Needs:   Kcal:  1650-1850  Protein:  80-90 grams  Fluid:  >/= 1.7 L/day  Corrin Parker, MS, RD, LDN RD pager number/after hours weekend pager number on  Amion.

## 2020-10-27 NOTE — Progress Notes (Signed)
OT Cancellation Note  Patient Details Name: [name] [name] MRN: [id number] DOB: 1923-02-08   Cancelled Treatment:    [name] Eval/Treat Not Completed: Fatigue/lethargy limiting ability to participate;Other (comment). Pt resting very comfortably in bed, reports he just got back to bed after sitting up for 3 hours. Politely declines therapy at this time. Plan to reattempt, likely tomorrow.  Tyrone Schimke, OT Acute Rehabilitation Services Pager: [phone] Office: [phone]  10/27/2020, 1:22 PM

## 2020-10-28 DIAGNOSIS — I4891 Unspecified atrial fibrillation: Secondary | ICD-10-CM

## 2020-10-28 DIAGNOSIS — L8990 Pressure ulcer of unspecified site, unspecified stage: Secondary | ICD-10-CM | POA: Insufficient documentation

## 2020-10-28 MED ORDER — MIDODRINE HCL 2.5 MG PO TABS: 2.5000 mg | ORAL_TABLET | Freq: Three times a day (TID) | ORAL | 0 refills | Status: AC

## 2020-10-28 MED ORDER — POLYETHYLENE GLYCOL 3350 17 G PO PACK: 17.0000 g | PACK | Freq: Every day | ORAL | 0 refills | Status: AC

## 2020-10-28 MED ORDER — ENSURE ENLIVE PO LIQD: 237.0000 mL | Freq: Three times a day (TID) | ORAL | 12 refills | Status: AC

## 2020-10-28 NOTE — TOC Transition Note (Signed)
Transition of Care [facility]) - CM/SW Discharge Note   Patient Details  Name: [name] [name] MRN: [id number] Date of Birth: [birth year]  Transition of Care [facility]) CM/SW Contact:  Vinie Sill, [place] Phone Number: 10/28/2020, 2:15 PM   Clinical Narrative:     Patient will DC to: [facility]  DC Date: 10/28/2020 Family Notified:Brenda,daughter Transport [place]:BRKV  Per MD patient is ready for discharge. RN, patient, and facility notified of DC. Discharge Summary sent to facility. RN given number for [phone],Room 2pm. Ambulance transport requested for patient.   Clinical Social Worker signing off.  Thurmond Butts, MSW, [place] Clinical Social Worker    Final next level of care: Skilled Nursing Facility Barriers to Discharge: Barriers Resolved   Patient Goals and CMS Choice        Discharge Placement              Patient chooses bed at: [facility] [place] Patient to be transferred to facility by: [place] Name of family member notified: Brenda,daugter Patient and family notified of of transfer: 10/28/20  Discharge Plan and Services In-house Referral: Clinical Social Work                                   Social Determinants of Health ([place]) Interventions     Readmission Risk Interventions No flowsheet data found.

## 2020-10-28 NOTE — TOC Progression Note (Signed)
Transition of Care [facility]) - Progression Note    Patient Details  Name: Brayson Livesey MRN: [id number] Date of Birth: 04/02/1923  Transition of Care [facility]) CM/SW [place], [place] Phone Number: 10/28/2020, 10:57 AM  Clinical Narrative:     CSW informed by SNF/[facility], they have received [place], MSW, [place] Clinical Social Worker   Expected Discharge Plan: [place] Barriers to Discharge: Continued Medical Work up  Expected Discharge Plan and Services Expected Discharge Plan: [place] In-house Referral: Clinical Social Work     Living arrangements for the past 2 months: Single Family Home                                       Social Determinants of Health (SDOH) Interventions    Readmission Risk Interventions No flowsheet data found.

## 2020-10-28 NOTE — Progress Notes (Signed)
Report called to Riley Lam at [facility]. IV removed all of patients belongings packed and at the bedside. Patient and daughter informed of transfer. All questions answered.

## 2020-10-28 NOTE — Progress Notes (Signed)
[facility] came and picked a patient. [facility] called and notified. Patient discharged via a stretcher with [facility].

## 2021-01-24 DEATH — deceased
